# Patient Record
Sex: Female | Born: 1950 | Race: White | Hispanic: No | Smoking: Never smoker
Health system: Southern US, Community
[De-identification: ages and names within clinical notes are randomized; demographics above are authoritative.]

## PROBLEM LIST (undated history)

## (undated) DIAGNOSIS — E785 Hyperlipidemia, unspecified: Secondary | ICD-10-CM

## (undated) DIAGNOSIS — H9190 Unspecified hearing loss, unspecified ear: Secondary | ICD-10-CM

## (undated) DIAGNOSIS — I10 Essential (primary) hypertension: Secondary | ICD-10-CM

## (undated) DIAGNOSIS — K635 Polyp of colon: Secondary | ICD-10-CM

## (undated) HISTORY — DX: Hyperlipidemia, unspecified: E78.5

## (undated) HISTORY — DX: Unspecified hearing loss, unspecified ear: H91.90

## (undated) HISTORY — DX: Essential (primary) hypertension: I10

## (undated) HISTORY — DX: Polyp of colon: K63.5

## (undated) HISTORY — PX: TONSILLECTOMY: SUR1361

## (undated) HISTORY — PX: ABDOMINAL HYSTERECTOMY: SHX81

---

## 1998-12-27 ENCOUNTER — Other Ambulatory Visit: Admission: RE | Admit: 1998-12-27 | Discharge: 1998-12-27 | Payer: Self-pay | Admitting: Family Medicine

## 1999-12-17 ENCOUNTER — Encounter: Admission: RE | Admit: 1999-12-17 | Discharge: 1999-12-17 | Payer: Self-pay | Admitting: Family Medicine

## 1999-12-17 ENCOUNTER — Encounter: Payer: Self-pay | Admitting: Family Medicine

## 2001-01-01 ENCOUNTER — Other Ambulatory Visit: Admission: RE | Admit: 2001-01-01 | Discharge: 2001-01-01 | Payer: Self-pay | Admitting: *Deleted

## 2001-01-01 ENCOUNTER — Other Ambulatory Visit: Admission: RE | Admit: 2001-01-01 | Discharge: 2001-01-01 | Payer: Self-pay | Admitting: Family Medicine

## 2002-02-17 ENCOUNTER — Encounter: Admission: RE | Admit: 2002-02-17 | Discharge: 2002-02-17 | Payer: Self-pay | Admitting: Family Medicine

## 2002-02-17 ENCOUNTER — Encounter: Payer: Self-pay | Admitting: Family Medicine

## 2003-02-21 ENCOUNTER — Encounter: Payer: Self-pay | Admitting: Family Medicine

## 2003-02-21 ENCOUNTER — Encounter: Admission: RE | Admit: 2003-02-21 | Discharge: 2003-02-21 | Payer: Self-pay | Admitting: Family Medicine

## 2004-02-22 ENCOUNTER — Encounter: Admission: RE | Admit: 2004-02-22 | Discharge: 2004-02-22 | Payer: Self-pay | Admitting: Family Medicine

## 2004-03-13 ENCOUNTER — Other Ambulatory Visit: Admission: RE | Admit: 2004-03-13 | Discharge: 2004-03-13 | Payer: Self-pay | Admitting: Family Medicine

## 2004-06-13 ENCOUNTER — Observation Stay (HOSPITAL_COMMUNITY): Admission: RE | Admit: 2004-06-13 | Discharge: 2004-06-14 | Payer: Self-pay | Admitting: Neurosurgery

## 2004-07-05 HISTORY — PX: CERVICAL FUSION: SHX112

## 2005-01-28 ENCOUNTER — Ambulatory Visit: Payer: Self-pay | Admitting: Family Medicine

## 2005-02-22 ENCOUNTER — Encounter: Admission: RE | Admit: 2005-02-22 | Discharge: 2005-02-22 | Payer: Self-pay | Admitting: Family Medicine

## 2005-03-14 ENCOUNTER — Ambulatory Visit: Payer: Self-pay | Admitting: Family Medicine

## 2005-04-03 ENCOUNTER — Encounter: Payer: Self-pay | Admitting: Cardiology

## 2005-04-03 ENCOUNTER — Ambulatory Visit: Payer: Self-pay

## 2005-08-05 ENCOUNTER — Ambulatory Visit: Payer: Self-pay | Admitting: Family Medicine

## 2005-08-05 ENCOUNTER — Ambulatory Visit: Payer: Self-pay | Admitting: Cardiology

## 2005-08-26 ENCOUNTER — Ambulatory Visit: Payer: Self-pay | Admitting: Family Medicine

## 2005-11-21 ENCOUNTER — Ambulatory Visit: Payer: Self-pay | Admitting: Family Medicine

## 2006-02-25 ENCOUNTER — Encounter: Admission: RE | Admit: 2006-02-25 | Discharge: 2006-02-25 | Payer: Self-pay | Admitting: Family Medicine

## 2006-03-06 ENCOUNTER — Encounter: Admission: RE | Admit: 2006-03-06 | Discharge: 2006-03-06 | Payer: Self-pay | Admitting: Family Medicine

## 2006-03-17 ENCOUNTER — Ambulatory Visit: Payer: Self-pay | Admitting: Family Medicine

## 2006-03-26 ENCOUNTER — Encounter: Payer: Self-pay | Admitting: Internal Medicine

## 2006-03-26 ENCOUNTER — Ambulatory Visit: Payer: Self-pay

## 2006-09-22 ENCOUNTER — Ambulatory Visit: Payer: Self-pay | Admitting: Family Medicine

## 2006-09-22 LAB — CONVERTED CEMR LAB
Eosinophil percent: 3.3 % (ref 0.0–5.0)
Hemoglobin: 14.6 g/dL (ref 12.0–15.0)
Monocytes Absolute: 1 10*3/uL — ABNORMAL HIGH (ref 0.2–0.7)
Neutrophils Relative %: 58.6 % (ref 43.0–77.0)
WBC: 12.4 10*3/uL — ABNORMAL HIGH (ref 4.5–10.5)

## 2006-10-02 ENCOUNTER — Ambulatory Visit: Payer: Self-pay | Admitting: Hematology & Oncology

## 2006-10-20 LAB — CBC WITH DIFFERENTIAL/PLATELET
Basophils Absolute: 0 10*3/uL (ref 0.0–0.1)
Eosinophils Absolute: 0.3 10*3/uL (ref 0.0–0.5)
HCT: 40.8 % (ref 34.8–46.6)
HGB: 13.8 g/dL (ref 11.6–15.9)
LYMPH%: 24.5 % (ref 14.0–48.0)
MCV: 92.8 fL (ref 81.0–101.0)
MONO#: 0.8 10*3/uL (ref 0.1–0.9)
NEUT#: 9 10*3/uL — ABNORMAL HIGH (ref 1.5–6.5)
Platelets: 310 10*3/uL (ref 145–400)
RBC: 4.4 10*6/uL (ref 3.70–5.32)
WBC: 13.4 10*3/uL — ABNORMAL HIGH (ref 3.9–10.0)

## 2006-10-20 LAB — CHCC SMEAR

## 2006-12-17 ENCOUNTER — Ambulatory Visit: Payer: Self-pay | Admitting: Hematology & Oncology

## 2007-01-01 LAB — CBC WITH DIFFERENTIAL/PLATELET
Basophils Absolute: 0.1 10*3/uL (ref 0.0–0.1)
EOS%: 2.4 % (ref 0.0–7.0)
HCT: 40.6 % (ref 34.8–46.6)
HGB: 14.1 g/dL (ref 11.6–15.9)
LYMPH%: 26 % (ref 14.0–48.0)
MCH: 31.2 pg (ref 26.0–34.0)
MONO#: 0.8 10*3/uL (ref 0.1–0.9)
NEUT%: 65.1 % (ref 39.6–76.8)
Platelets: 301 10*3/uL (ref 145–400)
lymph#: 3.5 10*3/uL — ABNORMAL HIGH (ref 0.9–3.3)

## 2007-02-27 ENCOUNTER — Encounter: Admission: RE | Admit: 2007-02-27 | Discharge: 2007-02-27 | Payer: Self-pay | Admitting: Family Medicine

## 2007-04-03 DIAGNOSIS — R51 Headache: Secondary | ICD-10-CM

## 2007-04-03 DIAGNOSIS — R519 Headache, unspecified: Secondary | ICD-10-CM | POA: Insufficient documentation

## 2007-04-03 DIAGNOSIS — I10 Essential (primary) hypertension: Secondary | ICD-10-CM | POA: Insufficient documentation

## 2007-04-03 DIAGNOSIS — L259 Unspecified contact dermatitis, unspecified cause: Secondary | ICD-10-CM

## 2007-04-07 ENCOUNTER — Ambulatory Visit: Payer: Self-pay | Admitting: Family Medicine

## 2007-04-07 DIAGNOSIS — D72829 Elevated white blood cell count, unspecified: Secondary | ICD-10-CM | POA: Insufficient documentation

## 2007-04-07 DIAGNOSIS — E7210 Disorders of sulfur-bearing amino-acid metabolism, unspecified: Secondary | ICD-10-CM

## 2007-04-07 DIAGNOSIS — H9190 Unspecified hearing loss, unspecified ear: Secondary | ICD-10-CM | POA: Insufficient documentation

## 2007-04-07 DIAGNOSIS — C44599 Other specified malignant neoplasm of skin of other part of trunk: Secondary | ICD-10-CM | POA: Insufficient documentation

## 2007-04-07 DIAGNOSIS — F329 Major depressive disorder, single episode, unspecified: Secondary | ICD-10-CM

## 2007-04-14 ENCOUNTER — Ambulatory Visit: Payer: Self-pay | Admitting: Family Medicine

## 2007-04-17 LAB — CONVERTED CEMR LAB
BUN: 9 mg/dL (ref 6–23)
Basophils Relative: 1 % (ref 0.0–1.0)
Chloride: 102 meq/L (ref 96–112)
Eosinophils Absolute: 0.3 10*3/uL (ref 0.0–0.6)
Eosinophils Relative: 2.9 % (ref 0.0–5.0)
GFR calc Af Amer: 96 mL/min
HDL: 36.5 mg/dL — ABNORMAL LOW (ref >39.0)
LDL Cholesterol: 97 mg/dL (ref 0–99)
Lymphocytes Relative: 22.5 % (ref 12.0–46.0)
MCV: 92.4 fL (ref 78.0–100.0)
Neutro Abs: 7.7 10*3/uL (ref 1.4–7.7)
Neutrophils Relative %: 67.6 % (ref 43.0–77.0)
Platelets: 226 10*3/uL (ref 150–400)
RBC: 4.54 M/uL (ref 3.87–5.11)
Sodium: 142 meq/L (ref 135–145)
Total Bilirubin: 0.8 mg/dL (ref 0.3–1.2)
Total CHOL/HDL Ratio: 4.6

## 2007-04-30 ENCOUNTER — Telehealth: Payer: Self-pay | Admitting: Family Medicine

## 2007-06-28 ENCOUNTER — Ambulatory Visit: Payer: Self-pay | Admitting: Hematology & Oncology

## 2007-10-08 ENCOUNTER — Ambulatory Visit: Payer: Self-pay | Admitting: Family Medicine

## 2007-10-12 LAB — CONVERTED CEMR LAB
ALT: 20 units/L (ref 0–35)
Albumin: 3.5 g/dL (ref 3.5–5.2)
BUN: 9 mg/dL (ref 6–23)
Calcium: 9.5 mg/dL (ref 8.4–10.5)
Eosinophils Absolute: 0.3 10*3/uL (ref 0.0–0.6)
HCT: 42.1 % (ref 36.0–46.0)
HDL: 40.8 mg/dL (ref >39.0)
LDL Cholesterol: 111 mg/dL — ABNORMAL HIGH (ref 0–99)
Lymphocytes Relative: 26.5 % (ref 12.0–46.0)
Neutro Abs: 7.3 10*3/uL (ref 1.4–7.7)
Neutrophils Relative %: 63.8 % (ref 43.0–77.0)
Platelets: 225 10*3/uL (ref 150–400)
Potassium: 4.8 meq/L (ref 3.5–5.1)
RBC: 4.52 M/uL (ref 3.87–5.11)
Sodium: 144 meq/L (ref 135–145)
Total CHOL/HDL Ratio: 4.4
Triglycerides: 141 mg/dL (ref 0–149)
WBC: 11.3 10*3/uL — ABNORMAL HIGH (ref 4.5–10.5)

## 2007-11-09 ENCOUNTER — Telehealth (INDEPENDENT_AMBULATORY_CARE_PROVIDER_SITE_OTHER): Payer: Self-pay | Admitting: *Deleted

## 2007-11-24 ENCOUNTER — Ambulatory Visit: Payer: Self-pay | Admitting: Family Medicine

## 2007-11-24 DIAGNOSIS — J209 Acute bronchitis, unspecified: Secondary | ICD-10-CM

## 2007-12-11 ENCOUNTER — Ambulatory Visit: Payer: Self-pay | Admitting: Family Medicine

## 2007-12-11 DIAGNOSIS — J0110 Acute frontal sinusitis, unspecified: Secondary | ICD-10-CM

## 2008-02-29 ENCOUNTER — Encounter: Admission: RE | Admit: 2008-02-29 | Discharge: 2008-02-29 | Payer: Self-pay | Admitting: Family Medicine

## 2008-03-01 ENCOUNTER — Encounter (INDEPENDENT_AMBULATORY_CARE_PROVIDER_SITE_OTHER): Payer: Self-pay | Admitting: *Deleted

## 2008-05-04 ENCOUNTER — Ambulatory Visit: Payer: Self-pay | Admitting: Family Medicine

## 2008-05-05 ENCOUNTER — Encounter: Payer: Self-pay | Admitting: Family Medicine

## 2008-05-05 ENCOUNTER — Encounter (INDEPENDENT_AMBULATORY_CARE_PROVIDER_SITE_OTHER): Payer: Self-pay | Admitting: *Deleted

## 2008-05-05 LAB — CONVERTED CEMR LAB
Basophils Relative: 0.5 % (ref 0.0–1.0)
Bilirubin, Direct: 0.1 mg/dL (ref 0.0–0.3)
CO2: 27 meq/L (ref 19–32)
Eosinophils Absolute: 0.3 10*3/uL (ref 0.0–0.7)
GFR calc non Af Amer: 93 mL/min
HCT: 41 % (ref 36.0–46.0)
HDL: 38.7 mg/dL — ABNORMAL LOW (ref >39.0)
Hemoglobin: 14.1 g/dL (ref 12.0–15.0)
Lymphocytes Relative: 28 % (ref 12.0–46.0)
MCHC: 34.3 g/dL (ref 30.0–36.0)
Monocytes Absolute: 0.6 10*3/uL (ref 0.1–1.0)
Monocytes Relative: 6.3 % (ref 3.0–12.0)
Neutrophils Relative %: 62.4 % (ref 43.0–77.0)
Potassium: 4.2 meq/L (ref 3.5–5.1)
RDW: 12.3 % (ref 11.5–14.6)
TSH: 2.11 microintl units/mL (ref 0.35–5.50)
Total Bilirubin: 0.8 mg/dL (ref 0.3–1.2)
Total Protein: 6.6 g/dL (ref 6.0–8.3)
Vit D, 1,25-Dihydroxy: 34 (ref 30–89)

## 2008-08-26 ENCOUNTER — Encounter: Payer: Self-pay | Admitting: Family Medicine

## 2008-08-26 DIAGNOSIS — M79609 Pain in unspecified limb: Secondary | ICD-10-CM | POA: Insufficient documentation

## 2008-09-08 ENCOUNTER — Encounter: Payer: Self-pay | Admitting: Family Medicine

## 2008-09-12 ENCOUNTER — Telehealth (INDEPENDENT_AMBULATORY_CARE_PROVIDER_SITE_OTHER): Payer: Self-pay | Admitting: *Deleted

## 2008-09-14 ENCOUNTER — Ambulatory Visit: Payer: Self-pay | Admitting: Family Medicine

## 2008-10-20 ENCOUNTER — Ambulatory Visit: Payer: Self-pay | Admitting: Family Medicine

## 2008-10-20 DIAGNOSIS — R609 Edema, unspecified: Secondary | ICD-10-CM

## 2008-11-01 ENCOUNTER — Telehealth (INDEPENDENT_AMBULATORY_CARE_PROVIDER_SITE_OTHER): Payer: Self-pay | Admitting: *Deleted

## 2009-01-11 ENCOUNTER — Encounter (INDEPENDENT_AMBULATORY_CARE_PROVIDER_SITE_OTHER): Payer: Self-pay | Admitting: *Deleted

## 2009-01-16 ENCOUNTER — Ambulatory Visit: Payer: Self-pay | Admitting: Family Medicine

## 2009-01-16 DIAGNOSIS — J019 Acute sinusitis, unspecified: Secondary | ICD-10-CM | POA: Insufficient documentation

## 2009-03-01 ENCOUNTER — Encounter: Admission: RE | Admit: 2009-03-01 | Discharge: 2009-03-01 | Payer: Self-pay | Admitting: Family Medicine

## 2009-03-14 ENCOUNTER — Ambulatory Visit: Payer: Self-pay | Admitting: Family Medicine

## 2009-03-14 DIAGNOSIS — F411 Generalized anxiety disorder: Secondary | ICD-10-CM

## 2009-05-16 ENCOUNTER — Ambulatory Visit: Payer: Self-pay | Admitting: Family Medicine

## 2009-05-16 LAB — CONVERTED CEMR LAB
Blood in Urine, dipstick: NEGATIVE
Glucose, Urine, Semiquant: NEGATIVE
Ketones, urine, test strip: NEGATIVE
Nitrite: NEGATIVE
Specific Gravity, Urine: >=1.03
WBC Urine, dipstick: NEGATIVE
pH: 5

## 2009-05-23 ENCOUNTER — Encounter (INDEPENDENT_AMBULATORY_CARE_PROVIDER_SITE_OTHER): Payer: Self-pay | Admitting: *Deleted

## 2009-05-26 ENCOUNTER — Ambulatory Visit: Payer: Self-pay | Admitting: Internal Medicine

## 2009-05-29 ENCOUNTER — Encounter (INDEPENDENT_AMBULATORY_CARE_PROVIDER_SITE_OTHER): Payer: Self-pay | Admitting: *Deleted

## 2009-05-30 ENCOUNTER — Encounter: Payer: Self-pay | Admitting: Family Medicine

## 2009-07-20 ENCOUNTER — Encounter: Payer: Self-pay | Admitting: Family Medicine

## 2010-02-06 ENCOUNTER — Ambulatory Visit: Payer: Self-pay | Admitting: Family Medicine

## 2010-02-06 DIAGNOSIS — E1140 Type 2 diabetes mellitus with diabetic neuropathy, unspecified: Secondary | ICD-10-CM | POA: Insufficient documentation

## 2010-02-06 DIAGNOSIS — E1151 Type 2 diabetes mellitus with diabetic peripheral angiopathy without gangrene: Secondary | ICD-10-CM

## 2010-02-06 DIAGNOSIS — E1165 Type 2 diabetes mellitus with hyperglycemia: Secondary | ICD-10-CM

## 2010-02-13 ENCOUNTER — Telehealth (INDEPENDENT_AMBULATORY_CARE_PROVIDER_SITE_OTHER): Payer: Self-pay | Admitting: *Deleted

## 2010-02-14 ENCOUNTER — Telehealth (INDEPENDENT_AMBULATORY_CARE_PROVIDER_SITE_OTHER): Payer: Self-pay | Admitting: *Deleted

## 2010-03-13 ENCOUNTER — Encounter: Admission: RE | Admit: 2010-03-13 | Discharge: 2010-03-13 | Payer: Self-pay | Admitting: Family Medicine

## 2010-05-10 ENCOUNTER — Other Ambulatory Visit: Admission: RE | Admit: 2010-05-10 | Discharge: 2010-05-10 | Payer: Self-pay | Admitting: Family Medicine

## 2010-05-14 ENCOUNTER — Ambulatory Visit: Payer: Self-pay | Admitting: Family Medicine

## 2010-05-14 DIAGNOSIS — E1169 Type 2 diabetes mellitus with other specified complication: Secondary | ICD-10-CM

## 2010-05-14 DIAGNOSIS — E785 Hyperlipidemia, unspecified: Secondary | ICD-10-CM | POA: Insufficient documentation

## 2010-05-14 DIAGNOSIS — Z780 Asymptomatic menopausal state: Secondary | ICD-10-CM | POA: Insufficient documentation

## 2010-05-14 LAB — CONVERTED CEMR LAB
Glucose, Urine, Semiquant: NEGATIVE
Ketones, urine, test strip: NEGATIVE
Protein, U semiquant: NEGATIVE
Specific Gravity, Urine: 1.02
WBC Urine, dipstick: NEGATIVE

## 2010-05-16 LAB — CONVERTED CEMR LAB: Pap Smear: NEGATIVE

## 2010-05-17 ENCOUNTER — Telehealth (INDEPENDENT_AMBULATORY_CARE_PROVIDER_SITE_OTHER): Payer: Self-pay | Admitting: *Deleted

## 2010-05-24 ENCOUNTER — Encounter (INDEPENDENT_AMBULATORY_CARE_PROVIDER_SITE_OTHER): Payer: Self-pay | Admitting: *Deleted

## 2010-05-28 ENCOUNTER — Encounter (INDEPENDENT_AMBULATORY_CARE_PROVIDER_SITE_OTHER): Payer: Self-pay | Admitting: *Deleted

## 2010-05-28 ENCOUNTER — Ambulatory Visit: Payer: Self-pay | Admitting: Internal Medicine

## 2010-06-04 HISTORY — PX: COLONOSCOPY: SHX174

## 2010-06-12 ENCOUNTER — Ambulatory Visit: Payer: Self-pay | Admitting: Internal Medicine

## 2010-06-14 ENCOUNTER — Encounter: Payer: Self-pay | Admitting: Internal Medicine

## 2010-09-05 ENCOUNTER — Encounter: Admission: RE | Admit: 2010-09-05 | Discharge: 2010-09-05 | Payer: Self-pay | Admitting: Family Medicine

## 2010-09-05 ENCOUNTER — Ambulatory Visit: Payer: Self-pay | Admitting: Family Medicine

## 2010-09-05 DIAGNOSIS — R1011 Right upper quadrant pain: Secondary | ICD-10-CM

## 2010-09-05 LAB — CONVERTED CEMR LAB
Bilirubin Urine: NEGATIVE
Blood in Urine, dipstick: NEGATIVE
Glucose, Urine, Semiquant: NEGATIVE
Nitrite: NEGATIVE

## 2010-09-06 ENCOUNTER — Telehealth: Payer: Self-pay | Admitting: Family Medicine

## 2010-09-07 ENCOUNTER — Telehealth (INDEPENDENT_AMBULATORY_CARE_PROVIDER_SITE_OTHER): Payer: Self-pay | Admitting: *Deleted

## 2010-09-07 LAB — CONVERTED CEMR LAB
AST: 28 units/L (ref 0–37)
Albumin: 3.7 g/dL (ref 3.5–5.2)
BUN: 16 mg/dL (ref 6–23)
Bilirubin, Direct: 0.2 mg/dL (ref 0.0–0.3)
CO2: 31 meq/L (ref 19–32)
Creatinine, Ser: 0.8 mg/dL (ref 0.4–1.2)
Eosinophils Relative: 2.3 % (ref 0.0–5.0)
Glucose, Bld: 88 mg/dL (ref 70–99)
HCT: 41.3 % (ref 36.0–46.0)
Hemoglobin: 13.7 g/dL (ref 12.0–15.0)
Lipase: 45 units/L (ref 11.0–59.0)
Lymphocytes Relative: 27.9 % (ref 12.0–46.0)
Neutro Abs: 7.6 10*3/uL (ref 1.4–7.7)
Neutrophils Relative %: 60.9 % (ref 43.0–77.0)
Platelets: 248 10*3/uL (ref 150.0–400.0)
RBC: 4.51 M/uL (ref 3.87–5.11)
Total Bilirubin: 0.4 mg/dL (ref 0.3–1.2)
Total Protein: 6.9 g/dL (ref 6.0–8.3)
WBC: 12.5 10*3/uL — ABNORMAL HIGH (ref 4.5–10.5)

## 2010-09-11 ENCOUNTER — Ambulatory Visit: Payer: Self-pay | Admitting: Cardiology

## 2010-09-11 ENCOUNTER — Telehealth (INDEPENDENT_AMBULATORY_CARE_PROVIDER_SITE_OTHER): Payer: Self-pay | Admitting: *Deleted

## 2010-09-14 ENCOUNTER — Encounter: Payer: Self-pay | Admitting: Family Medicine

## 2010-09-21 ENCOUNTER — Telehealth: Payer: Self-pay | Admitting: Family Medicine

## 2010-09-21 ENCOUNTER — Ambulatory Visit: Payer: Self-pay | Admitting: Family Medicine

## 2010-09-21 ENCOUNTER — Encounter: Payer: Self-pay | Admitting: Internal Medicine

## 2010-09-21 ENCOUNTER — Telehealth: Payer: Self-pay | Admitting: Internal Medicine

## 2010-09-25 ENCOUNTER — Telehealth: Payer: Self-pay | Admitting: Physician Assistant

## 2010-09-25 ENCOUNTER — Ambulatory Visit: Payer: Self-pay | Admitting: Gastroenterology

## 2010-09-25 DIAGNOSIS — Z860100 Personal history of colon polyps, unspecified: Secondary | ICD-10-CM | POA: Insufficient documentation

## 2010-09-25 DIAGNOSIS — R110 Nausea: Secondary | ICD-10-CM

## 2010-09-25 DIAGNOSIS — E669 Obesity, unspecified: Secondary | ICD-10-CM

## 2010-09-25 DIAGNOSIS — R932 Abnormal findings on diagnostic imaging of liver and biliary tract: Secondary | ICD-10-CM | POA: Insufficient documentation

## 2010-09-26 ENCOUNTER — Telehealth (INDEPENDENT_AMBULATORY_CARE_PROVIDER_SITE_OTHER): Payer: Self-pay | Admitting: *Deleted

## 2010-10-31 ENCOUNTER — Ambulatory Visit (HOSPITAL_COMMUNITY): Admission: RE | Admit: 2010-10-31 | Payer: Self-pay | Source: Home / Self Care | Admitting: Surgery

## 2010-11-06 ENCOUNTER — Telehealth (INDEPENDENT_AMBULATORY_CARE_PROVIDER_SITE_OTHER): Payer: Self-pay | Admitting: *Deleted

## 2010-11-30 ENCOUNTER — Ambulatory Visit: Admit: 2010-11-30 | Payer: Self-pay | Admitting: Internal Medicine

## 2010-12-04 NOTE — Progress Notes (Signed)
Summary: Request Super Bill  Phone Note Call from Patient Call back at Work Phone [phone]   Caller: Patient Reason for Call: Insurance Question Summary of Call: Patient called and request a super bill be faxed to her at work. Faxed on 02/13/10 @ 11:12am to her personal fax.  Initial call taken by: Harold Barban,  February 13, 2010 11:13 AM

## 2010-12-04 NOTE — Letter (Signed)
Summary: Primary Care Appointment Letter  [facility] [place]  [address] [place], [place] [address]   Phone: [phone]  Fax: [phone]    01/11/2009 MRN: [id number]  [facility] [address] [place], [place]  [address]  Dear Ms. Shough,   Your Primary Care Physician Loreen Freud DO has indicated that:    _______it is time to schedule an appointment.    _______you missed your appointment on______ and need to call and          reschedule.    _______you need to have lab work done.    _______you need to schedule an appointment discuss lab or test results.    _______you need to call to reschedule your appointment that is                       scheduled on _________.     Your appointment with Dr. Laury Axon on 05-09-09 has been rescheduled.  Your new appointment date is 05-16-09 @ 8:30 AM.  Our phone number is [phone]          _________. Please press option 1. Our office is open 8a-12noon and 1p-5p, Monday through Friday.     Thank you,    [facility]

## 2010-12-04 NOTE — Progress Notes (Signed)
Summary: Appt w/Dr Samson Frederic  Phone Note From Other Clinic   Caller: Hazel @ [facility]  Call For: Mike Gip, [place] Summary of Call: Returning your call about scheduling pt with Dr Purnell Shoemaker. First available is 10-22-10 at 9:20am pt needs to arrive at [facility]. [id number] she is aware that you wanted patient seen sooner so, she is going to check with MD and call you back.  Initial call taken by: Leanor Kail [facility],  September 25, 2010 4:23 PM  Follow-up for Phone Call        Spoke to  [place] and I asked for something sooner than the 12-19 with Dr. Aleen Campi.  She did have 10-02-10 at the [facility] office with Dr. Luisa Hart. Called the pt with the details and she was satisfied.  Faxed the info to [facility] on [facility] per their request. Follow-up by: Joselyn Glassman,  September 26, 2010 9:13 AM

## 2010-12-04 NOTE — Letter (Signed)
Summary: New Patient letter  [facility]  [address] [place], [place] [address]   Phone: [phone]  Fax: [phone]       09/21/2010 MRN: [id number]  [facility] [address] [place], [place]  [address]  Dear Jacqueline Duran,  Welcome to the Gastroenterology Division at [facility].    You are scheduled to see Dr.  Juanda Chance on 11-28-2010 at 10:30am on the 3rd floor at [facility], [address]  We ask that you try to arrive at ouroffice 15 minutes prior to your appointment time to allow for check-in.  We would like you to complete the enclosed self-administered evaluation form prior to your visit and bring it with you on the day of your appointment.  We will review it with you.  Also, please bring a complete list of all your medications or, if you prefer, bring the medication bottles and we will list them.  Please bring your insurance card so that we may make a copy of it.  If your insurance requires a referral to see a specialist, please bring your referral form from your primary care physician.  Co-payments are due at the time of your visit and may be paid by cash, check or credit card.     Your office visit will consist of a consult with your physician (includes a physical exam), any laboratory testing he/she may order, scheduling of any necessary diagnostic testing (e.g. x-ray, ultrasound, CT-scan), and scheduling of a procedure (e.g. Endoscopy, Colonoscopy) if required.  Please allow enough time on your schedule to allow for any/all of these possibilities.    If you cannot keep your appointment, please call [phone] to cancel or reschedule prior to your appointment date.  This allows [place] the opportunity to schedule an appointment for another patient in need of care.  If you do not cancel or reschedule by 5 p.m. the business day prior to your appointment date, you will be charged a $50.00 late cancellation/no-show fee.    Thank you for choosing [facility] for your medical needs.  We appreciate the opportunity to care for you.  Please visit [place] at our website  to learn more about our practice.                     Sincerely,                                                             The Gastroenterology Division

## 2010-12-04 NOTE — Letter (Signed)
Summary: Miralax Instructions  [facility]  [address]   [place], [place] [address]   Phone: [phone]  Fax: [phone]       KAYDEE MAGEL    October 09, 1951    MRN: [id number]       Procedure Day Dorna Bloom: 06-12-10, Tuesday     Arrival Time: 7:30 a.m.     Procedure Time: 8:30 a.m.     Location of Procedure:                    x   [facility] (4th Floor)    PREPARATION FOR COLONOSCOPY WITH MIRALAX  Starting 5 days prior to your procedure 06-07-10 do not eat nuts, seeds, popcorn, corn, beans, peas,  salads, or any raw vegetables.  Do not take any fiber supplements (e.g. Metamucil, Citrucel, and Benefiber). ____________________________________________________________________________________________________   THE DAY BEFORE YOUR PROCEDURE         DATE:  06-11-10  DAY: Monday  1   Drink clear liquids the entire day-NO SOLID FOOD  2   Do not drink anything colored red or purple.  Avoid juices with pulp.  No orange juice.  3   Drink at least 64 oz. (8 glasses) of fluid/clear liquids during the day to prevent dehydration and help the prep work efficiently.  CLEAR LIQUIDS INCLUDE: Water Jello Ice Popsicles Tea (sugar ok, no milk/cream) Powdered fruit flavored drinks Coffee (sugar ok, no milk/cream) Gatorade Juice: apple, white grape, white cranberry  Lemonade Clear bullion, consomm, broth Carbonated beverages (any kind) Strained chicken noodle soup Hard Candy  4   Mix the entire bottle of Miralax with 64 oz. of Gatorade/Powerade in the morning and put in the refrigerator to chill.  5   At 3:00 pm take 2 Dulcolax/Bisacodyl tablets.  6   At 4:30 pm take one Reglan/Metoclopramide tablet.  7  Starting at 5:00 pm drink one 8 oz glass of the Miralax mixture every 15-20 minutes until you have finished drinking the entire 64 oz.  You should finish drinking prep around 7:30 or 8:00 pm.  8   If you are nauseated, you may take the 2nd Reglan/Metoclopramide  tablet at 6:30 pm.        9    At 8:00 pm take 2 more DULCOLAX/Bisacodyl tablets.     THE DAY OF YOUR PROCEDURE      DATE:   06-12-10  DAY: Tuesday  You may drink clear liquids until 6:30 a.m.  (2 HOURS BEFORE PROCEDURE).   MEDICATION INSTRUCTIONS  Unless otherwise instructed, you should take regular prescription medications with a small sip of water as early as possible the morning of your procedure.  Diabetic patients - see separate instructions.   Additional medication instructions:  Do not take Trim/HCTZ day of procedure.         OTHER INSTRUCTIONS  You will need a responsible adult at least 58 years of age to accompany you and drive you home.   This person must remain in the waiting room during your procedure.  Wear loose fitting clothing that is easily removed.  Leave jewelry and other valuables at home.  However, you may wish to bring a book to read or an iPod/MP3 player to listen to music as you wait for your procedure to start.  Remove all body piercing jewelry and leave at home.  Total time from sign-in until discharge is approximately 2-3 hours.  You should go home directly after your procedure and rest.  You  can resume normal activities the day after your procedure.  The day of your procedure you should not:   Drive   Make legal decisions   Operate machinery   Drink alcohol   Return to work  You will receive specific instructions about eating, activities and medications before you leave.   The above instructions have been reviewed and explained to me by   Ezra Sites RN  May 28, 2010 8:47 AM     I fully understand and can verbalize these instructions _____________________________ Date _______

## 2010-12-04 NOTE — Progress Notes (Signed)
----   Converted from flag ---- ---- 09/05/2010 3:45 PM, Almeta Monas CMA (AAMA) wrote: Call from Radiology in reference to the Abdominal U/S Ecogenic Liver consistent with hepatic steatosis. No gallstones, No focal abnormality, somewhat ecogenic pancreas. ------------------------------  Phone Note From Other Clinic   Summary of Call: see [place] results Initial call taken by: Loreen Freud DO,  September 06, 2010 8:49 AM

## 2010-12-04 NOTE — Progress Notes (Signed)
Summary: Results  Phone Note Outgoing Call   Call placed by: Almeta Monas CMA Duncan Dull),  September 11, 2010 11:47 AM Call placed to: Patient Details for Reason: Results Summary of Call: no explanation for RUQ pain if pain con't refer to GI   Spk with pt and gave her the above results.Marland KitchenMarland KitchenMarland KitchenPer pt still having pain but wants to wait a few days to see if any improvement before she goes to see the GI doctor. Adv pt to call if she changes her mind..... Almeta Monas CMA Duncan Dull)  September 11, 2010 11:47 AM

## 2010-12-04 NOTE — Procedures (Signed)
Summary: Colonoscopy  Patient: Jacqueline Duran Note: All result statuses are Final unless otherwise noted.  Tests: (1) Colonoscopy (COL)   COL Colonoscopy           DONE     [facility]     [address]     [place], [place]  [address]           COLONOSCOPY PROCEDURE REPORT           PATIENT:  [name], [name]  MR#:  [id number]     BIRTHDATE:  1951/07/05, 58 yrs. old  GENDER:  female     ENDOSCOPIST:  Hedwig Morton. Juanda Chance, MD     REF. BY:  Loreen Freud, DO     PROCEDURE DATE:  06/12/2010     PROCEDURE:  Colonoscopy [address]     ASA CLASS:  Class I     INDICATIONS:  Routine Risk Screening     MEDICATIONS:   Versed 8 mg, Fentanyl 75 mcg           DESCRIPTION OF PROCEDURE:   After the risks benefits and     alternatives of the procedure were thoroughly explained, informed     consent was obtained.  Digital rectal exam was performed and     revealed no rectal masses.   The LB CF-[id number] endoscope     was introduced through the anus and advanced to the cecum, which     was identified by both the appendix and ileocecal valve, without     limitations.  The quality of the prep was good, using MiraLax.     The instrument was then slowly withdrawn as the colon was fully     examined.     <<PROCEDUREIMAGES>>           FINDINGS:  Two polyps were found in the rectum and sigmoid colon.     at 5 and 1-0 cm 3-4 mm sessile polyps removed The polyps were     removed using cold biopsy forceps (see image4 and image5).  This     was otherwise a normal examination of the colon (see image6,     image3, image2, and image1).   Retroflexed views in the rectum     revealed no abnormalities.    The scope was then withdrawn from     the patient and the procedure completed.           COMPLICATIONS:  None     ENDOSCOPIC IMPRESSION:     1) Two polyps in the rectum and sigmoid colon     2) Otherwise normal examination     RECOMMENDATIONS:     1) Await pathology results     2) High fiber diet.  REPEAT EXAM:  In 5 - 7 year(s) for.  recall pending results of the     polyps           ______________________________     Hedwig Morton. Juanda Chance, MD           CC:           n.     eSIGNED:   Hedwig Morton. Brodie at 06/12/2010 08:33 AM           Thomes Lolling, [id number]  Note: An exclamation mark (!) indicates a result that was not dispersed into the flowsheet. Document Creation Date: 06/12/2010 8:33 AM _______________________________________________________________________  (1) Order result status: Final Collection or observation date-time: 06/12/2010 08:24 Requested date-time:  Receipt date-time:  Reported date-time:  Referring Physician:   Ordering Physician: Lina Sar [phone]) Specimen Source:  Source: Launa Grill Order Number: [id number] Lab site:   Appended Document: Colonoscopy     Procedures Next Due Date:    Colonoscopy: 06/2020

## 2010-12-04 NOTE — Medication Information (Signed)
Summary: Noncompliant with [employer]  Noncompliant with [employer]   Imported By: Lanelle Bal 09/28/2010 12:44:11  _____________________________________________________________________  External Attachment:    Type:   Image     Comment:   External Document

## 2010-12-04 NOTE — Assessment & Plan Note (Signed)
Summary: cpx/kdc   Vital Signs:  Patient profile:   60 year old female Height:      64.5 inches Weight:      267 pounds Temp:     98.5 degrees F oral Pulse rate:   60 / minute Resp:     18 per minute BP sitting:   130 / 86  (left arm)  Vitals Entered By: Jeremy Johann CMA (May 14, 2010 9:04 AM) CC: cpx,fasting, pap Comments REVIEWED MED LIST, PATIENT AGREED DOSE AND INSTRUCTION CORRECT    History of Present Illness: Pt here for cpe, pap and labs.  Colon scheduled for next month.  Type 1 diabetes mellitus follow-up      This is a 60 year old woman who presents with Type 2 diabetes mellitus follow-up.  The patient denies polyuria, polydipsia, blurred vision, self managed hypoglycemia, hypoglycemia requiring help, weight loss, weight gain, and numbness of extremities.  The patient denies the following symptoms: neuropathic pain, chest pain, vomiting, orthostatic symptoms, poor wound healing, intermittent claudication, vision loss, and foot ulcer.  Since the last visit the patient reports good dietary compliance, compliance with medications, exercising regularly, and not monitoring blood glucose.  Since the last visit, the patient reports having had eye care by an ophthalmologist and foot care by a podiatrist.    Hyperlipidemia follow-up      The patient also presents for Hyperlipidemia follow-up.  The patient denies muscle aches, GI upset, abdominal pain, flushing, itching, constipation, diarrhea, and fatigue.  The patient denies the following symptoms: chest pain/pressure, exercise intolerance, dypsnea, palpitations, syncope, and pedal edema.  Compliance with medications (by patient report) has been near 100%.  Dietary compliance has been good.  The patient reports exercising occasionally.  Adjunctive measures currently used by the patient include fiber, ASA, fish oil supplements, limiting alcohol consumpton, weight reduction, and Co-QA.    Hypertension follow-up      The patient also  presents for Hypertension follow-up.  The patient denies lightheadedness, urinary frequency, headaches, edema, impotence, rash, and fatigue.  The patient denies the following associated symptoms: chest pain, chest pressure, exercise intolerance, dyspnea, palpitations, syncope, leg edema, and pedal edema.  Compliance with medications (by patient report) has been near 100%.  The patient reports that dietary compliance has been good.  The patient reports exercising occasionally.  Adjunctive measures currently used by the patient include salt restriction.    Preventive Screening-Counseling & Management  Alcohol-Tobacco     Alcohol drinks/day: <1     Alcohol type: wine or mixed drink     >5/day in last 3 mos: no     Smoking Status: never  Caffeine-Diet-Exercise     Caffeine use/day: 2     Does Patient Exercise: yes     Type of exercise: walk     Times/week: <3     Exercise Counseling: to improve exercise regimen  Hep-HIV-STD-Contraception     Dental Visit-last 6 months no     Dental Care Counseling: to seek dental care; no dental care within six months     SBE monthly: yes     SBE Education/Counseling: not indicated; SBE done regularly     Sun Exposure-Excessive: rarely  Safety-Violence-Falls     Seat Belt Use: yes     Violence in the Home: no risk noted     Sexual Abuse: no      Sexual History:  currently monogamous.    Current Medications (verified): 1)  Folic Acid 1 Mg Tabs (Folic Acid) .Marland KitchenMarland KitchenMarland Kitchen  1 Once Daily 2)  Triamterene-Hctz 37.5-25 Mg Tabs (Triamterene-Hctz) .Marland Kitchen.. 1 Two Times A Day 3)  Ascorbic Acid 500 Mg Tabs (Ascorbic Acid) .Marland Kitchen.. 1 Once Daily 4)  Potassium 99 Mg Tabs (Potassium) .Marland Kitchen.. 1 Once Daily 5)  Multivitamins  Caps (Multiple Vitamin) .Marland Kitchen.. 1 Once Daily 6)  Coq10 50 Gm Powd (Coenzyme Q10) 7)  Aspirin 81 Mg Tabs (Aspirin) .Marland Kitchen.. 1 Once Daily 8)  Vitamin D 400 Unit Caps (Cholecalciferol) .... 3 Once Daily 9)  Fish Oil Triple Strength 1400 Mg Caps (Omega-3 Fatty Acids) .... Once  Daily 10)  Lexapro 20 Mg Tabs (Escitalopram Oxalate) .Marland Kitchen.. 1 By Mouth Once Daily 11)  Fosinopril Sodium 20 Mg Tabs (Fosinopril Sodium) .... Take Two Times A Day 12)  Nasonex 50 Mcg/act Susp (Mometasone Furoate) .... 2 Sprays Each Nostril Once Daily 13)  Kombiglyze Xr 03-999 Mg Xr24h-Tab (Saxagliptin-Metformin) .Marland Kitchen.. 1 By Mouth Once Daily. 14)  Onetouch Delica Lancets  Misc (Lancets) .... Acc U Check Two Times A Day 15)  One Touch Ultra Mini Strips .... Accu Check Bid 16)  Antara 130 Mg Caps (Fenofibrate Micronized) .Marland Kitchen.. 1 By Mouth Daily.  Allergies: 1)  ! Norvasc  Past History:  Past Surgical History: Last updated: 04/07/2007 Hysterectomy (1985) Tonsillectomy Cervical fusion (07/2004)- cabbell  Family History: Last updated: 05/16/2009 Family History Lung cancer--  F Family History MI Family History of Thromboembolism clotting disorder--- factor V ---sister and niece Family History of Alcoholism/Addiction Family History of CAD Female 1st degree relative--Mother Family History of Stroke F relative <60 Family History of Stroke M relative <50 Muncle--leukemia  Social History: Last updated: 05/04/2008 Occupation: Animal nutritionist- IT dept Married Never Smoked Alcohol use-no Drug use-no Regular exercise-no  Risk Factors: Alcohol Use: <1 (05/14/2010) >5 drinks/d w/in last 3 months: no (05/14/2010) Caffeine Use: 2 (05/14/2010) Exercise: yes (05/14/2010)  Risk Factors: Smoking Status: never (05/14/2010)  Past Medical History: Headache Hypertension Deaf Right Ear Hyperlipidemia  Family History: Reviewed history from 05/16/2009 and no changes required. Family History Lung cancer--  F Family History MI Family History of Thromboembolism clotting disorder--- factor V ---sister and niece Family History of Alcoholism/Addiction Family History of CAD Female 1st degree relative--Mother Family History of Stroke F relative <60 Family History of Stroke M relative  <50 Muncle--leukemia  Social History: Reviewed history from 05/04/2008 and no changes required. Occupation: Animal nutritionist- IT dept Married Never Smoked Alcohol use-no Drug use-no Regular exercise-no  Review of Systems      See HPI General:  Denies chills, fatigue, fever, loss of appetite, malaise, sleep disorder, sweats, weakness, and weight loss. Eyes:  Denies blurring, discharge, double vision, eye irritation, eye pain, halos, itching, light sensitivity, red eye, vision loss-1 eye, and vision loss-both eyes; optho q1y. ENT:  Denies decreased hearing, difficulty swallowing, ear discharge, earache, hoarseness, nasal congestion, nosebleeds, postnasal drainage, ringing in ears, sinus pressure, and sore throat. CV:  Denies bluish discoloration of lips or nails, chest pain or discomfort, difficulty breathing at night, difficulty breathing while lying down, fainting, fatigue, leg cramps with exertion, lightheadness, near fainting, palpitations, shortness of breath with exertion, swelling of feet, swelling of hands, and weight gain. Resp:  Denies chest discomfort, chest pain with inspiration, cough, coughing up blood, excessive snoring, hypersomnolence, morning headaches, pleuritic, shortness of breath, sputum productive, and wheezing. GI:  Denies abdominal pain, bloody stools, change in bowel habits, constipation, dark tarry stools, diarrhea, excessive appetite, gas, hemorrhoids, indigestion, loss of appetite, and nausea. GU:  Denies abnormal vaginal bleeding, decreased libido, discharge,  dysuria, genital sores, hematuria, incontinence, nocturia, urinary frequency, and urinary hesitancy. MS:  Denies joint pain, joint redness, joint swelling, loss of strength, low back pain, mid back pain, muscle aches, muscle , cramps, muscle weakness, stiffness, and thoracic pain. Derm:  Denies changes in color of skin, changes in nail beds, dryness, excessive perspiration, flushing, hair loss, insect bite(s),  itching, lesion(s), poor wound healing, and rash. Neuro:  Denies brief paralysis, difficulty with concentration, disturbances in coordination, falling down, headaches, inability to speak, memory loss, numbness, poor balance, seizures, sensation of room spinning, tingling, tremors, visual disturbances, and weakness. Psych:  Denies alternate hallucination ( auditory/visual), anxiety, depression, easily angered, easily tearful, irritability, mental problems, panic attacks, sense of great danger, suicidal thoughts/plans, thoughts of violence, unusual visions or sounds, and thoughts /plans of harming others. Endo:  Denies cold intolerance, excessive hunger, excessive thirst, excessive urination, heat intolerance, polyuria, and weight change. Heme:  Denies abnormal bruising, bleeding, enlarge lymph nodes, fevers, pallor, and skin discoloration. Allergy:  Denies hives or rash, itching eyes, persistent infections, seasonal allergies, and sneezing.  Physical Exam  General:  Well-developed,well-nourished,in no acute distress; alert,appropriate and cooperative throughout examinationoverweight-appearing.   Head:  Normocephalic and atraumatic without obvious abnormalities. No apparent alopecia or balding. Eyes:  vision grossly intact, pupils equal, pupils round, pupils reactive to light, and no injection.   Ears:  External ear exam shows no significant lesions or deformities.  Otoscopic examination reveals clear canals, tympanic membranes are intact bilaterally without bulging, retraction, inflammation or discharge. Hearing is grossly normal bilaterally. Nose:  External nasal examination shows no deformity or inflammation. Nasal mucosa are pink and moist without lesions or exudates. Mouth:  Oral mucosa and oropharynx without lesions or exudates.  Teeth in good repair. Neck:  No deformities, masses, or tenderness noted.no carotid bruits.   Chest Wall:  No deformities, masses, or tenderness noted. Breasts:  No  mass, nodules, thickening, tenderness, bulging, retraction, inflamation, nipple discharge or skin changes noted.   Lungs:  Normal respiratory effort, chest expands symmetrically. Lungs are clear to auscultation, no crackles or wheezes. Heart:  normal rate and no murmur.   Abdomen:  Bowel sounds positive,abdomen soft and non-tender without masses, organomegaly or hernias noted. Rectal:  No external abnormalities noted. Normal sphincter tone. No rectal masses or tenderness. Genitalia:  normal introitus, no external lesions, no vaginal discharge, mucosa pink and moist, no vaginal atrophy, no friaility or hemorrhage, and no adnexal masses or tenderness.   Msk:  No deformity or scoliosis noted of thoracic or lumbar spine.   Pulses:  R and L carotid,radial,femoral,dorsalis pedis and posterior tibial pulses are full and equal bilaterally Extremities:  No clubbing, cyanosis, edema, or deformity noted with normal full range of motion of all joints.   Neurologic:  No cranial nerve deficits noted. Station and gait are normal. Plantar reflexes are down-going bilaterally. DTRs are symmetrical throughout. Sensory, motor and coordinative functions appear intact. Skin:  Intact without suspicious lesions or rashes Cervical Nodes:  No lymphadenopathy noted Axillary Nodes:  No palpable lymphadenopathy Psych:  Cognition and judgment appear intact. Alert and cooperative with normal attention span and concentration. No apparent delusions, illusions, hallucinations  Diabetes Management Exam:    Foot Exam (with socks and/or shoes not present):       Sensory-Pinprick/Light touch:          Left medial foot (L-4): normal          Left dorsal foot (L-5): normal  Left lateral foot (S-1): normal          Right medial foot (L-4): normal          Right dorsal foot (L-5): normal          Right lateral foot (S-1): normal       Sensory-Monofilament:          Left foot: absent          Right foot: absent        Inspection:          Left foot: normal          Right foot: normal       Nails:          Left foot: normal          Right foot: normal    Eye Exam:       Eye Exam done elsewhere          Date: 11/15/2009          Results: normal          Done by: Dr Alfredo Bach   Impression & Recommendations:  Problem # 1:  PREVENTIVE HEALTH CARE (ICD-V70.0)  Orders: Venipuncture ([address]) TLB-Lipid Panel (80061-LIPID) TLB-BMP (Basic Metabolic Panel-BMET) (80048-METABOL) TLB-CBC Platelet - w/Differential (85025-CBCD) TLB-Hepatic/Liver Function Pnl (80076-HEPATIC) TLB-TSH (Thyroid Stimulating Hormone) (84443-TSH) TLB-A1C / Hgb A1C (Glycohemoglobin) (83036-A1C) TLB-Microalbumin/Creat Ratio, Urine (82043-MALB) EKG w/ Interpretation (93000) UA Dipstick w/o Micro (manual) (60[address])  Problem # 2:  DIABETES MELLITUS, TYPE II (ICD-250.00)  Her updated medication list for this problem includes:    Aspirin 81 Mg Tabs (Aspirin) .Marland Kitchen... 1 once daily    Fosinopril Sodium 20 Mg Tabs (Fosinopril sodium) .Marland Kitchen... Take two times a day    Kombiglyze Xr 03-999 Mg Xr24h-tab (Saxagliptin-metformin) .Marland Kitchen... 1 by mouth once daily.  Orders: Venipuncture ([address]) TLB-Lipid Panel (80061-LIPID) TLB-BMP (Basic Metabolic Panel-BMET) (80048-METABOL) TLB-CBC Platelet - w/Differential (85025-CBCD) TLB-Hepatic/Liver Function Pnl (80076-HEPATIC) TLB-TSH (Thyroid Stimulating Hormone) (84443-TSH) TLB-A1C / Hgb A1C (Glycohemoglobin) (83036-A1C) TLB-Microalbumin/Creat Ratio, Urine (82043-MALB) EKG w/ Interpretation (93000)  Labs Reviewed: Creat: 0.7 (05/04/2008)     Last Eye Exam: normal (11/15/2009) Reviewed HgBA1c results: 6.5 (10/08/2007)  Problem # 3:  HYPERTENSION (ICD-401.9)  Her updated medication list for this problem includes:    Triamterene-hctz 37.5-25 Mg Tabs (Triamterene-hctz) .Marland Kitchen... 1 two times a day    Fosinopril Sodium 20 Mg Tabs (Fosinopril sodium) .Marland Kitchen... Take two times a day  Orders: Venipuncture  (91478) TLB-Lipid Panel (80061-LIPID) TLB-BMP (Basic Metabolic Panel-BMET) (80048-METABOL) TLB-CBC Platelet - w/Differential (85025-CBCD) TLB-Hepatic/Liver Function Pnl (80076-HEPATIC) TLB-TSH (Thyroid Stimulating Hormone) (84443-TSH) TLB-A1C / Hgb A1C (Glycohemoglobin) (83036-A1C) TLB-Microalbumin/Creat Ratio, Urine (82043-MALB) EKG w/ Interpretation (93000)  BP today: 130/86 Prior BP: 138/80 (02/06/2010)  Labs Reviewed: K+: 4.2 (05/04/2008) Creat: : 0.7 (05/04/2008)   Chol: 154 (05/04/2008)   HDL: 38.7 (05/04/2008)   LDL: 85 (05/04/2008)   TG: 151 (05/04/2008)  Problem # 4:  HYPERLIPIDEMIA (ICD-272.4)  Her updated medication list for this problem includes:    Antara 130 Mg Caps (Fenofibrate micronized) .Marland Kitchen... 1 by mouth daily.  Labs Reviewed: SGOT: 26 (05/04/2008)   SGPT: 22 (05/04/2008)   HDL:38.7 (05/04/2008), 40.8 (10/08/2007)  LDL:85 (05/04/2008), 111 (29/56/2130)  Chol:154 (05/04/2008), 180 (10/08/2007)  Trig:151 (05/04/2008), 141 (10/08/2007)  Orders: EKG w/ Interpretation (93000)  Complete Medication List: 1)  Folic Acid 1 Mg Tabs (Folic acid) .Marland Kitchen.. 1 once daily 2)  Triamterene-hctz 37.5-25 Mg Tabs (Triamterene-hctz) .Marland Kitchen.. 1 two times a day 3)  Ascorbic Acid  500 Mg Tabs (Ascorbic acid) .Marland Kitchen.. 1 once daily 4)  Potassium 99 Mg Tabs (Potassium) .Marland Kitchen.. 1 once daily 5)  Multivitamins Caps (Multiple vitamin) .Marland Kitchen.. 1 once daily 6)  Coq10 50 Gm Powd (Coenzyme q10) 7)  Aspirin 81 Mg Tabs (Aspirin) .Marland Kitchen.. 1 once daily 8)  Vitamin D 400 Unit Caps (Cholecalciferol) .... 3 once daily 9)  Fish Oil Triple Strength 1400 Mg Caps (Omega-3 fatty acids) .... Once daily 10)  Lexapro 20 Mg Tabs (Escitalopram oxalate) .Marland Kitchen.. 1 by mouth once daily 11)  Fosinopril Sodium 20 Mg Tabs (Fosinopril sodium) .... Take two times a day 12)  Nasonex 50 Mcg/act Susp (Mometasone furoate) .... 2 sprays each nostril once daily 13)  Kombiglyze Xr 03-999 Mg Xr24h-tab (Saxagliptin-metformin) .Marland Kitchen.. 1 by mouth once  daily. 14)  Onetouch Delica Lancets Misc (Lancets) .... Acc u check two times a day 15)  One Touch Ultra Mini Strips  .... Accu check bid 16)  Antara 130 Mg Caps (Fenofibrate micronized) .Marland Kitchen.. 1 by mouth daily.  Other Orders: Radiology Referral (Radiology) Prescriptions: ANTARA 130 MG CAPS (FENOFIBRATE MICRONIZED) 1 by mouth daily.  #30 x 5   Entered and Authorized by:   Loreen Freud DO   Signed by:   Loreen Freud DO on 05/14/2010   Method used:   Electronically to        [facility] (retail)       [address]       [place], [place]  [address]       Ph: [phone]       Fax: [phone]   RxID:   [id number] KOMBIGLYZE XR 03-999 MG XR24H-TAB (SAXAGLIPTIN-METFORMIN) 1 by mouth once daily.  #30 x 5   Entered and Authorized by:   Loreen Freud DO   Signed by:   Loreen Freud DO on 05/14/2010   Method used:   Electronically to        [facility] (retail)       [address]       [place], [place]  [address]       Ph: [phone]       Fax: [phone]   RxID:   [id number] NASONEX 50 MCG/ACT SUSP (MOMETASONE FUROATE) 2 sprays each nostril once daily  #1 x 5   Entered and Authorized by:   Loreen Freud DO   Signed by:   Loreen Freud DO on 05/14/2010   Method used:   Electronically to        [facility] (retail)       [address]       [place], [place]  [address]       Ph: [phone]       Fax: [phone]   RxID:   [id number] FOSINOPRIL SODIUM 20 MG TABS (FOSINOPRIL SODIUM) TAKE two times a day  #180 x 3   Entered and Authorized by:   Loreen Freud DO   Signed by:   Loreen Freud DO on 05/14/2010   Method used:   Electronically to        [facility] (retail)       [address]       [place], [place]  [address]       Ph: [phone]       Fax:  [phone]   RxID:   [id number] LEXAPRO 20 MG TABS  (ESCITALOPRAM OXALATE) 1 by mouth once daily  #30 x 11   Entered and Authorized by:   Loreen Freud DO   Signed by:   Loreen Freud DO on 05/14/2010   Method used:   Electronically to        [facility] (retail)       [address]       [place], [place]  [address]       Ph: [phone]       Fax: [phone]   RxID:   [id number] TRIAMTERENE-HCTZ 37.5-25 MG TABS (TRIAMTERENE-HCTZ) 1 two times a day  #180 x 3   Entered and Authorized by:   Loreen Freud DO   Signed by:   Loreen Freud DO on 05/14/2010   Method used:   Electronically to        [facility] (retail)       [address]       [place], [place]  [address]       Ph: [phone]       Fax: [phone]   RxID:   [id number] FOLIC ACID 1 MG TABS (FOLIC ACID) 1 once daily  #90 x 3   Entered and Authorized by:   Loreen Freud DO   Signed by:   Loreen Freud DO on 05/14/2010   Method used:   Electronically to        [facility] (retail)       [address]       [place], [place]  [address]       Ph: [phone]       Fax: [phone]   RxID:   [id number]    EKG  Procedure date:  05/14/2010  Findings:      Sinus bradycardia with rate of:  57       Prevention & Chronic Care Immunizations   Influenza vaccine: Historical  (07/19/2009)   Influenza vaccine due: 07/19/2010    Tetanus booster: 05/16/2009: Tdap   Tetanus booster due: 05/17/2019    Pneumococcal vaccine: Not documented  Colorectal Screening   Hemoccult: Not documented    Colonoscopy: Not documented  Other Screening   Pap smear: Not documented    Mammogram: ASSESSMENT: Negative - BI-RADS 1^MM DIGITAL SCREENING  (03/13/2010)   Mammogram due: 03/14/2011   Smoking status: never  (05/14/2010)  Diabetes Mellitus   HgbA1C: 6.5  (10/08/2007)   Hemoglobin A1C due: 01/06/2008    Eye exam: normal  (11/15/2009)   Eye  exam due: 11/2010    Foot exam: yes  (05/14/2010)   High risk foot: Not documented   Foot care education: Not documented   Foot exam due: 02/07/2011    Urine microalbumin/creatinine ratio: Not documented  Lipids   Total Cholesterol: 154  (05/04/2008)   LDL: 85  (05/04/2008)   LDL Direct: Not documented   HDL: 38.7  (05/04/2008)   Triglycerides: 151  (05/04/2008)    SGOT (AST): 26  (05/04/2008)   SGPT (ALT): 22  (05/04/2008)   Alkaline phosphatase: 85  (05/04/2008)   Total bilirubin: 0.8  (05/04/2008)  Hypertension   Last Blood Pressure: 130 / 86  (05/14/2010)   Serum creatinine: 0.7  (05/04/2008)   Serum potassium 4.2  (05/04/2008)  Self-Management Support :  Diabetes self-management support: Not documented    Hypertension self-management support: Not documented    Lipid self-management support: Not documented       Laboratory Results   Urine Tests   Date/Time Reported: May 14, 2010 11:14 AM   Routine Urinalysis   Color: yellow Appearance: Clear Glucose: negative   (Normal Range: Negative) Bilirubin: negative   (Normal Range: Negative) Ketone: negative   (Normal Range: Negative) Spec. Gravity: 1.020   (Normal Range: 1.003-1.035) Blood: negative   (Normal Range: Negative) pH: 5.0   (Normal Range: 5.0-8.0) Protein: negative   (Normal Range: Negative) Urobilinogen: negative   (Normal Range: 0-1) Nitrite: negative   (Normal Range: Negative) Leukocyte Esterace: negative   (Normal Range: Negative)    Comments: Floydene Flock  May 14, 2010 11:15 AM

## 2010-12-04 NOTE — Assessment & Plan Note (Signed)
Summary: POSSIBLE GALLBLADDER ATTACK/STONES/ABD PAIN/KB   Vital Signs:  Patient profile:   60 year old female Weight:      258.2 pounds Temp:     99.0 degrees F oral Pulse rate:   60 / minute Pulse rhythm:   regular BP sitting:   114 / 72  (right arm) Cuff size:   large  Vitals Entered By: Almeta Monas CMA Duncan Dull) (September 05, 2010 11:20 AM) CC: x2days c/o right sided abdominal pain 4/10 today---worst yesterday, Abdominal Pain Pain Assessment Patient in pain? yes     Location: abdomen Intensity: 4 Type: aching Onset of pain  Sudden   History of Present Illness:       This is a 60 year old woman who presents with Abdominal Pain.  The symptoms began 3 days ago.  Pt here c/o RUQ pain since eating fried chicken Monday night.  The patient reports nausea, but denies vomiting, diarrhea, constipation, melena, hematochezia, anorexia, and hematemesis.  The location of the pain is right upper quadrant.  The pain is described as constant and sharp.  The patient denies the following symptoms: fever, weight loss, dysuria, chest pain, jaundice, dark urine, missed menstrual period, and vaginal bleeding.  The pain is worse with food.    Current Medications (verified): 1)  Folic Acid 1 Mg Tabs (Folic Acid) .Marland Kitchen.. 1 Once Daily 2)  Triamterene-Hctz 37.5-25 Mg Tabs (Triamterene-Hctz) .Marland Kitchen.. 1 Two Times A Day 3)  Ascorbic Acid 500 Mg Tabs (Ascorbic Acid) .Marland Kitchen.. 1 Once Daily 4)  Potassium 99 Mg Tabs (Potassium) .Marland Kitchen.. 1 Once Daily 5)  Multivitamins  Caps (Multiple Vitamin) .Marland Kitchen.. 1 Once Daily 6)  Coq10 50 Gm Powd (Coenzyme Q10) 7)  Aspirin 81 Mg Tabs (Aspirin) .Marland Kitchen.. 1 Once Daily 8)  Vitamin D 400 Unit Caps (Cholecalciferol) .... 3 Once Daily 9)  Fish Oil Triple Strength 1400 Mg Caps (Omega-3 Fatty Acids) .... Once Daily 10)  Lexapro 20 Mg Tabs (Escitalopram Oxalate) .Marland Kitchen.. 1 By Mouth Once Daily 11)  Fosinopril Sodium 20 Mg Tabs (Fosinopril Sodium) .... Take Two Times A Day 12)  Nasonex 50 Mcg/act Susp  (Mometasone Furoate) .... 2 Sprays Each Nostril Once Daily 13)  Kombiglyze Xr 03-999 Mg Xr24h-Tab (Saxagliptin-Metformin) .Marland Kitchen.. 1 By Mouth Once Daily. 14)  Onetouch Delica Lancets  Misc (Lancets) .... Acc U Check Two Times A Day 15)  One Touch Ultra Mini Strips .... Accu Check Bid 16)  Antara 130 Mg Caps (Fenofibrate Micronized) .Marland Kitchen.. 1 By Mouth Daily. 17)  Lipitor 10 Mg Tabs (Atorvastatin Calcium) .... Take 1 By Mouth Once Daily  Allergies (verified): 1)  ! Norvasc  Physical Exam  General:  Well-developed,well-nourished,in no acute distress; alert,appropriate and cooperative throughout examination Lungs:  Normal respiratory effort, chest expands symmetrically. Lungs are clear to auscultation, no crackles or wheezes. Abdomen:  RUQ tenderness soft, no distention, no masses, no guarding, and no rebound tenderness.     Impression & Recommendations:  Problem # 1:  RUQ PAIN (ICD-789.01) check labs check [place] abd if symptoms worsen --go to ER Discussed symptom control with the patient.   Complete Medication List: 1)  Folic Acid 1 Mg Tabs (Folic acid) .Marland Kitchen.. 1 once daily 2)  Triamterene-hctz 37.5-25 Mg Tabs (Triamterene-hctz) .Marland Kitchen.. 1 two times a day 3)  Ascorbic Acid 500 Mg Tabs (Ascorbic acid) .Marland Kitchen.. 1 once daily 4)  Potassium 99 Mg Tabs (Potassium) .Marland Kitchen.. 1 once daily 5)  Multivitamins Caps (Multiple vitamin) .Marland Kitchen.. 1 once daily 6)  Coq10 50 Gm Powd (Coenzyme  q10) 7)  Aspirin 81 Mg Tabs (Aspirin) .Marland Kitchen.. 1 once daily 8)  Vitamin D 400 Unit Caps (Cholecalciferol) .... 3 once daily 9)  Fish Oil Triple Strength 1400 Mg Caps (Omega-3 fatty acids) .... Once daily 10)  Lexapro 20 Mg Tabs (Escitalopram oxalate) .Marland Kitchen.. 1 by mouth once daily 11)  Fosinopril Sodium 20 Mg Tabs (Fosinopril sodium) .... Take two times a day 12)  Nasonex 50 Mcg/act Susp (Mometasone furoate) .... 2 sprays each nostril once daily 13)  Kombiglyze Xr 03-999 Mg Xr24h-tab (Saxagliptin-metformin) .Marland Kitchen.. 1 by mouth once daily. 14)   Onetouch Delica Lancets Misc (Lancets) .... Acc u check two times a day 15)  One Touch Ultra Mini Strips  .... Accu check bid 16)  Antara 130 Mg Caps (Fenofibrate micronized) .Marland Kitchen.. 1 by mouth daily. 17)  Lipitor 10 Mg Tabs (Atorvastatin calcium) .... Take 1 by mouth once daily 18)  Prilosec Otc 20 Mg Tbec (Omeprazole magnesium) .Marland Kitchen.. 1 by mouth once daily  Other Orders: Venipuncture (69629) TLB-BMP (Basic Metabolic Panel-BMET) (80048-METABOL) TLB-CBC Platelet - w/Differential (85025-CBCD) TLB-Hepatic/Liver Function Pnl (80076-HEPATIC) TLB-Amylase (82150-AMYL) TLB-Lipase (83690-LIPASE) Radiology Referral (Radiology) Specimen Handling (52841)   Orders Added: 1)  Venipuncture [36415] 2)  TLB-BMP (Basic Metabolic Panel-BMET) [80048-METABOL] 3)  TLB-CBC Platelet - w/Differential [85025-CBCD] 4)  TLB-Hepatic/Liver Function Pnl [80076-HEPATIC] 5)  TLB-Amylase [82150-AMYL] 6)  TLB-Lipase [83690-LIPASE] 7)  Radiology Referral [Radiology] 8)  Specimen Handling [99000] 9)  Est. Patient Level III [32440]    Laboratory Results   Urine Tests    Routine Urinalysis   Color: yellow Appearance: Clear Glucose: negative   (Normal Range: Negative) Bilirubin: negative   (Normal Range: Negative) Ketone: negative   (Normal Range: Negative) Spec. Gravity: 1.015   (Normal Range: 1.003-1.035) Blood: negative   (Normal Range: Negative) pH: 8.0   (Normal Range: 5.0-8.0) Protein: negative   (Normal Range: Negative) Urobilinogen: 0.2   (Normal Range: 0-1) Nitrite: negative   (Normal Range: Negative) Leukocyte Esterace: negative   (Normal Range: Negative)

## 2010-12-04 NOTE — Progress Notes (Signed)
Summary: refill  Phone Note Refill Request Message from:  Fax from Pharmacy on September 07, 2010 9:45 AM  Refills Requested: Medication #1:  LIPITOR 10 MG TABS Take 1 by mouth once daily [facility] - [address] - fax [phone]  Initial call taken by: Okey Regal [place],  September 07, 2010 9:46 AM  Follow-up for Phone Call        done already this am...............Marland KitchenFelecia Deloach CMA  September 07, 2010 11:13 AM

## 2010-12-04 NOTE — Assessment & Plan Note (Signed)
Summary: 3 MONTH FOLLOWUP, MED REFILL///[facility]   Vital Signs:  Patient profile:   60 year old female Weight:      259.2 pounds Temp:     97.9 degrees F oral Pulse rate:   60 / minute Pulse rhythm:   regular BP sitting:   116 / 70  (right arm) Cuff size:   large  Vitals Entered By: Almeta Monas CMA Duncan Dull) (September 21, 2010 9:39 AM) CC: 3 mo f/u on meds---still having right sided abdominal pain   History of Present Illness: Pt here for f/u but still c/o RUQ pain---CT and [place] were negative   Hyperlipidemia follow-up      This is a 60 year old woman who presents for Hyperlipidemia follow-up.  The patient complains of abdominal pain, but denies muscle aches, GI upset, flushing, itching, constipation, diarrhea, and fatigue.  The patient denies the following symptoms: chest pain/pressure, exercise intolerance, dypsnea, palpitations, syncope, and pedal edema.  Compliance with medications (by patient report) has been near 100%.  Dietary compliance has been fair.  The patient reports exercising occasionally.  Adjunctive measures currently used by the patient include ASA.    Hypertension follow-up      The patient also presents for Hypertension follow-up.  The patient denies lightheadedness, urinary frequency, headaches, edema, impotence, rash, and fatigue.  The patient denies the following associated symptoms: chest pain, chest pressure, exercise intolerance, dyspnea, palpitations, syncope, leg edema, and pedal edema.  Compliance with medications (by patient report) has been near 100%.  The patient reports that dietary compliance has been fair.  The patient reports exercising occasionally.  Adjunctive measures currently used by the patient include salt restriction.    Type 1 diabetes mellitus follow-up      The patient is also here for Type 2 diabetes mellitus follow-up.  The patient denies polyuria, polydipsia, blurred vision, self managed hypoglycemia, hypoglycemia requiring help, weight loss,  weight gain, and numbness of extremities.  The patient denies the following symptoms: neuropathic pain, chest pain, vomiting, orthostatic symptoms, poor wound healing, intermittent claudication, vision loss, and foot ulcer.  Since the last visit the patient reports good dietary compliance, compliance with medications, not exercising regularly, and not monitoring blood glucose.  Since the last visit, the patient reports having had eye care by an ophthalmologist and foot care by a podiatrist.    Preventive Screening-Counseling & Management  Alcohol-Tobacco     Alcohol drinks/day: <1     Alcohol type: wine or mixed drink     >5/day in last 3 mos: no     Smoking Status: never  Caffeine-Diet-Exercise     Caffeine use/day: 2     Does Patient Exercise: yes     Type of exercise: walk     Times/week: <3     Exercise Counseling: to improve exercise regimen  Current Medications (verified): 1)  Folic Acid 1 Mg Tabs (Folic Acid) .Marland Kitchen.. 1 Once Daily 2)  Triamterene-Hctz 37.5-25 Mg Tabs (Triamterene-Hctz) .Marland Kitchen.. 1 Two Times A Day 3)  Ascorbic Acid 500 Mg Tabs (Ascorbic Acid) .Marland Kitchen.. 1 Once Daily 4)  Potassium 99 Mg Tabs (Potassium) .Marland Kitchen.. 1 Once Daily 5)  Multivitamins  Caps (Multiple Vitamin) .Marland Kitchen.. 1 Once Daily 6)  Coq10 50 Gm Powd (Coenzyme Q10) 7)  Aspirin 81 Mg Tabs (Aspirin) .Marland Kitchen.. 1 Once Daily 8)  Vitamin D 400 Unit Caps (Cholecalciferol) .... 3 Once Daily 9)  Fish Oil Triple Strength 1400 Mg Caps (Omega-3 Fatty Acids) .... Once Daily 10)  Lexapro 20 Mg  Tabs (Escitalopram Oxalate) .Marland Kitchen.. 1 By Mouth Once Daily 11)  Fosinopril Sodium 20 Mg Tabs (Fosinopril Sodium) .... Take Two Times A Day 12)  Nasonex 50 Mcg/act Susp (Mometasone Furoate) .... 2 Sprays Each Nostril Once Daily 13)  Kombiglyze Xr 03-999 Mg Xr24h-Tab (Saxagliptin-Metformin) .Marland Kitchen.. 1 By Mouth Once Daily. 14)  Onetouch Delica Lancets  Misc (Lancets) .... Acc U Check Two Times A Day 15)  One Touch Ultra Mini Strips .... Accu Check Bid 16)  Antara  130 Mg Caps (Fenofibrate Micronized) .Marland Kitchen.. 1 By Mouth Daily. 17)  Lipitor 10 Mg Tabs (Atorvastatin Calcium) .... Take 1 By Mouth Once Daily 18)  Prilosec Otc 20 Mg Tbec (Omeprazole Magnesium) .Marland Kitchen.. 1 By Mouth Once Daily  Allergies (verified): 1)  ! Norvasc  Past History:  Past Medical History: Last updated: 05/14/2010 Headache Hypertension Deaf Right Ear Hyperlipidemia  Past Surgical History: Last updated: 04/07/2007 Hysterectomy (1985) Tonsillectomy Cervical fusion (07/2004)- [place]  Family History: Last updated: 05/16/2009 Family History Lung cancer--  F Family History MI Family History of Thromboembolism clotting disorder--- factor V ---sister and niece Family History of Alcoholism/Addiction Family History of CAD Female 1st degree relative--Mother Family History of Stroke F relative <60 Family History of Stroke M relative <50 Muncle--leukemia  Social History: Last updated: 05/04/2008 Occupation: Animal nutritionist- IT dept Married Never Smoked Alcohol use-no Drug use-no Regular exercise-no  Risk Factors: Alcohol Use: <1 (09/21/2010) >5 drinks/d w/in last 3 months: no (09/21/2010) Caffeine Use: 2 (09/21/2010) Exercise: yes (09/21/2010)  Risk Factors: Smoking Status: never (09/21/2010)  Family History: Reviewed history from 05/16/2009 and no changes required. Family History Lung cancer--  F Family History MI Family History of Thromboembolism clotting disorder--- factor V ---sister and niece Family History of Alcoholism/Addiction Family History of CAD Female 1st degree relative--Mother Family History of Stroke F relative <60 Family History of Stroke M relative <50 Muncle--leukemia  Social History: Reviewed history from 05/04/2008 and no changes required. Occupation: Animal nutritionist- IT dept Married Never Smoked Alcohol use-no Drug use-no Regular exercise-no  Review of Systems      See HPI  Physical Exam  General:  Well-developed,well-nourished,in no  acute distress; alert,appropriate and cooperative throughout examinationoverweight-appearing.   Lungs:  Normal respiratory effort, chest expands symmetrically. Lungs are clear to auscultation, no crackles or wheezes. Heart:  normal rate and no murmur.   Abdomen:  mild RUQ tenderness no rebound/ guarding Extremities:  No clubbing, cyanosis, edema, or deformity noted with normal full range of motion of all joints.   Skin:  Intact without suspicious lesions or rashes Psych:  Oriented X3 and normally interactive.    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   Problem # 2:  DIABETES MELLITUS, TYPE II (ICD-250.00)  Her updated medication list for this problem includes:    Aspirin 81 Mg Tabs (Aspirin) .Marland Kitchen... 1 once daily    Fosinopril Sodium 20 Mg Tabs (Fosinopril sodium) .Marland Kitchen... Take two  times a day    Kombiglyze Xr 03-999 Mg Xr24h-tab (Saxagliptin-metformin) .Marland Kitchen... 1 by mouth once daily.  Orders: Venipuncture (09811) TLB-Lipid Panel (80061-LIPID) TLB-BMP (Basic Metabolic Panel-BMET) (80048-METABOL) TLB-CBC Platelet - w/Differential (85025-CBCD) TLB-Hepatic/Liver Function Pnl (80076-HEPATIC) TLB-TSH (Thyroid Stimulating Hormone) (84443-TSH) TLB-Amylase (82150-AMYL) TLB-Lipase (83690-LIPASE) TLB-Microalbumin/Creat Ratio, Urine (82043-MALB) TLB-A1C / Hgb A1C (Glycohemoglobin) (83036-A1C) Specimen Handling (91478) Specimen Handling (29562)  Labs Reviewed: Creat: 0.8 (09/05/2010)     Last Eye Exam: normal (11/15/2009) Reviewed HgBA1c results: 6.5 (10/08/2007)  Problem # 3:  HYPERLIPIDEMIA (ICD-272.4)  Her updated medication list for this problem includes:    Antara 130 Mg Caps (Fenofibrate micronized) .Marland Kitchen... 1 by mouth daily.    Lipitor 10 Mg Tabs (Atorvastatin calcium) .Marland Kitchen... Take 1 by mouth once daily  Orders: Venipuncture (13086) TLB-Lipid Panel (80061-LIPID) TLB-BMP (Basic Metabolic Panel-BMET) (80048-METABOL) TLB-CBC Platelet - w/Differential (85025-CBCD) TLB-Hepatic/Liver Function Pnl (80076-HEPATIC) TLB-TSH (Thyroid Stimulating Hormone) (84443-TSH) TLB-Amylase (82150-AMYL) TLB-Lipase (83690-LIPASE) Specimen Handling (57846) Specimen Handling (96295)  Labs Reviewed: SGOT: 28 (09/05/2010)   SGPT: 25 (09/05/2010)   HDL:38.7 (05/04/2008), 40.8 (10/08/2007)  LDL:85 (05/04/2008), 111 (28/41/3244)  Chol:154 (05/04/2008), 180 (10/08/2007)  Trig:151 (05/04/2008), 141 (10/08/2007)  Problem # 4:  HYPERTENSION (ICD-401.9)  Her updated medication list for this problem includes:    Triamterene-hctz 37.5-25 Mg Tabs (Triamterene-hctz) .Marland Kitchen... 1 two times a day    Fosinopril Sodium 20 Mg Tabs (Fosinopril sodium) .Marland Kitchen... Take two times a day  Orders: Venipuncture (01027) TLB-Lipid Panel (80061-LIPID) TLB-BMP (Basic Metabolic Panel-BMET) (80048-METABOL) TLB-CBC  Platelet - w/Differential (85025-CBCD) TLB-Hepatic/Liver Function Pnl (80076-HEPATIC) TLB-TSH (Thyroid Stimulating Hormone) (84443-TSH) TLB-Amylase (82150-AMYL) TLB-Lipase (83690-LIPASE) Specimen Handling (25366)  BP today: 116/70 Prior BP: 114/72 (09/05/2010)  Labs Reviewed: K+: 4.6 (09/05/2010) Creat: : 0.8 (09/05/2010)   Chol: 154 (05/04/2008)   HDL: 38.7 (05/04/2008)   LDL: 85 (05/04/2008)   TG: 151 (05/04/2008)  Complete Medication List: 1)  Folic Acid 1 Mg Tabs (Folic acid) .Marland Kitchen.. 1 once daily 2)  Triamterene-hctz 37.5-25 Mg Tabs (Triamterene-hctz) .Marland Kitchen.. 1 two times a day 3)  Ascorbic Acid 500 Mg Tabs (Ascorbic acid) .Marland Kitchen.. 1 once daily 4)  Potassium 99 Mg  Tabs (Potassium) .Marland Kitchen.. 1 once daily 5)  Multivitamins Caps (Multiple vitamin) .Marland Kitchen.. 1 once daily 6)  Coq10 50 Gm Powd (Coenzyme q10) 7)  Aspirin 81 Mg Tabs (Aspirin) .Marland Kitchen.. 1 once daily 8)  Vitamin D 400 Unit Caps (Cholecalciferol) .... 3 once daily 9)  Fish Oil Triple Strength 1400 Mg Caps (Omega-3 fatty acids) .... Once daily 10)  Lexapro 20 Mg Tabs (Escitalopram oxalate) .Marland Kitchen.. 1 by mouth once daily 11)  Fosinopril Sodium 20 Mg Tabs (Fosinopril sodium) .... Take two times a day 12)  Nasonex 50 Mcg/act Susp (Mometasone furoate) .... 2 sprays each nostril once daily 13)  Kombiglyze Xr 03-999 Mg Xr24h-tab (Saxagliptin-metformin) .Marland Kitchen.. 1 by mouth once daily. 14)  Onetouch Delica Lancets Misc (Lancets) .... Acc u check two times a day 15)  One Touch Ultra Mini Strips  .... Accu check bid 16)  Antara 130 Mg Caps (Fenofibrate micronized) .Marland Kitchen.. 1 by mouth daily. 17)  Lipitor 10 Mg Tabs (Atorvastatin calcium) .... Take 1 by mouth once daily 18)  Prilosec Otc 20 Mg Tbec (Omeprazole magnesium) .Marland Kitchen.. 1 by mouth once daily   Orders Added: 1)  Venipuncture [36415] 2)  TLB-Lipid Panel [80061-LIPID] 3)  TLB-BMP (Basic Metabolic Panel-BMET) [80048-METABOL] 4)  TLB-CBC Platelet - w/Differential [85025-CBCD] 5)  TLB-Hepatic/Liver Function Pnl  [80076-HEPATIC] 6)  TLB-TSH (Thyroid Stimulating Hormone) [84443-TSH] 7)  TLB-Amylase [82150-AMYL] 8)  TLB-Lipase [83690-LIPASE] 9)  TLB-Microalbumin/Creat Ratio, Urine [82043-MALB] 10)  TLB-A1C / Hgb A1C (Glycohemoglobin) [83036-A1C] 11)  Specimen Handling [99000] 12)  Radiology Referral [Radiology] 13)  Specimen Handling [99000] 14)  Est. Patient Level IV [81191]

## 2010-12-04 NOTE — Progress Notes (Signed)
Summary: Work in sooner than 11-28-10  Phone Note From [employer] back at [facility] Phone [phone]   Caller: Luster Landsberg @ Dr Laury Axon [phone] Call For: Dr Juanda Chance Summary of Call: RUQ Abd Pain x2wks. Would like pt seen sooner than first available on 11-28-10. Ok to call pt back if we can work in sooner with Dr Juanda Chance or PA. Initial call taken by: Leanor Kail [facility],  September 21, 2010 3:39 PM  Follow-up for Phone Call        Patient scheduled with Mike Gip, PA on 09/25/10 at 2:30 PM. Renee with Dr. Laury Axon given appointment time. Follow-up by: Jesse Fall RN,  September 21, 2010 3:51 PM

## 2010-12-04 NOTE — Assessment & Plan Note (Signed)
Summary: RUQ pain/Jacqueline Duran   History of Present Illness Visit Type: Initial Consult Primary GI MD: Lina Sar MD Primary Provider: Loreen Freud DO Requesting Provider: Loreen Freud DO Chief Complaint: RUQ pain History of Present Illness:   PLEASANT 59 YO FEMALE   KNOWN TO DR. Juanda Chance WITH HX OF COLON POLYPS. SHE UNDERWENT COLONOSCOPY  IN 8/11/HYPERPLASTIC POLYPS. SHE IS REFERRED BACK IN TODAY FRO RUQ PAIN. SHE HAS NOT HAD ANY OTHER GI PROBLEMS IN THE PAST. SHE HAD ACUTE ONSET OF RUQ PAIN AFTER EATING FRIED CHICKEN  4 WEEKS AGO. SHE DEVELOPED PAIN AFTERWARD WHICH HAS BEEN  PERSISTENT SINCE. SHE HAS SOME DEGREE OF PAIN ALL DAY LONG, VARYING  IN INTENSITY, NEVER COMPLETELY GOES AWAY. SHE HAS HAD SOME NAUSEA, NO VOMITING. NO FEVER, CHILLS, NO CHANGE IN HER BOWEL HABITS.  SHE HAS BEEN EATING BLAND-DOES NOT FEEL THAT HER SXS ARE   EXACERBATED BY EATING OR AN EMPTY STOMACH. NO HEARBURN, INDIGESTION ETC. SHE FEELS THE PAIN IN HER RUQ/EPIGASTRIUM AND IN TO HER BACK. DESCRIBES AS ACHING,PRESSURE, SOMETIMES SHARP. NO REGUALR NSAID USE, TAKES A BABY ASA DAILY.  RECENT LABS HAVE BEEN UNREMARKABLE  EXCEPT WBC 12.5(09/05/10).  CT ABDOMEN /PELVIS  PERTINENT FOR FATTY LIVER, NO DUCTAL DILATION. SHE HAS A CONGENITAL MALROTATION OF THE RIGHT KIDNEY. ABDOMINAL US  WAS NEGATIVE FOR GALLSTONES. CCK HIDA SCAN ON 11/18   WITH LOW EF OF 23%. SHE SAYS SHE IS TIRED OF HURTING AND WANTS THE PAIN TO GO AWAY.   GI Review of Systems    Reports abdominal pain, bloating, and  nausea.     Location of  Abdominal pain: RUQ.    Denies acid reflux, belching, chest pain, dysphagia with liquids, dysphagia with solids, heartburn, loss of appetite, vomiting, vomiting blood, weight loss, and  weight gain.      Reports hemorrhoids.     Denies anal fissure, black tarry stools, change in bowel habit, constipation, diarrhea, diverticulosis, fecal incontinence, heme positive stool, irritable bowel syndrome, jaundice, light color stool,  liver problems, rectal bleeding, and  rectal pain.   Current Medications (verified): 1)  Folic Acid 1 Mg Tabs (Folic Acid) .Marland Kitchen.. 1 Once Daily 2)  Triamterene-Hctz 37.5-25 Mg Tabs (Triamterene-Hctz) .Marland Kitchen.. 1 Two Times A Day 3)  Ascorbic Acid 500 Mg Tabs (Ascorbic Acid) .Marland Kitchen.. 1 Once Daily 4)  Potassium 99 Mg Tabs (Potassium) .Marland Kitchen.. 1 Once Daily 5)  Multivitamins  Caps (Multiple Vitamin) .Marland Kitchen.. 1 Once Daily 6)  Coq10 50 Gm Powd (Coenzyme Q10) 7)  Aspirin 81 Mg Tabs (Aspirin) .Marland Kitchen.. 1 Once Daily 8)  Vitamin D 400 Unit Caps (Cholecalciferol) .... 3 Once Daily 9)  Fish Oil Triple Strength 1400 Mg Caps (Omega-3 Fatty Acids) .... Once Daily 10)  Lexapro 20 Mg Tabs (Escitalopram Oxalate) .Marland Kitchen.. 1 By Mouth Once Daily 11)  Fosinopril Sodium 20 Mg Tabs (Fosinopril Sodium) .... Take Two Times A Day 12)  Nasonex 50 Mcg/act Susp (Mometasone Furoate) .... 2 Sprays Each Nostril Once Daily 13)  Kombiglyze Xr 03-999 Mg Xr24h-Tab (Saxagliptin-Metformin) .Marland Kitchen.. 1 By Mouth Once Daily. 14)  Onetouch Delica Lancets  Misc (Lancets) .... Acc U Check Two Times A Day 15)  One Touch Ultra Mini Strips .... Accu Check Bid 16)  Antara 130 Mg Caps (Fenofibrate Micronized) .Marland Kitchen.. 1 By Mouth Daily. 17)  Lipitor 10 Mg Tabs (Atorvastatin Calcium) .... Take 1 By Mouth Once Daily 18)  Prilosec Otc 20 Mg Tbec (Omeprazole Magnesium) .Marland Kitchen.. 1 By Mouth Once Daily  Allergies (verified): 1)  ! Norvasc  Past History:  Past Medical History: Headache Hypertension Deaf Right Ear Hyperlipidemia AODM OBESITY COLON POLYPS  Past Surgical History: Hysterectomy (1985) Tonsillectomy Cervical fusion (07/2004)- cabbell COLONOSCOPY 8/11 BRODIE-HYPERPLASTIC POLYPS  Family History: Reviewed history from 05/16/2009 and no changes required. Family History Lung cancer--  F Family History MI Family History of Thromboembolism clotting disorder--- factor V ---sister and niece Family History of Alcoholism/Addiction Family History of CAD Female 1st degree  relative--Mother Family History of Stroke F relative <60 Family History of Stroke M relative <50 Muncle--leukemia  Social History: Reviewed history from 05/04/2008 and no changes required. Occupation: Animal nutritionist- IT dept Married Never Smoked Alcohol use-no Drug use-no Regular exercise-no  Review of Systems  The patient denies allergy/sinus, anemia, anxiety-new, arthritis/joint pain, back pain, blood in urine, breast changes/lumps, change in vision, confusion, cough, coughing up blood, depression-new, fainting, fatigue, fever, headaches-new, hearing problems, heart murmur, heart rhythm changes, itching, menstrual pain, muscle pains/cramps, night sweats, nosebleeds, pregnancy symptoms, shortness of breath, skin rash, sleeping problems, sore throat, swelling of feet/legs, swollen lymph glands, thirst - excessive , urination - excessive , urination changes/pain, urine leakage, vision changes, and voice change.         SEE HPI  Vital Signs:  Patient profile:   60 year old female Height:      64.5 inches Weight:      257 pounds BMI:     43.59 Pulse rate:   72 / minute Pulse rhythm:   regular BP sitting:   118 / 56  (left arm)  Vitals Entered By: Chales Abrahams CMA Duncan Dull) (September 25, 2010 2:21 PM)  Physical Exam  General:  Well developed, well nourished, no acute distress. Head:  Normocephalic and atraumatic. Eyes:  PERRLA, no icterus. Lungs:  Clear throughout to auscultation. Heart:  Regular rate and rhythm; no murmurs, rubs,  or bruits. Abdomen:  LARGE,SOFT, TENDER RUQ,EPIGASTRIUM, NO GUARDING, NO REBOUND, NO MASS OR HSM,BS+ Rectal:  NOT DONE Extremities:  No clubbing, cyanosis, edema or deformities noted. Neurologic:  Alert and  oriented x4;  grossly normal neurologically. Psych:  Alert and cooperative. Normal mood and affect.  Impression & Recommendations:  Problem # 1:  RUQ PAIN (ICD-789.01) Assessment Deteriorated 59 YO FEMALE, DIABETIC WITH ONE MONTH HX OF FAIRLY  CONSTANT RUQ PAIN, ABNORMAL CCK HIDA SCAN . 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 LOW FAT, BLAND DIET TRIAL OF PRILOSEC 20 MG DAILY SURGICAL REFERRAL FOR CHOLECYSTECTOMY OFFERED AN ANALGESIC FOR as needed USE-DECLINED.  Problem # 2:  PERSONAL HX COLONIC POLYPS (ICD-V12.72) Assessment: Comment Only DUE FOR FOLLOW UP  10 YEARS  Problem # 3:  OBESITY (ICD-278.00) Assessment: Comment Only  Problem # 4:  HYPERLIPIDEMIA (ICD-272.4) Assessment: Comment Only  Problem # 5:  DIABETES MELLITUS, TYPE II (ICD-250.00) Assessment: Comment Only  Problem # 6:  HYPERTENSION (ICD-401.9) Assessment: Comment Only  Patient Instructions: 1)  Take Prilosec 20 mg 30 min prior to breakfast. 2)  Stay on a Low Fat Diet. 3)  We have called [facility] and left a message for the new patient coordinator to call me back regarding an appointment for a surgical consult.   4)  I will call you with the appointment date. 5)  Copy sent to : Loreen Freud, DO 6)  The medication list was reviewed and reconciled.  All changed / newly prescribed medications were explained.  A complete medication list was provided to the patient / caregiver.

## 2010-12-04 NOTE — Progress Notes (Signed)
Summary: Results  Phone Note Outgoing Call Call back at [phone]   Call placed by: Almeta Monas CMA Duncan Dull),  September 06, 2010 12:02 PM Call placed to: Patient Details for Reason: Results Summary of Call: Spk with pt and advised there were no Gallstones seen on her U/S adv if pain is no better we will have a CT done per Dr.Lowne, Pt voiced understanding. Stated she was still in alot of pain and would like to have it done if necessary. Advise pt I will let Dr.Lowne know. Pt voiced understanding. Best c/b # is cell [phone].Marland KitchenMarland KitchenMarland KitchenPlease advise..... Almeta Monas CMA Duncan Dull)  September 06, 2010 12:04 PM   Follow-up for Phone Call        ordered Follow-up by: Loreen Freud DO,  September 06, 2010 12:10 PM  Additional Follow-up for Phone Call Additional follow up Details #1::        Pt aware..... Additional Follow-up by: Almeta Monas CMA Duncan Dull),  September 06, 2010 2:13 PM

## 2010-12-04 NOTE — Letter (Signed)
Summary: Diabetic Instructions  [facility]  [address] [place], [place] [address]   Phone: [phone]  Fax: [phone]    Jacqueline Duran [birth year] MRN: [id number]   _  _   ORAL DIABETIC MEDICATION INSTRUCTIONS  The day before your procedure:   Take your diabetic pill as you do normally  The day of your procedure:   Do not take your diabetic pill    We will check your blood sugar levels during the admission process and again in Recovery before discharging you home  ________________________________________________________________________

## 2010-12-04 NOTE — Progress Notes (Signed)
Summary: HIDA scan results 11/18  Phone Note Call from Patient   Summary of Call: normal filling of gallbladder w/ ejaction fraction was below normal at 23% they will fax over report  Initial call taken by: Doristine Devoid CMA,  September 21, 2010 1:32 PM  Follow-up for Phone Call        normal filling---low EF refer to GI--- let them know pt is in pain--not acute Follow-up by: Loreen Freud DO,  September 21, 2010 2:14 PM  Additional Follow-up for Phone Call Additional follow up Details #1::        Left message to call back... Almeta Monas CMA Duncan Dull)  September 21, 2010 3:57 PM     Additional Follow-up for Phone Call Additional follow up Details #2::    PATIENT HAS APPT W/[facility] ON 09-25-2009 & PATIENT IS AWARE. Magdalen Spatz [facility]  September 21, 2010 4:02 PM

## 2010-12-04 NOTE — Assessment & Plan Note (Signed)
Summary: fu on meds/kdc   Vital Signs:  Patient profile:   60 year old female Height:      64.5 inches Weight:      274 pounds BMI:     46.47 Pulse rate:   72 / minute Pulse rhythm:   regular BP sitting:   138 / 80  (left arm) Cuff size:   large  Vitals Entered By: Army Fossa CMA (February 06, 2010 10:08 AM) CC: Pt here to follow up on Meds. CBG Result 219   History of Present Illness: Pt is here for bp f/u. She never came in for repeat blood sugar------ pt is diabetic.    Current Medications (verified): 1)  Folic Acid 1 Mg Tabs (Folic Acid) .Marland Kitchen.. 1 Once Daily 2)  Triamterene-Hctz 37.5-25 Mg Tabs (Triamterene-Hctz) .Marland Kitchen.. 1 Two Times A Day 3)  Ascorbic Acid 500 Mg Tabs (Ascorbic Acid) .Marland Kitchen.. 1 Once Daily 4)  Potassium 99 Mg Tabs (Potassium) .Marland Kitchen.. 1 Once Daily 5)  Vitamin E 400 Unit Caps (Vitamin E) .Marland Kitchen.. 1 Once Daily 6)  Multivitamins  Caps (Multiple Vitamin) .Marland Kitchen.. 1 Once Daily 7)  Coq10 50 Gm Powd (Coenzyme Q10) 8)  Aspirin 81 Mg Tabs (Aspirin) .Marland Kitchen.. 1 Once Daily 9)  Vitamin D 400 Unit Caps (Cholecalciferol) .... 3 Once Daily 10)  Fish Oil 1000 Mg Caps (Omega-3 Fatty Acids) .Marland Kitchen.. 1 Once Daily 11)  Lexapro 20 Mg Tabs (Escitalopram Oxalate) .Marland Kitchen.. 1 By Mouth Once Daily 12)  Fosinopril Sodium 20 Mg Tabs (Fosinopril Sodium) .... Take Two Times A Day 13)  Nasonex 50 Mcg/act Susp (Mometasone Furoate) .... 2 Sprays Each Nostril Once Daily 14)  Neurontin 300 Mg Caps (Gabapentin) .Marland Kitchen.. 1 By Mouth At Bedtime 15)  Glucophage Xr 500 Mg Xr24h-Tab (Metformin Hcl) .Marland Kitchen.. 1po Qpm 16)  Onetouch Delica Lancets  Misc (Lancets) .... Acc U Check Two Times A Day 17)  One Touch Ultra Mini Strips .... Accu Check Bid  Allergies: 1)  ! Norvasc  Past History:  Past medical, surgical, family and social histories (including risk factors) reviewed for relevance to current acute and chronic problems.  Past Medical History: Reviewed history from 04/03/2007 and no changes required. Headache Hypertension Deaf  Right Ear  Past Surgical History: Reviewed history from 04/07/2007 and no changes required. Hysterectomy (1985) Tonsillectomy Cervical fusion (07/2004)- cabbell  Family History: Reviewed history from 05/16/2009 and no changes required. Family History Lung cancer--  F Family History MI Family History of Thromboembolism clotting disorder--- factor V ---sister and niece Family History of Alcoholism/Addiction Family History of CAD Female 1st degree relative--Mother Family History of Stroke F relative <60 Family History of Stroke M relative <50 Muncle--leukemia  Social History: Reviewed history from 05/04/2008 and no changes required. Occupation: Animal nutritionist- IT dept Married Never Smoked Alcohol use-no Drug use-no Regular exercise-no  Review of Systems      See HPI  Physical Exam  General:  Well-developed,well-nourished,in no acute distress; alert,appropriate and cooperative throughout examination Neck:  No deformities, masses, or tenderness noted. Lungs:  Normal respiratory effort, chest expands symmetrically. Lungs are clear to auscultation, no crackles or wheezes. Heart:  Normal rate and regular rhythm. S1 and S2 normal without gallop, murmur, click, rub or other extra sounds.  Diabetes Management Exam:    Foot Exam (with socks and/or shoes not present):       Sensory-Pinprick/Light touch:          Left medial foot (L-4): diminished  Left dorsal foot (L-5): diminished          Left lateral foot (S-1): diminished          Right medial foot (L-4): diminished          Right dorsal foot (L-5): diminished          Right lateral foot (S-1): diminished       Sensory-Monofilament:          Left foot: absent          Right foot: absent       Inspection:          Left foot: normal          Right foot: normal       Nails:          Left foot: normal          Right foot: normal    Eye Exam:       Eye Exam done elsewhere   Impression & Recommendations:  Problem #  1:  DIABETES MELLITUS, TYPE II (ICD-250.00) check BS two times a day--drop off 2 weeks Her updated medication list for this problem includes:    Aspirin 81 Mg Tabs (Aspirin) .Marland Kitchen... 1 once daily    Fosinopril Sodium 20 Mg Tabs (Fosinopril sodium) .Marland Kitchen... Take two times a day    Glucophage Xr 500 Mg Xr24h-tab (Metformin hcl) .Marland Kitchen... 1po qpm  Orders: Venipuncture (84696) TLB-Lipid Panel (80061-LIPID) TLB-BMP (Basic Metabolic Panel-BMET) (80048-METABOL) TLB-Hepatic/Liver Function Pnl (80076-HEPATIC) TLB-A1C / Hgb A1C (Glycohemoglobin) (83036-A1C) TLB-Microalbumin/Creat Ratio, Urine (82043-MALB) Nutrition Referral (Nutrition)  Labs Reviewed: Creat: 0.7 (05/04/2008)    Reviewed HgBA1c results: 6.5 (10/08/2007)  Problem # 2:  HYPERTENSION (ICD-401.9)  Her updated medication list for this problem includes:    Triamterene-hctz 37.5-25 Mg Tabs (Triamterene-hctz) .Marland Kitchen... 1 two times a day    Fosinopril Sodium 20 Mg Tabs (Fosinopril sodium) .Marland Kitchen... Take two times a day  Orders: Venipuncture (29528) TLB-Lipid Panel (80061-LIPID) TLB-BMP (Basic Metabolic Panel-BMET) (80048-METABOL) TLB-Hepatic/Liver Function Pnl (80076-HEPATIC) TLB-A1C / Hgb A1C (Glycohemoglobin) (83036-A1C) TLB-Microalbumin/Creat Ratio, Urine (82043-MALB)  BP today: 138/80 Prior BP: 150/79 (05/16/2009)  Labs Reviewed: K+: 4.2 (05/04/2008) Creat: : 0.7 (05/04/2008)   Chol: 154 (05/04/2008)   HDL: 38.7 (05/04/2008)   LDL: 85 (05/04/2008)   TG: 151 (05/04/2008)  Complete Medication List: 1)  Folic Acid 1 Mg Tabs (Folic acid) .Marland Kitchen.. 1 once daily 2)  Triamterene-hctz 37.5-25 Mg Tabs (Triamterene-hctz) .Marland Kitchen.. 1 two times a day 3)  Ascorbic Acid 500 Mg Tabs (Ascorbic acid) .Marland Kitchen.. 1 once daily 4)  Potassium 99 Mg Tabs (Potassium) .Marland Kitchen.. 1 once daily 5)  Vitamin E 400 Unit Caps (Vitamin e) .Marland Kitchen.. 1 once daily 6)  Multivitamins Caps (Multiple vitamin) .Marland Kitchen.. 1 once daily 7)  Coq10 50 Gm Powd (Coenzyme q10) 8)  Aspirin 81 Mg Tabs (Aspirin) .Marland Kitchen..  1 once daily 9)  Vitamin D 400 Unit Caps (Cholecalciferol) .... 3 once daily 10)  Fish Oil 1000 Mg Caps (Omega-3 fatty acids) .Marland Kitchen.. 1 once daily 11)  Lexapro 20 Mg Tabs (Escitalopram oxalate) .Marland Kitchen.. 1 by mouth once daily 12)  Fosinopril Sodium 20 Mg Tabs (Fosinopril sodium) .... Take two times a day 13)  Nasonex 50 Mcg/act Susp (Mometasone furoate) .... 2 sprays each nostril once daily 14)  Neurontin 300 Mg Caps (Gabapentin) .Marland Kitchen.. 1 by mouth at bedtime 15)  Glucophage Xr 500 Mg Xr24h-tab (Metformin hcl) .Marland Kitchen.. 1po qpm 16)  Onetouch Delica Lancets Misc (Lancets) .... Acc u check two  times a day 17)  One Touch Ultra Mini Strips  .... Accu check bid  Patient Instructions: 1)  check blood sugars two times a day for 2 weeks and call them in or drop them off  Prescriptions: ONE TOUCH ULTRA MINI STRIPS accu check bid  #60 x 11   Entered and Authorized by:   Loreen Freud DO   Signed by:   Loreen Freud DO on 02/06/2010   Method used:   Faxed to ...       [facility] (retail)       [address]       [place], [place]  [address]       Ph: [phone]       Fax: [phone]   RxID:   [id number] Dola Argyle LANCETS  MISC (LANCETS) acc u check two times a day  #60 x 11   Entered and Authorized by:   Loreen Freud DO   Signed by:   Loreen Freud DO on 02/06/2010   Method used:   Electronically to        [facility] (retail)       [address]       [place], [place]  [address]       Ph: [phone]       Fax: [phone]   RxID:   [id number] GLUCOPHAGE XR 500 MG XR24H-TAB (METFORMIN HCL) 1po qpm  #30 x 2   Entered and Authorized by:   Loreen Freud DO   Signed by:   Loreen Freud DO on 02/06/2010   Method used:   Electronically to        [facility] (retail)       [address]       [place], [place]  [address]       Ph: [phone]       Fax: [phone]   RxID:    [id number] NEURONTIN 300 MG CAPS (GABAPENTIN) 1 by mouth at bedtime  #30 x 2   Entered and Authorized by:   Loreen Freud DO   Signed by:   Loreen Freud DO on 02/06/2010   Method used:   Electronically to        [facility] (retail)       [address]       [place], [place]  [address]       Ph: [phone]       Fax: [phone]   RxID:   [id number]   Laboratory Results   Blood Tests     CBG Random:: 219mg /dL

## 2010-12-04 NOTE — Progress Notes (Signed)
----   Converted from flag ---- ---- 09/05/2010 3:45 PM, Almeta Monas CMA (AAMA) wrote: Call from Radiology in reference to the Abdominal U/S Ecogenic Liver consistent with hepatic steatosis. No gallstones, No focal abnormality, somewhat ecogenic pancreas. ------------------------------

## 2010-12-04 NOTE — Progress Notes (Signed)
Summary: Lab Results   Phone Note Outgoing Call   Call placed by: Army Fossa CMA,  February 14, 2010 1:31 PM Reason for Call: Discuss lab or test results Summary of Call: Regarding lab results, LMTCB:  DM not controlled.  Change glucophage to kombiglyza 03/999 1 by mouth once daily #30  2 refills  Ideally your LDL (bad cholesterol) should be <__70__, your HDL (good cholesterol) should be >_40__ and your triglycerides should be< 150.  Diet and exercise will increase HDL and decrease the LDL and triglycerides. Read Dr. Danice Goltz book--Eat Drink and Be Healthy.  We will recheck labs in _3__ months.    start antara 130mg   #30  1 by mouth once daily, 2 refils---give coupon 272.4  250.00  hep, lipid, hgba1c, bmp, microalbumin Signed by Loreen Freud DO on 02/13/2010 at 9:43 PM   Follow-up for Phone Call        LMTCB. Army Fossa CMA  February 15, 2010 10:30 AM   Additional Follow-up for Phone Call Additional follow up Details #1::        LMTCB. Army Fossa CMA  February 16, 2010 10:20 AM     Additional Follow-up for Phone Call Additional follow up Details #2::    Pt is aware, meds sent in. Coupons up front. Army Fossa CMA  February 16, 2010 3:42 PM   New/Updated Medications: KOMBIGLYZE XR 03-999 MG XR24H-TAB (SAXAGLIPTIN-METFORMIN) 1 by mouth once daily. ANTARA 130 MG CAPS (FENOFIBRATE MICRONIZED) 1 by mouth daily. Prescriptions: ANTARA 130 MG CAPS (FENOFIBRATE MICRONIZED) 1 by mouth daily.  #30 x 2   Entered by:   Army Fossa CMA   Authorized by:   Loreen Freud DO   Signed by:   Army Fossa CMA on 02/16/2010   Method used:   Electronically to        [facility] (retail)       [address]       [place], [place]  [address]       Ph: [phone]       Fax: [phone]   RxID:   [id number] KOMBIGLYZE XR 03-999 MG XR24H-TAB (SAXAGLIPTIN-METFORMIN) 1 by mouth once daily.  #30 x 2   Entered by:   Army Fossa CMA  Authorized by:   Loreen Freud DO   Signed by:   Army Fossa CMA on 02/16/2010   Method used:   Electronically to        [facility] (retail)       [address]       [place], [place]  [address]       Ph: [phone]       Fax: [phone]   RxID:   [id number]

## 2010-12-04 NOTE — Miscellaneous (Signed)
Summary: [facility]  Clinical Lists Changes  Medications: Added new medication of MIRALAX   POWD (POLYETHYLENE GLYCOL 3350) As per prep  instructions. - Signed Added new medication of DULCOLAX 5 MG  TBEC (BISACODYL) Day before procedure take 2 at 3pm and 2 at 8pm. - Signed Added new medication of REGLAN 10 MG  TABS (METOCLOPRAMIDE HCL) As per prep instructions. - Signed Rx of MIRALAX   POWD (POLYETHYLENE GLYCOL 3350) As per prep  instructions.;  #255gm x 0;  Signed;  Entered by: Ezra Sites RN;  Authorized by: Hart Carwin MD;  Method used: Electronically to [facility] [address], [place], [place], [place]  [address], Ph: [phone], Fax: [phone] Rx of DULCOLAX 5 MG  TBEC (BISACODYL) Day before procedure take 2 at 3pm and 2 at 8pm.;  #4 x 0;  Signed;  Entered by: Ezra Sites RN;  Authorized by: Hart Carwin MD;  Method used: Electronically to [facility] [address], [place], [place], [place]  [address], Ph: [phone], Fax: [phone] Rx of REGLAN 10 MG  TABS (METOCLOPRAMIDE HCL) As per prep instructions.;  #2 x 0;  Signed;  Entered by: Ezra Sites RN;  Authorized by: Hart Carwin MD;  Method used: Electronically to [facility] [address] [place], [place], [place]  [address], Ph: [phone], Fax: [phone] Observations: Added new observation of ALLERGY REV: Done (05/28/2010 8:20)    Prescriptions: REGLAN 10 MG  TABS (METOCLOPRAMIDE HCL) As per prep instructions.  #2 x 0   Entered by:   Ezra Sites RN   Authorized by:   Hart Carwin MD   Signed by:   Ezra Sites RN on 05/28/2010   Method used:   Electronically to        [facility] (retail)       [address]       [place], [place]  [address]       Ph: [phone]       Fax: [phone]   RxID:   [id number] DULCOLAX 5 MG  TBEC (BISACODYL) Day before procedure take 2 at 3pm and 2 at 8pm.  #4 x 0  Entered by:   Ezra Sites RN   Authorized by:   Hart Carwin MD   Signed by:   Ezra Sites RN on 05/28/2010   Method used:   Electronically to        [facility] (retail)       [address]       [place], [place]  [address]       Ph: [phone]       Fax: [phone]   RxID:   [id number] MIRALAX   POWD (POLYETHYLENE GLYCOL 3350) As per prep  instructions.  #255gm x 0   Entered by:   Ezra Sites RN   Authorized by:   Hart Carwin MD   Signed by:   Ezra Sites RN on 05/28/2010   Method used:   Electronically to        [facility] (retail)       [address]       [place], [place]  [address]       Ph: [phone]       Fax: [phone]   RxID:   [id number]

## 2010-12-04 NOTE — Progress Notes (Signed)
Summary: -lab results  Phone Note Outgoing Call   Call placed by: [facility] CMA,  May 17, 2010 10:58 AM Summary of Call: per dr Laury Axon  -cont antara take lipitor 10mg  1 by mouth once daily #30 2 refills-give $4copay card. recheck all labs in 3 months.  -ideally <6.5 cont meds and diet and exercise recheck 3 month................Marland KitchenFelecia Deloach CMA  May 17, 2010 11:01 AM   left message to call  office Initial call taken by: [facility] CMA,  May 17, 2010 11:01 AM  Follow-up for Phone Call        DISCUSS WITH PATIENT, rx sent to pharmacy, copy of labs placed up front.Felecia Deloach CMA  May 17, 2010 11:08 AM     New/Updated Medications: LIPITOR 10 MG TABS (ATORVASTATIN CALCIUM) Take 1 by mouth once daily Prescriptions: LIPITOR 10 MG TABS (ATORVASTATIN CALCIUM) Take 1 by mouth once daily  #30 x 2   Entered by:   Jeremy Johann CMA   Authorized by:   Loreen Freud DO   Signed by:   Jeremy Johann CMA on 05/17/2010   Method used:   Faxed to ...       [facility] (retail)       [address]       [place], [place]  [address]       Ph: [phone]       Fax: [phone]   RxID:   [id number]

## 2010-12-04 NOTE — Progress Notes (Signed)
Summary: Results  Phone Note Outgoing Call   Call placed by: Almeta Monas CMA Duncan Dull),  September 26, 2010 2:29 PM Call placed to: Patient Details for Reason: Results Summary of Call: spoke with patient and I gave her the results of her Hgb A1C. Per Dr.Lowne DM  controlled continue meds and recheck in 3 mo... Pt agreed. sent copy of labs to be scanned... Almeta Monas CMA Duncan Dull)  September 26, 2010 2:37 PM

## 2010-12-04 NOTE — Letter (Signed)
Summary: Patient Notice- Polyp Results  [facility]  [address] [place], [place] [address]   Phone: [phone]  Fax: [phone]        June 14, 2010 MRN: [id number]    Jacqueline Duran [address] [place], [place]  [address]    Dear Ms. Whetstone,  I am pleased to inform you that the colon polyp(s) removed during your recent colonoscopy was (were) found to be benign (no cancer detected) upon pathologic examination.The polyps consisted of lymphatic tissue ( not premalignant)  I recommend you have a repeat colonoscopy examination in 10_ years to look for recurrent polyps, as having colon polyps increases your risk for having recurrent polyps or even colon cancer in the future.  Should you develop new or worsening symptoms of abdominal pain, bowel habit changes or bleeding from the rectum or bowels, please schedule an evaluation with either your primary care physician or with me.  Additional information/recommendations:  _x_ No further action with gastroenterology is needed at this time. Please      follow-up with your primary care physician for your other healthcare      needs.  __ Please call [phone] to schedule a return visit to review your      situation.  __ Please keep your follow-up visit as already scheduled.  __ Continue treatment plan as outlined the day of your exam.  Please call us if you are having persistent problems or have questions about your condition that have not been fully answered at this time.  Sincerely,  Hart Carwin MD  This letter has been electronically signed by your physician.  Appended Document: Patient Notice- Polyp Results letter mailed

## 2010-12-06 NOTE — Progress Notes (Signed)
Summary: exposed to strep throat  Phone Note Call from Patient Call back at [phone]   Caller: Patient Summary of Call: 3 year granddaughter--visiting since Sunday--left house today with fever of 104 --went to her doctor--she has strep throat--  Patient wants to know --how long is incubation period?   What symptoms does she look for??  please call her at [phone] Initial call taken by: Jerolyn Shin,  November 06, 2010 4:39 PM  Follow-up for Phone Call        Discuss with patient symptoms to watch for......Marland KitchenFelecia Deloach CMA  November 06, 2010 4:54 PM

## 2010-12-19 ENCOUNTER — Ambulatory Visit (INDEPENDENT_AMBULATORY_CARE_PROVIDER_SITE_OTHER): Payer: Managed Care, Other (non HMO) | Admitting: Family Medicine

## 2010-12-19 ENCOUNTER — Encounter: Payer: Self-pay | Admitting: Family Medicine

## 2010-12-19 DIAGNOSIS — E785 Hyperlipidemia, unspecified: Secondary | ICD-10-CM

## 2010-12-19 DIAGNOSIS — E119 Type 2 diabetes mellitus without complications: Secondary | ICD-10-CM

## 2010-12-19 DIAGNOSIS — I10 Essential (primary) hypertension: Secondary | ICD-10-CM

## 2010-12-24 ENCOUNTER — Telehealth (INDEPENDENT_AMBULATORY_CARE_PROVIDER_SITE_OTHER): Payer: Self-pay | Admitting: *Deleted

## 2010-12-25 ENCOUNTER — Telehealth: Payer: Self-pay | Admitting: Internal Medicine

## 2010-12-26 NOTE — Assessment & Plan Note (Signed)
Summary: rto 3 moths/cbs   Vital Signs:  Patient profile:   60 year old female Weight:      255.8 pounds Pulse rate:   72 / minute Pulse rhythm:   regular BP sitting:   118 / 72  (left arm) Cuff size:   large  Vitals Entered By: Almeta Monas CMA Duncan Dull) (December 19, 2010 9:12 AM) CC: 3 mo f/u--- stopped Antara because it was causing gall bladder issues   History of Present Illness:  Hyperlipidemia follow-up      This is a 60 year old woman who presents for Hyperlipidemia follow-up.  The patient denies muscle aches, GI upset, abdominal pain, flushing, itching, constipation, diarrhea, and fatigue.  The patient denies the following symptoms: chest pain/pressure, exercise intolerance, dypsnea, palpitations, syncope, and pedal edema.  Compliance with medications (by patient report) has been near 100%.  Dietary compliance has been good.  The patient reports exercising occasionally.  Adjunctive measures currently used by the patient include ASA and fish oil supplements.    Hypertension follow-up      The patient also presents for Hypertension follow-up.  The patient denies lightheadedness, urinary frequency, headaches, edema, impotence, rash, and fatigue.  The patient denies the following associated symptoms: chest pain, chest pressure, exercise intolerance, dyspnea, palpitations, syncope, leg edema, and pedal edema.  Compliance with medications (by patient report) has been near 100%.  The patient reports that dietary compliance has been good.  The patient reports exercising occasionally.  Adjunctive measures currently used by the patient include salt restriction.    Type 1 diabetes mellitus follow-up      The patient is also here for Type 2 diabetes mellitus follow-up.  The patient denies polyuria, polydipsia, blurred vision, self managed hypoglycemia, hypoglycemia requiring help, weight loss, weight gain, and numbness of extremities.  The patient denies the following symptoms: neuropathic pain,  chest pain, vomiting, orthostatic symptoms, poor wound healing, intermittent claudication, vision loss, and foot ulcer.  Since the last visit the patient reports good dietary compliance, compliance with medications, not exercising regularly, and not monitoring blood glucose.  Since the last visit, the patient reports having had eye care by an ophthalmologist and foot care by a podiatrist.    Problems Prior to Update: 1)  Obesity  (ICD-278.00) 2)  Personal Hx Colonic Polyps  (ICD-V12.72) 3)  Nausea  (ICD-787.02) 4)  Abnormal Exam-biliary Tract  (ICD-793.3) 5)  Ruq Pain  (ICD-789.01) 6)  Postmenopausal Status  (ICD-V49.81) 7)  Hyperlipidemia  (ICD-272.4) 8)  Diabetes Mellitus, Type II  (ICD-250.00) 9)  Family History of Cad Female 1st Degree Relative <60  (ICD-V16.49) 10)  Family History of Alcoholism/addiction  (ICD-V61.41) 11)  Anxiety State, Unspecified  (ICD-300.00) 12)  Sinusitis - Acute-nos  (ICD-461.9) 13)  Edema  (ICD-782.3) 14)  Heel Pain, Right  (ICD-729.5) 15)  Preventive Health Care  (ICD-V70.0) 16)  Acute Frontal Sinusitis  (ICD-461.1) 17)  Acute Bronchitis  (ICD-466.0) 18)  Preventive Health Care  (ICD-V70.0) 19)  Disorder, Depressive Nec  (ICD-311) 20)  Leukocytosis Nos  (ICD-288.60) 21)  Preventive Health Care  (ICD-V70.0) 22)  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 1 Once Daily 2)  Triamterene-Hctz 37.5-25 Mg Tabs (Triamterene-Hctz) .Marland KitchenMarland KitchenMarland Kitchen  1 Two Times A Day 3)  Ascorbic Acid 500 Mg Tabs (Ascorbic Acid) .Marland Kitchen.. 1 Once Daily 4)  Potassium 99 Mg Tabs (Potassium) .Marland Kitchen.. 1 Once Daily 5)  Multivitamins  Caps (Multiple Vitamin) .Marland Kitchen.. 1 Once Daily 6)  Coq10 50 Gm Powd (Coenzyme Q10) 7)  Aspirin 81 Mg  Tabs (Aspirin) .Marland Kitchen.. 1 Once Daily 8)  Vitamin D 400 Unit Caps (Cholecalciferol) .... 3 Once Daily 9)  Fish Oil Triple Strength 1400 Mg Caps (Omega-3 Fatty Acids) .... Once Daily 10)  Lexapro 20 Mg Tabs (Escitalopram Oxalate) .Marland Kitchen.. 1 By Mouth Once Daily 11)  Fosinopril Sodium 20 Mg Tabs (Fosinopril Sodium) .... Take Two Times A Day 12)  Nasonex 50 Mcg/act Susp (Mometasone Furoate) .... 2 Sprays Each Nostril Once Daily 13)  Kombiglyze Xr 03-999 Mg Xr24h-Tab (Saxagliptin-Metformin) .Marland Kitchen.. 1 By Mouth Once Daily. 14)  Onetouch Delica Lancets  Misc (Lancets) .... Acc U Check Two Times A Day 15)  One Touch Ultra Mini Strips .... Accu Check Bid 16)  Antara 130 Mg Caps (Fenofibrate Micronized) .Marland Kitchen.. 1 By Mouth Daily. 17)  Lipitor 10 Mg Tabs (Atorvastatin Calcium) .... Take 1 By Mouth Once Daily 18)  Prilosec Otc 20 Mg Tbec (Omeprazole Magnesium) .Marland Kitchen.. 1 By Mouth Once Daily  Current Medications (verified): 1)  Folic Acid 1 Mg Tabs (Folic Acid) .Marland Kitchen.. 1 Once Daily 2)  Triamterene-Hctz 37.5-25 Mg Tabs (Triamterene-Hctz) .Marland Kitchen.. 1 Two Times A Day 3)  Ascorbic Acid 500 Mg Tabs (Ascorbic Acid) .Marland Kitchen.. 1 Once Daily 4)  Potassium 99 Mg Tabs (Potassium) .Marland Kitchen.. 1 Once Daily 5)  Multivitamins  Caps (Multiple Vitamin) .Marland Kitchen.. 1 Once Daily 6)  Coq10 50 Gm Powd (Coenzyme Q10) 7)  Aspirin 81 Mg Tabs (Aspirin) .Marland Kitchen.. 1 Once Daily 8)  Vitamin D 400 Unit Caps (Cholecalciferol) .... 3 Once Daily 9)  Fish Oil Triple Strength 1400 Mg Caps (Omega-3 Fatty Acids) .... Once Daily 10)  Lexapro 20 Mg Tabs (Escitalopram Oxalate) .Marland Kitchen.. 1 By Mouth Once Daily 11)  Fosinopril Sodium 20 Mg Tabs (Fosinopril Sodium) .... Take Two Times A Day 12)  Nasonex 50 Mcg/act Susp (Mometasone Furoate) .... 2 Sprays Each Nostril Once Daily 13)  Kombiglyze Xr 03-999 Mg Xr24h-Tab (Saxagliptin-Metformin) .Marland Kitchen.. 1 By Mouth Once Daily. 14)  Onetouch Delica Lancets  Misc (Lancets) .... Acc U Check Two Times A Day 15)  One Touch Ultra Mini Strips .... Accu Check Bid 16)   Lipitor 10 Mg Tabs (Atorvastatin Calcium) .... Take 1 By Mouth Once Daily 17)  Prilosec Otc 20 Mg Tbec (Omeprazole Magnesium) .Marland Kitchen.. 1 By Mouth Once Daily  Allergies (verified): 1)  ! Norvasc  Past History:  Past Medical History: Last updated: 09/25/2010 Headache Hypertension Deaf Right Ear Hyperlipidemia AODM OBESITY COLON POLYPS  Past Surgical History: Last updated: 09/25/2010 Hysterectomy (1985) Tonsillectomy Cervical fusion (07/2004)- cabbell COLONOSCOPY 8/11 BRODIE-HYPERPLASTIC POLYPS  Family History: Last updated: 05/16/2009 Family History Lung cancer--  F Family History MI Family History of Thromboembolism clotting disorder--- factor V ---sister and niece Family History of Alcoholism/Addiction Family History of CAD Female 1st degree relative--Mother Family History of Stroke F relative <60 Family History of Stroke M relative <50 Muncle--leukemia  Social History: Last updated: 05/04/2008 Occupation: Animal nutritionist- IT dept Married Never Smoked Alcohol use-no Drug use-no Regular exercise-no  Risk Factors: Alcohol Use: <1 (09/21/2010) >5 drinks/d w/in last 3 months: no (09/21/2010) Caffeine Use: 2 (09/21/2010) Exercise: yes (09/21/2010)  Risk Factors: Smoking Status: never (09/21/2010)  Family History: Reviewed history from 05/16/2009 and no changes required.  Family History Lung cancer--  F Family History MI Family History of Thromboembolism clotting disorder--- factor V ---sister and niece Family History of Alcoholism/Addiction Family History of CAD Female 1st degree relative--Mother Family History of Stroke F relative <60 Family History of Stroke M relative <50 Muncle--leukemia  Social History: Reviewed history from 05/04/2008 and no changes required. Occupation: Animal nutritionist- IT dept Married Never Smoked Alcohol use-no Drug use-no Regular exercise-no  Review of Systems      See HPI  Physical Exam  General:  Well-developed,well-nourished,in  no acute distress; alert,appropriate and cooperative throughout examination Lungs:  Normal respiratory effort, chest expands symmetrically. Lungs are clear to auscultation, no crackles or wheezes. Heart:  normal rate and no murmur.   Extremities:  No clubbing, cyanosis, edema, or deformity noted with normal full range of motion of all joints.   Psych:  Oriented X3 and normally interactive.    Diabetes Management Exam:    Foot Exam (with socks and/or shoes not present):       Sensory-Pinprick/Light touch:          Left medial foot (L-4): normal          Left dorsal foot (L-5): normal          Left lateral foot (S-1): normal          Right medial foot (L-4): normal          Right dorsal foot (L-5): normal          Right lateral foot (S-1): normal       Sensory-Monofilament:          Left foot: normal          Right foot: normal       Inspection:          Left foot: normal          Right foot: normal       Nails:          Left foot: normal          Right foot: normal    Eye Exam:       Eye Exam done elsewhere          Date: 01/17/2010          Results: normal          Done by: Dr Caron Presume point   Impression & Recommendations:  Problem # 1:  HYPERLIPIDEMIA (ICD-272.4)  The following medications were removed from the medication list:    Antara 130 Mg Caps (Fenofibrate micronized) .Marland Kitchen... 1 by mouth daily. Her updated medication list for this problem includes:    Lipitor 10 Mg Tabs (Atorvastatin calcium) .Marland Kitchen... Take 1 by mouth once daily  Orders: Venipuncture (42595) Specimen Handling (63875)  Labs Reviewed: SGOT: 28 (09/05/2010)   SGPT: 25 (09/05/2010)   HDL:38.7 (05/04/2008), 40.8 (10/08/2007)  LDL:85 (05/04/2008), 111 (64/33/2951)  Chol:154 (05/04/2008), 180 (10/08/2007)  Trig:151 (05/04/2008), 141 (10/08/2007)  Problem # 2:  DIABETES MELLITUS, TYPE II (ICD-250.00)  Her updated medication list for this problem includes:    Aspirin 81 Mg Tabs (Aspirin) .Marland Kitchen... 1 once  daily    Fosinopril Sodium 20 Mg Tabs (Fosinopril sodium) .Marland Kitchen... Take two times a day    Kombiglyze Xr 03-999 Mg Xr24h-tab (Saxagliptin-metformin) .Marland Kitchen... 1 by mouth once daily.  Orders: Venipuncture (88416) Specimen Handling (60630)  Labs Reviewed: Creat: 0.8 (09/05/2010)     Last Eye Exam: normal (01/17/2010) Reviewed HgBA1c results: 6.5 (10/08/2007)  Problem # 3:  HYPERTENSION (  ICD-401.9)  Her updated medication list for this problem includes:    Triamterene-hctz 37.5-25 Mg Tabs (Triamterene-hctz) .Marland Kitchen... 1 two times a day    Fosinopril Sodium 20 Mg Tabs (Fosinopril sodium) .Marland Kitchen... Take two times a day  Orders: Venipuncture (40102) Specimen Handling (72536)  BP today: 118/72 Prior BP: 118/56 (09/25/2010)  Labs Reviewed: K+: 4.6 (09/05/2010) Creat: : 0.8 (09/05/2010)   Chol: 154 (05/04/2008)   HDL: 38.7 (05/04/2008)   LDL: 85 (05/04/2008)   TG: 151 (05/04/2008)  Complete Medication List: 1)  Folic Acid 1 Mg Tabs (Folic acid) .Marland Kitchen.. 1 once daily 2)  Triamterene-hctz 37.5-25 Mg Tabs (Triamterene-hctz) .Marland Kitchen.. 1 two times a day 3)  Ascorbic Acid 500 Mg Tabs (Ascorbic acid) .Marland Kitchen.. 1 once daily 4)  Potassium 99 Mg Tabs (Potassium) .Marland Kitchen.. 1 once daily 5)  Multivitamins Caps (Multiple vitamin) .Marland Kitchen.. 1 once daily 6)  Coq10 50 Gm Powd (Coenzyme q10) 7)  Aspirin 81 Mg Tabs (Aspirin) .Marland Kitchen.. 1 once daily 8)  Vitamin D 400 Unit Caps (Cholecalciferol) .... 3 once daily 9)  Fish Oil Triple Strength 1400 Mg Caps (Omega-3 fatty acids) .... Once daily 10)  Lexapro 20 Mg Tabs (Escitalopram oxalate) .Marland Kitchen.. 1 by mouth once daily 11)  Fosinopril Sodium 20 Mg Tabs (Fosinopril sodium) .... Take two times a day 12)  Nasonex 50 Mcg/act Susp (Mometasone furoate) .... 2 sprays each nostril once daily 13)  Kombiglyze Xr 03-999 Mg Xr24h-tab (Saxagliptin-metformin) .Marland Kitchen.. 1 by mouth once daily. 14)  Onetouch Delica Lancets Misc (Lancets) .... Acc u check two times a day 15)  One Touch Ultra Mini Strips  .... Accu check  bid 16)  Lipitor 10 Mg Tabs (Atorvastatin calcium) .... Take 1 by mouth once daily 17)  Prilosec Otc 20 Mg Tbec (Omeprazole magnesium) .Marland Kitchen.. 1 by mouth once daily Prescriptions: LIPITOR 10 MG TABS (ATORVASTATIN CALCIUM) Take 1 by mouth once daily  #30 x 5   Entered and Authorized by:   Loreen Freud DO   Signed by:   Loreen Freud DO on 12/19/2010   Method used:   Electronically to        [facility] (retail)       [address]       [place], [place]  [address]       Ph: [phone]       Fax: [phone]   RxID:   [id number] KOMBIGLYZE XR 03-999 MG XR24H-TAB (SAXAGLIPTIN-METFORMIN) 1 by mouth once daily.  #30 x 5   Entered and Authorized by:   Loreen Freud DO   Signed by:   Loreen Freud DO on 12/19/2010   Method used:   Electronically to        [facility] (retail)       [address]       [place], [place]  [address]       Ph: [phone]       Fax: [phone]   RxID:   [id number]    Orders Added: 1)  Venipuncture [48546] 2)  Specimen Handling [99000] 3)  Est. Patient Level IV [27035]

## 2011-01-01 NOTE — Progress Notes (Signed)
Summary: Labs Results---lmom 2/20  Phone Note Outgoing Call   Call placed by: Almeta Monas CMA Duncan Dull),  December 24, 2010 2:04 PM Call placed to: Patient Details for Reason: Per Dr.Lowne labs are great, continue meds and recheck labs in 6 months. 272.4,250.00 bmp, hep,lipid, hgba1c Summary of Call: Left message to call back.... Almeta Monas CMA Duncan Dull)  December 24, 2010 2:08 PM  pt aware of the above..... Almeta Monas CMA Duncan Dull)  December 25, 2010 9:47 AM

## 2011-01-01 NOTE — Progress Notes (Signed)
Summary: triage  Phone Note Call from Patient Call back at [phone]   Caller: Patient Call For: Dr Juanda Chance Reason for Call: Talk to Nurse Summary of Call: Patient called to schedule appt but theres no available appt, states that she want to know what todo having problems with hemorroids. Initial call taken by: Tawni Levy,  December 25, 2010 2:54 PM  Follow-up for Phone Call        Spoke with patient. She is having some problems with hemorrhoids. She is having swelling, pain and some bleeding if the stool is hard. She is taking stool softners, Probiotics and using Preparation H without relief. She will try sitz baths three times a day, Tuck pads three times a day, and using baby wipes instead of tissue paper. Last colonoscopy 06/12/10-benign polyp.  Follow-up by: Jesse Fall RN,  December 25, 2010 3:30 PM  Additional Follow-up for Phone Call Additional follow up Details #1::        please send  Analpram cream 2.5%, #15 gm, apply three times a day to rectum, continue Preparation H at bedtime. ? see an extender? Additional Follow-up by: Hart Carwin MD,  December 25, 2010 10:33 PM    Additional Follow-up for Phone Call Additional follow up Details #2::    Spoke with patient and gave her Dr.Yuliza Cara's recommendations. She tried the sitz baths and this seem to help. She will try the Analpram but states she does not want to schedule a visit with an extender at this time. She will call back next week if not better and schedule with the extender.  Follow-up by: Jesse Fall RN,  December 26, 2010 9:07 AM  New/Updated Medications: ANALPRAM-HC 1-2.5 % CREA (HYDROCORTISONE ACE-PRAMOXINE) Apply to rectum three times a day Prescriptions: ANALPRAM-HC 1-2.5 % CREA (HYDROCORTISONE ACE-PRAMOXINE) Apply to rectum three times a day  #15 grams x 0   Entered by:   Jesse Fall RN   Authorized by:   Hart Carwin MD   Signed by:   Jesse Fall RN on 12/26/2010   Method used:   Electronically to          [facility] (retail)       [address]       [place], [place]  [address]       Ph: [phone]       Fax: [phone]   RxID:   [id number]

## 2011-01-18 LAB — GLUCOSE, CAPILLARY: Glucose-Capillary: 129 mg/dL — ABNORMAL HIGH (ref 70–99)

## 2011-02-12 ENCOUNTER — Other Ambulatory Visit: Payer: Self-pay | Admitting: Family Medicine

## 2011-02-12 DIAGNOSIS — Z1231 Encounter for screening mammogram for malignant neoplasm of breast: Secondary | ICD-10-CM

## 2011-03-15 ENCOUNTER — Ambulatory Visit
Admission: RE | Admit: 2011-03-15 | Discharge: 2011-03-15 | Disposition: A | Discharge status: Home / Self Care | Payer: Managed Care, Other (non HMO) | Source: Ambulatory Visit | Attending: Family Medicine | Admitting: Family Medicine

## 2011-03-15 DIAGNOSIS — Z1231 Encounter for screening mammogram for malignant neoplasm of breast: Secondary | ICD-10-CM

## 2011-03-22 NOTE — Op Note (Signed)
NAME:  Jacqueline Duran, Jacqueline Duran                         ACCOUNT NO.:  [id number]   MEDICAL RECORD NO.:  [id number]                   PATIENT TYPE:  INP   LOCATION:  2899                                 FACILITY:  [facility]   PHYSICIAN:  Coletta Memos, M.D.                  DATE OF BIRTH:  03/22/1951   DATE OF PROCEDURE:  06/13/2004  DATE OF DISCHARGE:                                 OPERATIVE REPORT   PREOPERATIVE DIAGNOSIS:  1. 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  2. Cervical spondylosis with myelopathy.  3. Cervical radiculopathy.  4. Cervical degenerative disc disease.   POSTOPERATIVE DIAGNOSIS:  1. 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  2. Cervical spondylosis with myelopathy.  3. Cervical radiculopathy.  4. Cervical degenerative disc disease.   INDICATIONS FOR PROCEDURE:  Jacqueline Duran is a 60 year old woman who  presented with pain in her right upper extremity.  MRI shows foraminal and  nerve root compression at C6-C7 and C7-T1 on the right side.  The pain  extends from the shoulder, right arm, forearm, and into the hand.  She has  normal strength in the upper and lower extremities.  MRI revealed large  osteophyte and disc complexes at C6-C7 and C7-T1 on the right side.  She  also has a disc at C5-C6 but that is mainly extrinsic to the left.  I,  therefore, recommended and she agreed to undergo an anterior cervical  decompression at C6-C7 and C7-T1 on the right.   OPERATIVE PROCEDURE:  1. Anterior cervical decompression C6-C7 and C7-T1.  2. Anterior arthrodesis C6 to T1 with two 7 mm Synthes Advance bone graft     filled with DBX puddy.  3. Anterior plating using Vectra 34 mm plate, two screws in C6, two in C7,     two in T1.   COMPLICATIONS:  None.   SURGEON:  Coletta Memos, M.D.   ASSISTANT:  Hewitt Shorts, M.D.   OPERATIVE NOTE:  Jacqueline Duran was brought to the operating room, intubated, and  placed under general anesthesia.  A Foley catheter  was placed under sterile  conditions.  She was positioned on the bed with a headrest and 10 pounds of  traction applied via chin strap.  Her arms were tucked to the side.  Her  neck was prepped and she was draped in a sterile fashion.  Using a  preoperative film as a guide for localizing, I opened the skin with a #10  blade and took that down to the platysma.  The incision was made from the  midline and extended to the medial border of the left sternocleidomastoid.  Dissecting through subcutaneous fat, I encountered the platysma and opened  that in a horizontal fashion.  I was able to expose the anterior cervical  spine.  Using the C-arm, we were able to localize the C5-C6 space and  counting down, I  was able to identify the C6-C7 space and subsequently the  C7-T1 space.  I then reflected the longus colli muscles bilaterally from C6  through T1.  I placed distraction pins, one in C6 and the other in C7.  I  opened the disc space with a #15 blade and then distracted the disc space  after placing self-retaining Caspar retractors.  With the microscope in  position, I then proceeded with the discectomy at C6-C7.  I removed a large  osteophyte, also, and decompressed both C7 nerve roots bilaterally.  When  that was done, I then placed a 7 mm Ace graft filled with DBX puddy.  I then  removed the distraction pin at C6 and replaced the retractors so that I was  exposing the C7-T1 space.  I placed a distraction screw into T1.  I  distracted that disc space and with the 15 blade opened the disc space.  Then, using a high speed drill and Kerrison punches, I was able to fully  decompress both C8 nerve roots.  I irrigated the wound.  I then placed a 7  mm Ace graft filled with puddy at the C7-T1 space.  With Dr. Earl Gala  assistance, we then placed the plate using the awl and placed screws, two 16  mm screws in C7, 14 mm screws in C6 and in T1.  The plate was then secured.  I did not take another  x-ray as we were not able to ever see the C6-C7 or C7-  T1 spaces with plane films.  I then irrigated the wound.  I closed the wound  in layered fashion reapproximating the platysma and then subcutaneous  tissue.  Dermabond was used for a sterile dressing.                                               Coletta Memos, M.D.    KC/MEDQ  D:  06/13/2004  T:  06/13/2004  Job:  [id number]

## 2011-04-03 ENCOUNTER — Ambulatory Visit (INDEPENDENT_AMBULATORY_CARE_PROVIDER_SITE_OTHER): Payer: Managed Care, Other (non HMO) | Admitting: Family Medicine

## 2011-04-03 ENCOUNTER — Encounter: Payer: Self-pay | Admitting: Family Medicine

## 2011-04-03 VITALS — BP 124/72 | HR 77 | Temp 99.0°F | Wt 261.4 lb

## 2011-04-03 DIAGNOSIS — J329 Chronic sinusitis, unspecified: Secondary | ICD-10-CM

## 2011-04-03 MED ORDER — CEFUROXIME AXETIL 500 MG PO TABS: 500.0000 mg | ORAL_TABLET | Freq: Two times a day (BID) | ORAL | Status: AC

## 2011-04-03 MED ORDER — HYDROCOD POLST-CHLORPHEN POLST 10-8 MG/5ML PO LQCR: 5.0000 mL | Freq: Every evening | ORAL | Status: DC | PRN

## 2011-04-03 MED ORDER — FLUTICASONE PROPIONATE 50 MCG/ACT NA SUSP: 2.0000 | Freq: Every day | NASAL | Status: DC

## 2011-04-03 NOTE — Progress Notes (Signed)
  Subjective:     Jacqueline Duran is a 60 y.o. female who presents for evaluation of symptoms of a URI. Symptoms include cough described as productive, nasal congestion, no  fever and sinus pressure. Onset of symptoms was 7 days ago, and has been gradually worsening since that time. Treatment to date: cough suppressants.  The following portions of the patient's history were reviewed and updated as appropriate: allergies, current medications, past family history, past medical history, past social history, past surgical history and problem list.  Review of Systems Pertinent items are noted in HPI.   Objective:    BP 124/72  Pulse 77  Temp(Src) 99 F (37.2 C) (Oral)  Wt 261 lb 6.4 oz (118.57 kg)  SpO2 95% General appearance: alert, cooperative, appears stated age and no distress Head: Normocephalic, without obvious abnormality, atraumatic Ears: normal TM's and external ear canals both ears Nose: Nares normal. Septum midline. Mucosa normal. No drainage or sinus tenderness., mild congestion, turbinates red, swollen, sinus tenderness bilateral Throat: abnormal findings: pnd Neck: mild anterior cervical adenopathy and thyroid not enlarged, symmetric, no tenderness/mass/nodules Lungs: clear to auscultation bilaterally Heart: regular rate and rhythm, S1, S2 normal, no murmur, click, rub or gallop Extremities: extremities normal, atraumatic, no cyanosis or edema   Assessment:    sinusitis   Plan:    Suggested symptomatic OTC remedies. Nasal saline spray for congestion. Ceftin per orders. Nasal steroids per orders. Follow up as needed. Call in 7 days if symptoms aren't resolving.

## 2011-05-20 ENCOUNTER — Encounter: Payer: Self-pay | Admitting: Family Medicine

## 2011-05-20 ENCOUNTER — Ambulatory Visit (INDEPENDENT_AMBULATORY_CARE_PROVIDER_SITE_OTHER): Payer: Managed Care, Other (non HMO) | Admitting: Family Medicine

## 2011-05-20 VITALS — BP 130/82 | HR 78 | Temp 98.6°F | Ht 64.25 in | Wt 258.6 lb

## 2011-05-20 DIAGNOSIS — E7210 Disorders of sulfur-bearing amino-acid metabolism, unspecified: Secondary | ICD-10-CM

## 2011-05-20 DIAGNOSIS — E039 Hypothyroidism, unspecified: Secondary | ICD-10-CM

## 2011-05-20 DIAGNOSIS — I10 Essential (primary) hypertension: Secondary | ICD-10-CM

## 2011-05-20 DIAGNOSIS — F329 Major depressive disorder, single episode, unspecified: Secondary | ICD-10-CM

## 2011-05-20 DIAGNOSIS — E785 Hyperlipidemia, unspecified: Secondary | ICD-10-CM

## 2011-05-20 DIAGNOSIS — E119 Type 2 diabetes mellitus without complications: Secondary | ICD-10-CM

## 2011-05-20 DIAGNOSIS — Z0000 Encounter for general adult medical examination without abnormal findings: Secondary | ICD-10-CM

## 2011-05-20 MED ORDER — TRIAMTERENE-HCTZ 37.5-25 MG PO TABS: 1.0000 | ORAL_TABLET | Freq: Two times a day (BID) | ORAL | Status: DC

## 2011-05-20 MED ORDER — ATORVASTATIN CALCIUM 10 MG PO TABS: 10.0000 mg | ORAL_TABLET | Freq: Every day | ORAL | Status: DC

## 2011-05-20 MED ORDER — SAXAGLIPTIN-METFORMIN ER 5-1000 MG PO TB24: 1.0000 | ORAL_TABLET | Freq: Every day | ORAL | Status: DC

## 2011-05-20 MED ORDER — FOLIC ACID 1 MG PO TABS: 1.0000 mg | ORAL_TABLET | Freq: Every day | ORAL | Status: DC

## 2011-05-20 MED ORDER — FOSINOPRIL SODIUM 20 MG PO TABS: 20.0000 mg | ORAL_TABLET | Freq: Two times a day (BID) | ORAL | Status: DC

## 2011-05-20 MED ORDER — ESCITALOPRAM OXALATE 20 MG PO TABS: 20.0000 mg | ORAL_TABLET | Freq: Every day | ORAL | Status: DC

## 2011-05-20 NOTE — Progress Notes (Signed)
  Subjective:     Jacqueline Duran is a 60 y.o. female and is here for a comprehensive physical exam. The patient reports no problems.  History   Social History  . Marital Status: Married    Spouse Name: N/A    Number of Children: N/A  . Years of Education: N/A   Occupational History  . IT support [employer]   Social History Main Topics  . Smoking status: Never Smoker   . Smokeless tobacco: Never Used  . Alcohol Use: No  . Drug Use: No  . Sexually Active: Yes -- Female partner(s)   Other Topics Concern  . Not on file   Social History Narrative  . No narrative on file   Health Maintenance  Topic Date Due  . Pap Smear  08/22/1971  . Colonoscopy  08/22/2003  . Influenza Vaccine  08/05/2011  . Mammogram  03/14/2013  . Tetanus/tdap  05/17/2019    The following portions of the patient's history were reviewed and updated as appropriate: allergies, current medications, past family history, past medical history, past social history, past surgical history and problem list.  Review of Systems Review of Systems  Constitutional: Negative for activity change, appetite change and fatigue.  HENT: Negative for hearing loss, congestion, tinnitus and ear discharge.  no dentist Eyes: Negative for visual disturbance (see optho q1y -- vision corrected to 20/20 with glasses).  Respiratory: Negative for cough, chest tightness and shortness of breath.   Cardiovascular: Negative for chest pain, palpitations and leg swelling.  Gastrointestinal: Negative for abdominal pain, diarrhea, constipation and abdominal distention.  Genitourinary: Negative for urgency, frequency, decreased urine volume and difficulty urinating.  Musculoskeletal: Negative for back pain, arthralgias and gait problem.  Skin: Negative for color change, pallor and rash.  Neurological: Negative for dizziness, light-headedness, numbness and headaches.  Hematological: Negative for adenopathy. Does not bruise/bleed easily.    Psychiatric/Behavioral: Negative for suicidal ideas, confusion, sleep disturbance, self-injury, dysphoric mood, decreased concentration and agitation.       Objective:    BP 130/82  Pulse 78  Temp(Src) 98.6 F (37 C) (Oral)  Ht 5' 4.25" (1.632 m)  Wt 258 lb 9.6 oz (117.3 kg)  BMI 44.04 kg/m2  SpO2 97% General appearance: alert, cooperative, appears stated age and no distress Head: Normocephalic, without obvious abnormality, atraumatic Eyes: conjunctivae/corneas clear. PERRL, EOM's intact. Fundi benign. Ears: normal TM's and external ear canals both ears Nose: Nares normal. Septum midline. Mucosa normal. No drainage or sinus tenderness. Throat: lips, mucosa, and tongue normal; teeth and gums normal Neck: no adenopathy, no carotid bruit, no JVD, supple, symmetrical, trachea midline and thyroid not enlarged, symmetric, no tenderness/mass/nodules Back: symmetric, no curvature. ROM normal. No CVA tenderness. Lungs: clear to auscultation bilaterally Breasts: normal appearance, no masses or tenderness Heart: regular rate and rhythm, S1, S2 normal, no murmur, click, rub or gallop Abdomen: soft, non-tender; bowel sounds normal; no masses,  no organomegaly Pelvic: deferred Extremities: extremities normal, atraumatic, no cyanosis or edema Pulses: 2+ and symmetric Skin: Skin color, texture, turgor normal. No rashes or lesions Lymph nodes: Cervical, supraclavicular, and axillary nodes normal. Neurologic: Grossly normal psych-- no depression / anxiety    Assessment:    Healthy female exam.  DMII-- check labs,  con't meds,  F/u optho, podiatry HTN--stable,  Cont' meds Hypothyroid--con't meds,  Check labs homocysteinemia--check labs,  con't meds   Plan:    ghm utd Check fasting labs See After Visit Summary for Counseling Recommendations

## 2011-05-20 NOTE — Progress Notes (Signed)
Addended by: Doristine Devoid on: 05/20/2011 10:20 AM   Modules accepted: Orders

## 2011-05-20 NOTE — Patient Instructions (Signed)

## 2011-05-24 ENCOUNTER — Telehealth: Payer: Self-pay

## 2011-05-24 NOTE — Telephone Encounter (Signed)
Called patient to discuss Labs from [facility]--- per Dr.Lowne CBC elevated and she wanted to repeat. HgbA1c is creeping up- work on diet and exercise, continue medications or we can discuss changing med to Victoza. Recheck labs in 3 months 250.00, 272.4 CBC-D,Hep, BMP, Lipid and HGBA1C.  Patient voiced understanding and stated she would follow up on VIctoza, will continue current meds for now. She stated her CBC had been elevated for a while and Dr.Ennever was handling that and he though it was due to a metal plate in her neck but she will come in and repeat. Scheduled for next Wednesday. Copy of labs mailed to the patient . KP

## 2011-05-28 ENCOUNTER — Other Ambulatory Visit: Payer: Self-pay | Admitting: Family Medicine

## 2011-05-28 DIAGNOSIS — D7289 Other specified disorders of white blood cells: Secondary | ICD-10-CM

## 2011-05-29 ENCOUNTER — Other Ambulatory Visit (INDEPENDENT_AMBULATORY_CARE_PROVIDER_SITE_OTHER): Payer: Managed Care, Other (non HMO)

## 2011-05-29 DIAGNOSIS — D7289 Other specified disorders of white blood cells: Secondary | ICD-10-CM

## 2011-05-29 LAB — CBC WITH DIFFERENTIAL/PLATELET
Basophils Absolute: 0 10*3/uL (ref 0.0–0.1)
Eosinophils Absolute: 0.4 10*3/uL (ref 0.0–0.7)
Lymphocytes Relative: 22.1 % (ref 12.0–46.0)
Monocytes Relative: 7 % (ref 3.0–12.0)
Neutrophils Relative %: 67.1 % (ref 43.0–77.0)
Platelets: 224 10*3/uL (ref 150.0–400.0)
RDW: 13.6 % (ref 11.5–14.6)

## 2011-05-29 NOTE — Progress Notes (Signed)
Labs only

## 2011-05-30 NOTE — Progress Notes (Signed)
Labs only

## 2011-05-31 NOTE — Progress Notes (Signed)
Lm 7/27

## 2011-06-17 ENCOUNTER — Encounter: Payer: Self-pay | Admitting: Family Medicine

## 2011-06-17 ENCOUNTER — Ambulatory Visit (INDEPENDENT_AMBULATORY_CARE_PROVIDER_SITE_OTHER): Payer: Managed Care, Other (non HMO) | Admitting: Family Medicine

## 2011-06-17 VITALS — BP 110/80 | HR 66 | Wt 262.2 lb

## 2011-06-17 DIAGNOSIS — R21 Rash and other nonspecific skin eruption: Secondary | ICD-10-CM

## 2011-06-17 DIAGNOSIS — E785 Hyperlipidemia, unspecified: Secondary | ICD-10-CM

## 2011-06-17 MED ORDER — METHYLPREDNISOLONE ACETATE 80 MG/ML IJ SUSP
80.0000 mg | Freq: Once | INTRAMUSCULAR | Status: AC
  Administered 2011-06-17: 80 mg via INTRAMUSCULAR

## 2011-06-17 MED ORDER — PREDNISONE 10 MG PO TABS: 10.0000 mg | ORAL_TABLET | Freq: Every day | ORAL | Status: AC

## 2011-06-17 MED ORDER — ATORVASTATIN CALCIUM 10 MG PO TABS: 10.0000 mg | ORAL_TABLET | Freq: Every day | ORAL | Status: DC

## 2011-06-17 NOTE — Patient Instructions (Addendum)
Rash-Generic Many things can cause a rash. We are not certain what is causing the rash that you have. Some causes include infection, allergic reactions, medications, and chemicals. Sometimes something in your home that comes in contact with your skin may cause the rash. These include pets, new soaps, cosmetics, and foods. HOME CARE INSTRUCTIONS  Avoid extreme heat or cold, unless otherwise instructed. This can make the itching worse.   A cool bath or shower or a cool washcloth can sometimes ease the itching.   Avoid scratching. This can cause infection.   Take those medications prescribed by your caregiver.  SEEK IMMEDIATE MEDICAL ATTENTION IF:  You develop increasing pain, swelling, or redness.   You develop a fever.   You develop new or severe symptoms such as body aches and pains, diarrhea, vomiting.   Your rash is not better in 3 days.  Document Released: 10/11/2002 Document Re-Released: 01/17/2009 [facility] Patient Information 2011 [place], [place].   Sliding scale insulin---check blood sugars 4x a day  200-250   2u  251-300  4 u 301-350  6 u 351-400  8 u >  400 10 u and call Dr

## 2011-06-17 NOTE — Progress Notes (Signed)
  Subjective:     Jacqueline Duran is a 61 y.o. female who presents for evaluation of a rash involving the forearm and back and neck.. Rash started 3 months ago. Lesions are pink, and raised in texture. Rash has changed over time. Rash is pruritic. Associated symptoms: none. Patient denies: abdominal pain, arthralgia, congestion, cough, crankiness, decrease in appetite, fever, headache, irritability, myalgia, nausea, sore throat and vomiting. Patient has not had contacts with similar rash. Patient has had new exposures (soaps, lotions, laundry detergents, foods, medications, plants, insects or animals).  Her Lipitor was switched to generic in June--- her lexapro was switched prior to that.  The following portions of the patient's history were reviewed and updated as appropriate: allergies, current medications, past family history, past medical history, past social history, past surgical history and problem list.  Review of Systems Pertinent items are noted in HPI.    Objective:    BP 110/80  Pulse 66  Wt 262 lb 3.2 oz (118.933 kg) General:  alert, cooperative, appears stated age and no distress  Skin:  papules noted on extremities and neck and back     Assessment:    dermatitis    - -- ? Etiology---may be from generic Lipitor DMII--- sliding scale insulin prn   Plan:    Medications: benadryl and steroids: prednisone taper. Written patient instruction given. depo medrol given

## 2011-07-16 ENCOUNTER — Ambulatory Visit (INDEPENDENT_AMBULATORY_CARE_PROVIDER_SITE_OTHER): Payer: Managed Care, Other (non HMO) | Admitting: Family Medicine

## 2011-07-16 ENCOUNTER — Encounter: Payer: Self-pay | Admitting: Family Medicine

## 2011-07-16 VITALS — BP 128/72 | HR 88 | Temp 99.2°F | Wt 264.6 lb

## 2011-07-16 DIAGNOSIS — L309 Dermatitis, unspecified: Secondary | ICD-10-CM

## 2011-07-16 DIAGNOSIS — L259 Unspecified contact dermatitis, unspecified cause: Secondary | ICD-10-CM

## 2011-07-16 MED ORDER — PREDNISONE 10 MG PO TABS: ORAL_TABLET | ORAL | Status: DC

## 2011-07-16 NOTE — Progress Notes (Signed)
  Subjective:    Patient ID: Jacqueline Duran, female    DOB: 1950-12-18, 60 y.o.   MRN: [id number]  HPI Pt here c/o rash returning after finishing prednisone taper.   It was better but returned after pred finished.     Review of Systems As directed    Objective:   Physical Exam + papular rash on both arms and back + escoriations       Assessment & Plan:  Dermatitis-- refer to derm and refill pred taper

## 2011-07-16 NOTE — Patient Instructions (Signed)
Contact Dermatitis Contact dermatitis is a reaction to certain substances that touch the skin. The substances may irritate the skin or cause an allergic reaction. Many substances can cause contact dermatitis. Common causes are cosmetics, jewelry, soaps, solvents and any number of chemicals. The area of skin that is exposed becomes dry, red, cracked, and itchy, and looks like a rash. In severe cases, blisters may develop. Symptoms (problems) can be controlled with treatment and by avoiding substances that caused the reaction. TREATMENT AND PREVENTION MEASURES  Keep the area of skin that is affected away from hot water, soap, sunlight, chemicals, acidic substances, or anything else that would irritate your skin. Do not rub the skin.   Use medications as directed.   You may use:   A topical steroid to reduce inflammation (redness or soreness) and anti-bacterial (germ) ointments for secondary infections.   Lubricants to keep moisture in your skin.   Burrow's solution to reduce inflammation or dry rash if weeping. Mix one packet or tablet in 2 cups cool water. Dip a clean washcloth in the mixture, wring it out a bit, and put it on the affected area. Leave in place for 30 minutes, then re-soak the washcloth and apply again. Do this as often as possible throughout the day.   If the area is too large to cover with a wash cloth, take several cornstarch or baking soda baths daily.   You may want to rest affected areas until less sore.  SEEK IMMEDIATE MEDICAL CARE IF:  An oral temperature above 100.4 develops.   You see signs of infection, such as swelling, tenderness, inflammation (redness or soreness), or warmth of affected area.   Treatment does not relieve your symptoms within 2 days, or as suggested by your caregiver.   You have any problems related to medication used.  Document Released: 10/18/2000 Document Re-Released: 04/10/2010 [facility] Patient Information 2011 [place], [place].

## 2011-07-17 ENCOUNTER — Telehealth: Payer: Self-pay | Admitting: *Deleted

## 2011-07-17 NOTE — Telephone Encounter (Signed)
Patient is allergic to the amlodipine. Please advise    KP

## 2011-07-17 NOTE — Telephone Encounter (Signed)
Stop fosinopril and maxzide-----start Diovan 160 mg 1 po qd----give 3 weeks samples----ov 2 weeks to check bp or sooner prn

## 2011-07-17 NOTE — Telephone Encounter (Signed)
Pt left VM that she saw derm today and they advise her that her rash may be due to a med reaction. Pt notes that derm advise her that she is currently take HTCZ which is a common med that causes allergic reaction. Pt would like to know if med can be changed. Please advise

## 2011-07-17 NOTE — Telephone Encounter (Signed)
D/c maxzide and start norvasc 5 mg #30  1 po qd  ---ov 2 weeks

## 2011-07-18 MED ORDER — VALSARTAN 160 MG PO TABS: 160.0000 mg | ORAL_TABLET | Freq: Every day | ORAL | Status: DC

## 2011-07-18 NOTE — Telephone Encounter (Signed)
Discussed with patient and she agreed to stop the Maxide and fosinopril and will start the Diovan. Samples left at check in and she will call back to schedule the follow up apt      KP

## 2011-07-24 ENCOUNTER — Telehealth: Payer: Self-pay | Admitting: *Deleted

## 2011-07-24 MED ORDER — SPIRONOLACTONE 25 MG PO TABS: 25.0000 mg | ORAL_TABLET | Freq: Every day | ORAL | Status: DC

## 2011-07-24 NOTE — Telephone Encounter (Signed)
Discuss with patient, Rx sent to pharmacy. 

## 2011-07-24 NOTE — Telephone Encounter (Signed)
Spironolactone 25mg   1 po qd #30  2 refills

## 2011-07-24 NOTE — Telephone Encounter (Signed)
Pt c/o elevated BP 158/85, swelling throughout body especially in legs. Pt notes that she just stop HTCZ, LIsinopril but has since started diovan which is not controlling BP. Pt would also like to know what she can take for swelling. Pt advise to make sure she is elevated legs when possible, Pt ok , Please advise

## 2011-07-30 ENCOUNTER — Ambulatory Visit (INDEPENDENT_AMBULATORY_CARE_PROVIDER_SITE_OTHER): Payer: Managed Care, Other (non HMO) | Admitting: Family Medicine

## 2011-07-30 ENCOUNTER — Encounter: Payer: Self-pay | Admitting: Family Medicine

## 2011-07-30 VITALS — BP 128/74 | HR 67 | Temp 98.7°F | Wt 270.6 lb

## 2011-07-30 DIAGNOSIS — I10 Essential (primary) hypertension: Secondary | ICD-10-CM

## 2011-07-30 NOTE — Progress Notes (Signed)
  Subjective:    Patient here for follow-up of elevated blood pressure.  She is not exercising and is adherent to a low-salt diet.  Blood pressure is well controlled at home. Cardiac symptoms: none. Patient denies: chest pain, chest pressure/discomfort, claudication, dyspnea, exertional chest pressure/discomfort, fatigue, irregular heart beat, lower extremity edema, near-syncope, orthopnea, palpitations, paroxysmal nocturnal dyspnea, syncope and tachypnea. Cardiovascular risk factors: diabetes mellitus, dyslipidemia, hypertension, obesity (BMI >= 30 kg/m2) and sedentary lifestyle. Use of agents associated with hypertension: none. History of target organ damage: none.  The following portions of the patient's history were reviewed and updated as appropriate: allergies, current medications, past family history, past medical history, past social history, past surgical history and problem list.  Review of Systems Pertinent items are noted in HPI.     Objective:    BP 128/74  Pulse 67  Temp(Src) 98.7 F (37.1 C) (Oral)  Wt 270 lb 9.6 oz (122.743 kg)  SpO2 94% General appearance: alert, cooperative, appears stated age and no distress Neck: no adenopathy, no carotid bruit, no JVD, supple, symmetrical, trachea midline and thyroid not enlarged, symmetric, no tenderness/mass/nodules Lungs: clear to auscultation bilaterally Heart: regular rate and rhythm, S1, S2 normal, no murmur, click, rub or gallop Extremities: extremities normal, atraumatic, no cyanosis or edema    Assessment:    Hypertension, normal blood pressure . Evidence of target organ damage: none.    Plan:    Medication: no change. Dietary sodium restriction. Regular aerobic exercise. Check blood pressures 2-3 times weekly and record. Follow up: 3 months and as needed.

## 2011-07-30 NOTE — Patient Instructions (Signed)

## 2011-10-07 ENCOUNTER — Encounter: Payer: Self-pay | Admitting: Family Medicine

## 2011-10-09 ENCOUNTER — Encounter: Payer: Self-pay | Admitting: Family Medicine

## 2011-10-09 ENCOUNTER — Telehealth: Payer: Self-pay | Admitting: Family Medicine

## 2011-10-09 ENCOUNTER — Ambulatory Visit (INDEPENDENT_AMBULATORY_CARE_PROVIDER_SITE_OTHER): Payer: Managed Care, Other (non HMO) | Admitting: Family Medicine

## 2011-10-09 VITALS — BP 124/90 | HR 71 | Temp 99.0°F | Wt 269.6 lb

## 2011-10-09 DIAGNOSIS — E7210 Disorders of sulfur-bearing amino-acid metabolism, unspecified: Secondary | ICD-10-CM

## 2011-10-09 DIAGNOSIS — E119 Type 2 diabetes mellitus without complications: Secondary | ICD-10-CM

## 2011-10-09 DIAGNOSIS — E785 Hyperlipidemia, unspecified: Secondary | ICD-10-CM

## 2011-10-09 DIAGNOSIS — R7983 Abnormal findings of blood amino-acid level: Secondary | ICD-10-CM

## 2011-10-09 DIAGNOSIS — I10 Essential (primary) hypertension: Secondary | ICD-10-CM

## 2011-10-09 LAB — POCT URINALYSIS DIPSTICK
Bilirubin, UA: NEGATIVE
Glucose, UA: NEGATIVE
Ketones, UA: NEGATIVE
Leukocytes, UA: NEGATIVE
Nitrite, UA: NEGATIVE

## 2011-10-09 LAB — HEPATIC FUNCTION PANEL
ALT: 21 U/L (ref 0–35)
Bilirubin, Direct: 0.1 mg/dL (ref 0.0–0.3)
Total Bilirubin: 0.5 mg/dL (ref 0.3–1.2)
Total Protein: 6.6 g/dL (ref 6.0–8.3)

## 2011-10-09 LAB — BASIC METABOLIC PANEL
CO2: 31 mEq/L (ref 19–32)
Calcium: 8.9 mg/dL (ref 8.4–10.5)
Creatinine, Ser: 0.9 mg/dL (ref 0.4–1.2)
GFR: 71.05 mL/min (ref >60.00)
Sodium: 142 mEq/L (ref 135–145)

## 2011-10-09 LAB — MICROALBUMIN / CREATININE URINE RATIO: Microalb Creat Ratio: 0.3 mg/g (ref 0.0–30.0)

## 2011-10-09 LAB — LIPID PANEL
Cholesterol: 114 mg/dL (ref 0–200)
HDL: 50.9 mg/dL (ref >39.00)
LDL Cholesterol: 45 mg/dL (ref 0–99)
Total CHOL/HDL Ratio: 2
Triglycerides: 92 mg/dL (ref 0.0–149.0)

## 2011-10-09 MED ORDER — ATORVASTATIN CALCIUM 10 MG PO TABS: ORAL_TABLET | ORAL | Status: DC

## 2011-10-09 MED ORDER — POTASSIUM 99 MG PO TABS: 1.0000 | ORAL_TABLET | Freq: Every day | ORAL | Status: DC

## 2011-10-09 MED ORDER — SAXAGLIPTIN-METFORMIN ER 5-1000 MG PO TB24: 1.0000 | ORAL_TABLET | Freq: Every day | ORAL | Status: DC

## 2011-10-09 MED ORDER — SPIRONOLACTONE 25 MG PO TABS: 25.0000 mg | ORAL_TABLET | Freq: Every day | ORAL | Status: DC

## 2011-10-09 MED ORDER — VALSARTAN 160 MG PO TABS: 160.0000 mg | ORAL_TABLET | Freq: Every day | ORAL | Status: DC

## 2011-10-09 MED ORDER — FOLIC ACID 1 MG PO TABS: 1.0000 mg | ORAL_TABLET | Freq: Every day | ORAL | Status: DC

## 2011-10-09 NOTE — Progress Notes (Signed)
  Subjective:    Patient ID: Jacqueline Duran, female    DOB: 1950-12-09, 60 y.o.   MRN: [id number]  HPI Pt here for f/u.    HYPERTENSION Disease Monitoring Blood pressure range-good Chest pain- no      Dyspnea- no Medications Compliance- good Lightheadedness- no   Edema- no   DIABETES Disease Monitoring Blood Sugar ranges-not checking Polyuria- no New Visual problems- no Medications Compliance- good Hypoglycemic symptoms- no   HYPERLIPIDEMIA Disease Monitoring See symptoms for Hypertension Medications Compliance- good RUQ pain- no  Muscle aches- no  ROS See HPI above   PMH Smoking Status noted     Review of Systems As above    Objective:   Physical Exam  Constitutional: She is oriented to person, place, and time. She appears well-developed and well-nourished.  Cardiovascular: Normal rate and regular rhythm.   Pulmonary/Chest: Effort normal and breath sounds normal.  Musculoskeletal: Normal range of motion. She exhibits edema. She exhibits no tenderness.  Neurological: She is alert and oriented to person, place, and time.  Psychiatric: She has a normal mood and affect. Her behavior is normal. Judgment and thought content normal.  monofilament----pt unable to feel bottom of feet       Assessment & Plan:

## 2011-10-09 NOTE — Telephone Encounter (Signed)
That's the one that was on her med list so that is what I refilled.   I was basically just trying to refill her K.

## 2011-10-09 NOTE — Assessment & Plan Note (Signed)
Check labs today con't meds 

## 2011-10-09 NOTE — Assessment & Plan Note (Signed)
Stable con't meds 

## 2011-10-09 NOTE — Assessment & Plan Note (Signed)
con't meds  Check labs 

## 2011-10-09 NOTE — Telephone Encounter (Signed)
They do not have it, would you like to switch it to something else. Please advise   KP

## 2011-10-09 NOTE — Patient Instructions (Signed)

## 2011-10-09 NOTE — Telephone Encounter (Signed)
Patty from [facility] called re potassium -- she said it doesn't come in 99mg  - needs calrification

## 2011-10-09 NOTE — Telephone Encounter (Signed)
kcl 20 meq  1 po qd #30    2 refills----- they told carol it didn't exist--- I m not sure why it was on her med list if they never filled it.

## 2011-10-09 NOTE — Telephone Encounter (Signed)
Please advise on change    KP

## 2011-10-10 MED ORDER — POTASSIUM CHLORIDE CRYS ER 20 MEQ PO TBCR: 20.0000 meq | EXTENDED_RELEASE_TABLET | Freq: Every day | ORAL | Status: DC

## 2011-10-10 NOTE — Telephone Encounter (Signed)
Faxed.   KP 

## 2012-01-21 ENCOUNTER — Other Ambulatory Visit: Payer: Self-pay | Admitting: Family Medicine

## 2012-01-21 DIAGNOSIS — Z1231 Encounter for screening mammogram for malignant neoplasm of breast: Secondary | ICD-10-CM

## 2012-02-17 LAB — HM DIABETES EYE EXAM

## 2012-03-16 ENCOUNTER — Ambulatory Visit
Admission: RE | Admit: 2012-03-16 | Discharge: 2012-03-16 | Disposition: A | Discharge status: Home / Self Care | Payer: BC Managed Care – PPO | Source: Ambulatory Visit | Attending: Family Medicine | Admitting: Family Medicine

## 2012-03-16 DIAGNOSIS — Z1231 Encounter for screening mammogram for malignant neoplasm of breast: Secondary | ICD-10-CM

## 2012-03-24 ENCOUNTER — Other Ambulatory Visit: Payer: Self-pay | Admitting: Family Medicine

## 2012-04-21 ENCOUNTER — Telehealth: Payer: Self-pay | Admitting: *Deleted

## 2012-04-21 NOTE — Telephone Encounter (Signed)
Prior Auth approved 03-25-12 until 12-19-14, approval letter scan to chart.

## 2012-06-04 ENCOUNTER — Ambulatory Visit (INDEPENDENT_AMBULATORY_CARE_PROVIDER_SITE_OTHER): Payer: BC Managed Care – PPO | Admitting: Family Medicine

## 2012-06-04 ENCOUNTER — Encounter: Payer: Self-pay | Admitting: Family Medicine

## 2012-06-04 VITALS — BP 124/78 | HR 80 | Temp 98.7°F | Wt 274.0 lb

## 2012-06-04 DIAGNOSIS — I10 Essential (primary) hypertension: Secondary | ICD-10-CM

## 2012-06-04 DIAGNOSIS — G4700 Insomnia, unspecified: Secondary | ICD-10-CM

## 2012-06-04 DIAGNOSIS — F329 Major depressive disorder, single episode, unspecified: Secondary | ICD-10-CM

## 2012-06-04 DIAGNOSIS — F32A Depression, unspecified: Secondary | ICD-10-CM

## 2012-06-04 DIAGNOSIS — R079 Chest pain, unspecified: Secondary | ICD-10-CM

## 2012-06-04 DIAGNOSIS — F3289 Other specified depressive episodes: Secondary | ICD-10-CM

## 2012-06-04 DIAGNOSIS — E119 Type 2 diabetes mellitus without complications: Secondary | ICD-10-CM

## 2012-06-04 DIAGNOSIS — Z0000 Encounter for general adult medical examination without abnormal findings: Secondary | ICD-10-CM

## 2012-06-04 DIAGNOSIS — E785 Hyperlipidemia, unspecified: Secondary | ICD-10-CM

## 2012-06-04 DIAGNOSIS — Z780 Asymptomatic menopausal state: Secondary | ICD-10-CM

## 2012-06-04 DIAGNOSIS — R7983 Abnormal findings of blood amino-acid level: Secondary | ICD-10-CM

## 2012-06-04 DIAGNOSIS — J329 Chronic sinusitis, unspecified: Secondary | ICD-10-CM

## 2012-06-04 DIAGNOSIS — E7210 Disorders of sulfur-bearing amino-acid metabolism, unspecified: Secondary | ICD-10-CM

## 2012-06-04 DIAGNOSIS — Z23 Encounter for immunization: Secondary | ICD-10-CM

## 2012-06-04 LAB — BASIC METABOLIC PANEL
BUN: 14 mg/dL (ref 6–23)
CO2: 32 mEq/L (ref 19–32)
Calcium: 8.9 mg/dL (ref 8.4–10.5)
Chloride: 102 mEq/L (ref 96–112)
Creatinine, Ser: 0.7 mg/dL (ref 0.4–1.2)
GFR: 92.62 mL/min (ref >60.00)
Glucose, Bld: 151 mg/dL — ABNORMAL HIGH (ref 70–99)
Potassium: 4.3 mEq/L (ref 3.5–5.1)
Sodium: 141 mEq/L (ref 135–145)

## 2012-06-04 LAB — POCT URINALYSIS DIPSTICK
Blood, UA: NEGATIVE
Glucose, UA: NEGATIVE
Nitrite, UA: NEGATIVE
Urobilinogen, UA: 0.2
pH, UA: 6

## 2012-06-04 LAB — HEPATIC FUNCTION PANEL
ALT: 37 U/L — ABNORMAL HIGH (ref 0–35)
AST: 33 U/L (ref 0–37)
Albumin: 3.8 g/dL (ref 3.5–5.2)
Alkaline Phosphatase: 88 U/L (ref 39–117)
Bilirubin, Direct: 0.1 mg/dL (ref 0.0–0.3)
Total Bilirubin: 0.7 mg/dL (ref 0.3–1.2)
Total Protein: 7.1 g/dL (ref 6.0–8.3)

## 2012-06-04 LAB — CBC WITH DIFFERENTIAL/PLATELET
Basophils Absolute: 0 10*3/uL (ref 0.0–0.1)
Basophils Relative: 0.3 % (ref 0.0–3.0)
Eosinophils Absolute: 0.4 10*3/uL (ref 0.0–0.7)
Eosinophils Relative: 3.8 % (ref 0.0–5.0)
HCT: 43.3 % (ref 36.0–46.0)
Hemoglobin: 14.2 g/dL (ref 12.0–15.0)
Lymphocytes Relative: 20.1 % (ref 12.0–46.0)
Lymphs Abs: 2.3 10*3/uL (ref 0.7–4.0)
MCHC: 32.8 g/dL (ref 30.0–36.0)
MCV: 94 fl (ref 78.0–100.0)
Monocytes Absolute: 0.8 10*3/uL (ref 0.1–1.0)
Monocytes Relative: 6.8 % (ref 3.0–12.0)
Neutro Abs: 7.8 10*3/uL — ABNORMAL HIGH (ref 1.4–7.7)
Neutrophils Relative %: 69 % (ref 43.0–77.0)
Platelets: 191 10*3/uL (ref 150.0–400.0)
RBC: 4.61 Mil/uL (ref 3.87–5.11)
RDW: 14 % (ref 11.5–14.6)
WBC: 11.3 10*3/uL — ABNORMAL HIGH (ref 4.5–10.5)

## 2012-06-04 LAB — MICROALBUMIN / CREATININE URINE RATIO
Creatinine,U: 155.1 mg/dL
Microalb Creat Ratio: 0.3 mg/g (ref 0.0–30.0)

## 2012-06-04 LAB — LIPID PANEL
Cholesterol: 110 mg/dL (ref 0–200)
LDL Cholesterol: 34 mg/dL (ref 0–99)

## 2012-06-04 MED ORDER — POTASSIUM CHLORIDE CRYS ER 20 MEQ PO TBCR: 20.0000 meq | EXTENDED_RELEASE_TABLET | Freq: Every day | ORAL | Status: DC

## 2012-06-04 MED ORDER — MELATONIN 3 MG PO CAPS: ORAL_CAPSULE | ORAL | Status: DC

## 2012-06-04 MED ORDER — FLUTICASONE PROPIONATE 50 MCG/ACT NA SUSP: 2.0000 | Freq: Every day | NASAL | Status: DC

## 2012-06-04 MED ORDER — POTASSIUM 99 MG PO TABS: 1.0000 | ORAL_TABLET | Freq: Every day | ORAL | Status: DC

## 2012-06-04 MED ORDER — LIPITOR 10 MG PO TABS: 10.0000 mg | ORAL_TABLET | Freq: Every day | ORAL | Status: DC

## 2012-06-04 MED ORDER — SAXAGLIPTIN-METFORMIN ER 5-1000 MG PO TB24: 1.0000 | ORAL_TABLET | Freq: Every day | ORAL | Status: DC

## 2012-06-04 MED ORDER — FOLIC ACID 1 MG PO TABS: 1.0000 mg | ORAL_TABLET | Freq: Every day | ORAL | Status: DC

## 2012-06-04 MED ORDER — ESCITALOPRAM OXALATE 20 MG PO TABS: 20.0000 mg | ORAL_TABLET | Freq: Every day | ORAL | Status: DC

## 2012-06-04 MED ORDER — VALSARTAN-HYDROCHLOROTHIAZIDE 160-25 MG PO TABS: 1.0000 | ORAL_TABLET | Freq: Every day | ORAL | Status: DC

## 2012-06-04 NOTE — Patient Instructions (Addendum)
Preventive Care for Adults, Female A healthy lifestyle and preventive care can promote health and wellness. Preventive health guidelines for women include the following key practices.  A routine yearly physical is a good way to check with your caregiver about your health and preventive screening. It is a chance to share any concerns and updates on your health, and to receive a thorough exam.   Visit your dentist for a routine exam and preventive care every 6 months. Brush your teeth twice a day and floss once a day. Good oral hygiene prevents tooth decay and gum disease.   The frequency of eye exams is based on your age, health, family medical history, use of contact lenses, and other factors. Follow your caregiver's recommendations for frequency of eye exams.   Eat a healthy diet. Foods like vegetables, fruits, whole grains, low-fat dairy products, and lean protein foods contain the nutrients you need without too many calories. Decrease your intake of foods high in solid fats, added sugars, and salt. Eat the right amount of calories for you.Get information about a proper diet from your caregiver, if necessary.   Regular physical exercise is one of the most important things you can do for your health. Most adults should get at least 150 minutes of moderate-intensity exercise (any activity that increases your heart rate and causes you to sweat) each week. In addition, most adults need muscle-strengthening exercises on 2 or more days a week.   Maintain a healthy weight. The body mass index (BMI) is a screening tool to identify possible weight problems. It provides an estimate of body fat based on height and weight. Your caregiver can help determine your BMI, and can help you achieve or maintain a healthy weight.For adults 20 years and older:   A BMI below 18.5 is considered underweight.   A BMI of 18.5 to 24.9 is normal.   A BMI of 25 to 29.9 is considered overweight.   A BMI of 30 and above is  considered obese.   Maintain normal blood lipids and cholesterol levels by exercising and minimizing your intake of saturated fat. Eat a balanced diet with plenty of fruit and vegetables. Blood tests for lipids and cholesterol should begin at age 20 and be repeated every 5 years. If your lipid or cholesterol levels are high, you are over 50, or you are at high risk for heart disease, you may need your cholesterol levels checked more frequently.Ongoing high lipid and cholesterol levels should be treated with medicines if diet and exercise are not effective.   If you smoke, find out from your caregiver how to quit. If you do not use tobacco, do not start.   If you are pregnant, do not drink alcohol. If you are breastfeeding, be very cautious about drinking alcohol. If you are not pregnant and choose to drink alcohol, do not exceed 1 drink per day. One drink is considered to be 12 ounces (355 mL) of beer, 5 ounces (148 mL) of wine, or 1.5 ounces (44 mL) of liquor.   Avoid use of street drugs. Do not share needles with anyone. Ask for help if you need support or instructions about stopping the use of drugs.   High blood pressure causes heart disease and increases the risk of stroke. Your blood pressure should be checked at least every 1 to 2 years. Ongoing high blood pressure should be treated with medicines if weight loss and exercise are not effective.   If you are 55 to 59   years old, ask your caregiver if you should take aspirin to prevent strokes.   Diabetes screening involves taking a blood sample to check your fasting blood sugar level. This should be done once every 3 years, after age 45, if you are within normal weight and without risk factors for diabetes. Testing should be considered at a younger age or be carried out more frequently if you are overweight and have at least 1 risk factor for diabetes.   Breast cancer screening is essential preventive care for women. You should practice "breast  self-awareness." This means understanding the normal appearance and feel of your breasts and may include breast self-examination. Any changes detected, no matter how small, should be reported to a caregiver. Women in their 20s and 30s should have a clinical breast exam (CBE) by a caregiver as part of a regular health exam every 1 to 3 years. After age 40, women should have a CBE every year. Starting at age 40, women should consider having a mammography (breast X-ray test) every year. Women who have a family history of breast cancer should talk to their caregiver about genetic screening. Women at a high risk of breast cancer should talk to their caregivers about having magnetic resonance imaging (MRI) and a mammography every year.   The Pap test is a screening test for cervical cancer. A Pap test can show cell changes on the cervix that might become cervical cancer if left untreated. A Pap test is a procedure in which cells are obtained and examined from the lower end of the uterus (cervix).   Women should have a Pap test starting at age 21.   Between ages 21 and 29, Pap tests should be repeated every 2 years.   Beginning at age 30, you should have a Pap test every 3 years as long as the past 3 Pap tests have been normal.   Some women have medical problems that increase the chance of getting cervical cancer. Talk to your caregiver about these problems. It is especially important to talk to your caregiver if a new problem develops soon after your last Pap test. In these cases, your caregiver may recommend more frequent screening and Pap tests.   The above recommendations are the same for women who have or have not gotten the vaccine for human papillomavirus (HPV).   If you had a hysterectomy for a problem that was not cancer or a condition that could lead to cancer, then you no longer need Pap tests. Even if you no longer need a Pap test, a regular exam is a good idea to make sure no other problems are  starting.   If you are between ages 65 and 70, and you have had normal Pap tests going back 10 years, you no longer need Pap tests. Even if you no longer need a Pap test, a regular exam is a good idea to make sure no other problems are starting.   If you have had past treatment for cervical cancer or a condition that could lead to cancer, you need Pap tests and screening for cancer for at least 20 years after your treatment.   If Pap tests have been discontinued, risk factors (such as a new sexual partner) need to be reassessed to determine if screening should be resumed.   The HPV test is an additional test that may be used for cervical cancer screening. The HPV test looks for the virus that can cause the cell changes on the cervix.   The cells collected during the Pap test can be tested for HPV. The HPV test could be used to screen women aged 30 years and older, and should be used in women of any age who have unclear Pap test results. After the age of 30, women should have HPV testing at the same frequency as a Pap test.   Colorectal cancer can be detected and often prevented. Most routine colorectal cancer screening begins at the age of 50 and continues through age 75. However, your caregiver may recommend screening at an earlier age if you have risk factors for colon cancer. On a yearly basis, your caregiver may provide home test kits to check for hidden blood in the stool. Use of a small camera at the end of a tube, to directly examine the colon (sigmoidoscopy or colonoscopy), can detect the earliest forms of colorectal cancer. Talk to your caregiver about this at age 50, when routine screening begins. Direct examination of the colon should be repeated every 5 to 10 years through age 75, unless early forms of pre-cancerous polyps or small growths are found.   Hepatitis C blood testing is recommended for all people born from 1945 through 1965 and any individual with known risks for hepatitis C.    Practice safe sex. Use condoms and avoid high-risk sexual practices to reduce the spread of sexually transmitted infections (STIs). STIs include gonorrhea, chlamydia, syphilis, trichomonas, herpes, HPV, and human immunodeficiency virus (HIV). Herpes, HIV, and HPV are viral illnesses that have no cure. They can result in disability, cancer, and death. Sexually active women aged 25 and younger should be checked for chlamydia. Older women with new or multiple partners should also be tested for chlamydia. Testing for other STIs is recommended if you are sexually active and at increased risk.   Osteoporosis is a disease in which the bones lose minerals and strength with aging. This can result in serious bone fractures. The risk of osteoporosis can be identified using a bone density scan. Women ages 65 and over and women at risk for fractures or osteoporosis should discuss screening with their caregivers. Ask your caregiver whether you should take a calcium supplement or vitamin D to reduce the rate of osteoporosis.   Menopause can be associated with physical symptoms and risks. Hormone replacement therapy is available to decrease symptoms and risks. You should talk to your caregiver about whether hormone replacement therapy is right for you.   Use sunscreen with sun protection factor (SPF) of 30 or more. Apply sunscreen liberally and repeatedly throughout the day. You should seek shade when your shadow is shorter than you. Protect yourself by wearing long sleeves, pants, a wide-brimmed hat, and sunglasses year round, whenever you are outdoors.   Once a month, do a whole body skin exam, using a mirror to look at the skin on your back. Notify your caregiver of new moles, moles that have irregular borders, moles that are larger than a pencil eraser, or moles that have changed in shape or color.   Stay current with required immunizations.   Influenza. You need a dose every fall (or winter). The composition of  the flu vaccine changes each year, so being vaccinated once is not enough.   Pneumococcal polysaccharide. You need 1 to 2 doses if you smoke cigarettes or if you have certain chronic medical conditions. You need 1 dose at age 65 (or older) if you have never been vaccinated.   Tetanus, diphtheria, pertussis (Tdap, Td). Get 1 dose of   Tdap vaccine if you are younger than age 65, are over 65 and have contact with an infant, are a healthcare worker, are pregnant, or simply want to be protected from whooping cough. After that, you need a Td booster dose every 10 years. Consult your caregiver if you have not had at least 3 tetanus and diphtheria-containing shots sometime in your life or have a deep or dirty wound.   HPV. You need this vaccine if you are a woman age 26 or younger. The vaccine is given in 3 doses over 6 months.   Measles, mumps, rubella (MMR). You need at least 1 dose of MMR if you were born in 1957 or later. You may also need a second dose.   Meningococcal. If you are age 19 to 21 and a first-year college student living in a residence hall, or have one of several medical conditions, you need to get vaccinated against meningococcal disease. You may also need additional booster doses.   Zoster (shingles). If you are age 60 or older, you should get this vaccine.   Varicella (chickenpox). If you have never had chickenpox or you were vaccinated but received only 1 dose, talk to your caregiver to find out if you need this vaccine.   Hepatitis A. You need this vaccine if you have a specific risk factor for hepatitis A virus infection or you simply wish to be protected from this disease. The vaccine is usually given as 2 doses, 6 to 18 months apart.   Hepatitis B. You need this vaccine if you have a specific risk factor for hepatitis B virus infection or you simply wish to be protected from this disease. The vaccine is given in 3 doses, usually over 6 months.  Preventive Services /  Frequency Ages 19 to 39  Blood pressure check.** / Every 1 to 2 years.   Lipid and cholesterol check.** / Every 5 years beginning at age 20.   Clinical breast exam.** / Every 3 years for women in their 20s and 30s.   Pap test.** / Every 2 years from ages 21 through 29. Every 3 years starting at age 30 through age 65 or 70 with a history of 3 consecutive normal Pap tests.   HPV screening.** / Every 3 years from ages 30 through ages 65 to 70 with a history of 3 consecutive normal Pap tests.   Hepatitis C blood test.** / For any individual with known risks for hepatitis C.   Skin self-exam. / Monthly.   Influenza immunization.** / Every year.   Pneumococcal polysaccharide immunization.** / 1 to 2 doses if you smoke cigarettes or if you have certain chronic medical conditions.   Tetanus, diphtheria, pertussis (Tdap, Td) immunization. / A one-time dose of Tdap vaccine. After that, you need a Td booster dose every 10 years.   HPV immunization. / 3 doses over 6 months, if you are 26 and younger.   Measles, mumps, rubella (MMR) immunization. / You need at least 1 dose of MMR if you were born in 1957 or later. You may also need a second dose.   Meningococcal immunization. / 1 dose if you are age 19 to 21 and a first-year college student living in a residence hall, or have one of several medical conditions, you need to get vaccinated against meningococcal disease. You may also need additional booster doses.   Varicella immunization.** / Consult your caregiver.   Hepatitis A immunization.** / Consult your caregiver. 2 doses, 6 to 18 months   apart.   Hepatitis B immunization.** / Consult your caregiver. 3 doses usually over 6 months.  Ages 40 to 64  Blood pressure check.** / Every 1 to 2 years.   Lipid and cholesterol check.** / Every 5 years beginning at age 20.   Clinical breast exam.** / Every year after age 40.   Mammogram.** / Every year beginning at age 40 and continuing for as  long as you are in good health. Consult with your caregiver.   Pap test.** / Every 3 years starting at age 30 through age 65 or 70 with a history of 3 consecutive normal Pap tests.   HPV screening.** / Every 3 years from ages 30 through ages 65 to 70 with a history of 3 consecutive normal Pap tests.   Fecal occult blood test (FOBT) of stool. / Every year beginning at age 50 and continuing until age 75. You may not need to do this test if you get a colonoscopy every 10 years.   Flexible sigmoidoscopy or colonoscopy.** / Every 5 years for a flexible sigmoidoscopy or every 10 years for a colonoscopy beginning at age 50 and continuing until age 75.   Hepatitis C blood test.** / For all people born from 1945 through 1965 and any individual with known risks for hepatitis C.   Skin self-exam. / Monthly.   Influenza immunization.** / Every year.   Pneumococcal polysaccharide immunization.** / 1 to 2 doses if you smoke cigarettes or if you have certain chronic medical conditions.   Tetanus, diphtheria, pertussis (Tdap, Td) immunization.** / A one-time dose of Tdap vaccine. After that, you need a Td booster dose every 10 years.   Measles, mumps, rubella (MMR) immunization. / You need at least 1 dose of MMR if you were born in 1957 or later. You may also need a second dose.   Varicella immunization.** / Consult your caregiver.   Meningococcal immunization.** / Consult your caregiver.   Hepatitis A immunization.** / Consult your caregiver. 2 doses, 6 to 18 months apart.   Hepatitis B immunization.** / Consult your caregiver. 3 doses, usually over 6 months.  Ages 65 and over  Blood pressure check.** / Every 1 to 2 years.   Lipid and cholesterol check.** / Every 5 years beginning at age 20.   Clinical breast exam.** / Every year after age 40.   Mammogram.** / Every year beginning at age 40 and continuing for as long as you are in good health. Consult with your caregiver.   Pap test.** /  Every 3 years starting at age 30 through age 65 or 70 with a 3 consecutive normal Pap tests. Testing can be stopped between 65 and 70 with 3 consecutive normal Pap tests and no abnormal Pap or HPV tests in the past 10 years.   HPV screening.** / Every 3 years from ages 30 through ages 65 or 70 with a history of 3 consecutive normal Pap tests. Testing can be stopped between 65 and 70 with 3 consecutive normal Pap tests and no abnormal Pap or HPV tests in the past 10 years.   Fecal occult blood test (FOBT) of stool. / Every year beginning at age 50 and continuing until age 75. You may not need to do this test if you get a colonoscopy every 10 years.   Flexible sigmoidoscopy or colonoscopy.** / Every 5 years for a flexible sigmoidoscopy or every 10 years for a colonoscopy beginning at age 50 and continuing until age 75.   Hepatitis   C blood test.** / For all people born from 1945 through 1965 and any individual with known risks for hepatitis C.   Osteoporosis screening.** / A one-time screening for women ages 65 and over and women at risk for fractures or osteoporosis.   Skin self-exam. / Monthly.   Influenza immunization.** / Every year.   Pneumococcal polysaccharide immunization.** / 1 dose at age 65 (or older) if you have never been vaccinated.   Tetanus, diphtheria, pertussis (Tdap, Td) immunization. / A one-time dose of Tdap vaccine if you are over 65 and have contact with an infant, are a healthcare worker, or simply want to be protected from whooping cough. After that, you need a Td booster dose every 10 years.   Varicella immunization.** / Consult your caregiver.   Meningococcal immunization.** / Consult your caregiver.   Hepatitis A immunization.** / Consult your caregiver. 2 doses, 6 to 18 months apart.   Hepatitis B immunization.** / Check with your caregiver. 3 doses, usually over 6 months.  ** Family history and personal history of risk and conditions may change your caregiver's  recommendations. Document Released: 12/17/2001 Document Revised: 10/10/2011 Document Reviewed: 03/18/2011 [employer] Patient Information 2012 [employer]. 

## 2012-06-04 NOTE — Assessment & Plan Note (Signed)
Check labs 

## 2012-06-04 NOTE — Assessment & Plan Note (Signed)
With some ekg changes ----will order stress test

## 2012-06-04 NOTE — Progress Notes (Signed)
  Subjective:     Jacqueline Duran is a 61 y.o. female and is here for a comprehensive physical exam. The patient reports no problems.  History   Social History  . Marital Status: Married    Spouse Name: N/A    Number of Children: N/A  . Years of Education: N/A   Occupational History  . unemployed    Social History Main Topics  . Smoking status: Never Smoker   . Smokeless tobacco: Never Used  . Alcohol Use: No  . Drug Use: No  . Sexually Active: Yes -- Female partner(s)   Other Topics Concern  . Not on file   Social History Narrative   Husband dx with dementia 12/2011   Health Maintenance  Topic Date Due  . Hemoglobin A1c  04/08/2012  . Influenza Vaccine  08/04/2012  . Foot Exam  10/08/2012  . Urine Microalbumin  10/08/2012  . Ophthalmology Exam  02/16/2013  . Mammogram  03/16/2014  . Pap Smear  05/19/2014  . Tetanus/tdap  05/17/2019  . Colonoscopy  05/19/2020    The following portions of the patient's history were reviewed and updated as appropriate: allergies, current medications, past family history, past medical history, past social history, past surgical history and problem list.  Review of Systems Review of Systems  Constitutional: Negative for activity change, appetite change and fatigue.  HENT: Negative for hearing loss, congestion, tinnitus and ear discharge.  dentist q52m Eyes: Negative for visual disturbance (see optho q1y -- vision corrected to 20/20 with glasses).  Respiratory: Negative for cough, chest tightness and shortness of breath.   Cardiovascular: Negative for chest pain, palpitations and leg swelling.  Gastrointestinal: Negative for abdominal pain, diarrhea, constipation and abdominal distention.  Genitourinary: Negative for urgency, frequency, decreased urine volume and difficulty urinating.  Musculoskeletal: Negative for back pain, arthralgias and gait problem.  Skin: Negative for color change, pallor and rash.  Neurological: Negative for dizziness,  light-headedness, numbness and headaches.  Hematological: Negative for adenopathy. Does not bruise/bleed easily.  Psychiatric/Behavioral: Negative for suicidal ideas, confusion, sleep disturbance, self-injury, dysphoric mood, decreased concentration and agitation.       Objective:    BP 124/78  Pulse 80  Temp 98.7 F (37.1 C) (Oral)  Wt 274 lb (124.286 kg)  SpO2 90% General appearance: alert, cooperative, appears stated age and no distress Head: Normocephalic, without obvious abnormality, atraumatic Eyes: conjunctivae/corneas clear. PERRL, EOM's intact. Fundi benign. Ears: normal TM's and external ear canals both ears Nose: Nares normal. Septum midline. Mucosa normal. No drainage or sinus tenderness. Throat: lips, mucosa, and tongue normal; teeth and gums normal Neck: no adenopathy, no carotid bruit, no JVD, supple, symmetrical, trachea midline and thyroid not enlarged, symmetric, no tenderness/mass/nodules Back: symmetric, no curvature. ROM normal. No CVA tenderness. Lungs: clear to auscultation bilaterally Breasts: normal appearance, no masses or tenderness Heart: S1, S2 normal Abdomen: soft, non-tender; bowel sounds normal; no masses,  no organomegaly Pelvic: deferred Extremities: extremities normal, atraumatic, no cyanosis or edema Pulses: 2+ and symmetric Skin: Skin color, texture, turgor normal. No rashes or lesions Lymph nodes: Cervical, supraclavicular, and axillary nodes normal. Neurologic: Alert and oriented X 3, normal strength and tone. Normal symmetric reflexes. Normal coordination and gait psych-- no anxiety, no depression    Assessment:    Healthy female exam.      Plan:    check labs ghm utd See After Visit Summary for Counseling Recommendations

## 2012-06-04 NOTE — Assessment & Plan Note (Signed)
Check labs con't meds 

## 2012-06-04 NOTE — Assessment & Plan Note (Signed)
Stable con't meds 

## 2012-06-10 ENCOUNTER — Telehealth: Payer: Self-pay

## 2012-06-10 MED ORDER — SAXAGLIPTIN-METFORMIN ER 2.5-1000 MG PO TB24: 2.0000 | ORAL_TABLET | Freq: Every day | ORAL | Status: DC

## 2012-06-10 NOTE — Telephone Encounter (Signed)
Discussed with patient and she voiced understanding--Rx sent to the pharmacy    KP 

## 2012-06-10 NOTE — Telephone Encounter (Signed)
Message copied by Arnette Norris on Wed Jun 10, 2012  5:02 PM ------      Message from: Lelon Perla      Created: Sun Jun 07, 2012  7:34 PM       Diabetes not controlled---  Marissa Calamity xr 2.5/ 1000 ----  2 po qd  #60  2 refills        Recheck 3 months-----   272.4  250.00  Lipid, hep, bmp, hgba1c, microalbumin, cbcd

## 2012-06-11 ENCOUNTER — Other Ambulatory Visit: Payer: BC Managed Care – PPO

## 2012-06-16 ENCOUNTER — Encounter (HOSPITAL_COMMUNITY): Payer: BC Managed Care – PPO

## 2012-07-13 ENCOUNTER — Encounter (HOSPITAL_COMMUNITY): Payer: BC Managed Care – PPO

## 2012-08-06 ENCOUNTER — Ambulatory Visit (INDEPENDENT_AMBULATORY_CARE_PROVIDER_SITE_OTHER): Payer: BC Managed Care – PPO

## 2012-08-06 DIAGNOSIS — Z23 Encounter for immunization: Secondary | ICD-10-CM

## 2012-11-13 ENCOUNTER — Ambulatory Visit: Payer: BC Managed Care – PPO | Admitting: Family Medicine

## 2012-11-16 ENCOUNTER — Encounter: Payer: Self-pay | Admitting: Family Medicine

## 2012-11-16 ENCOUNTER — Ambulatory Visit (INDEPENDENT_AMBULATORY_CARE_PROVIDER_SITE_OTHER): Payer: BC Managed Care – PPO | Admitting: Family Medicine

## 2012-11-16 VITALS — BP 122/80 | HR 66 | Temp 98.9°F | Wt 267.8 lb

## 2012-11-16 DIAGNOSIS — J329 Chronic sinusitis, unspecified: Secondary | ICD-10-CM

## 2012-11-16 DIAGNOSIS — J40 Bronchitis, not specified as acute or chronic: Secondary | ICD-10-CM

## 2012-11-16 MED ORDER — ALBUTEROL SULFATE HFA 108 (90 BASE) MCG/ACT IN AERS: 2.0000 | INHALATION_SPRAY | Freq: Four times a day (QID) | RESPIRATORY_TRACT | Status: DC | PRN

## 2012-11-16 MED ORDER — AMOXICILLIN-POT CLAVULANATE 875-125 MG PO TABS: 1.0000 | ORAL_TABLET | Freq: Two times a day (BID) | ORAL | Status: DC

## 2012-11-16 MED ORDER — FLUTICASONE PROPIONATE 50 MCG/ACT NA SUSP: 2.0000 | Freq: Every day | NASAL | Status: DC

## 2012-11-16 MED ORDER — PREDNISONE 10 MG PO TABS: ORAL_TABLET | ORAL | Status: DC

## 2012-11-16 MED ORDER — METHYLPREDNISOLONE ACETATE 80 MG/ML IJ SUSP
80.0000 mg | Freq: Once | INTRAMUSCULAR | Status: AC
  Administered 2012-11-16: 80 mg via INTRAMUSCULAR

## 2012-11-16 MED ORDER — HYDROCOD POLST-CHLORPHEN POLST 10-8 MG/5ML PO LQCR: ORAL | Status: DC

## 2012-11-16 MED ORDER — ALBUTEROL SULFATE (2.5 MG/3ML) 0.083% IN NEBU
2.5000 mg | INHALATION_SOLUTION | Freq: Once | RESPIRATORY_TRACT | Status: AC
  Administered 2012-11-16: 2.5 mg via RESPIRATORY_TRACT

## 2012-11-16 NOTE — Addendum Note (Signed)
Addended by: Arnette Norris on: 11/16/2012 06:18 PM   Modules accepted: Orders

## 2012-11-16 NOTE — Patient Instructions (Addendum)

## 2012-11-16 NOTE — Progress Notes (Signed)
Subjective:     Jacqueline Duran is a 62 y.o. female here for evaluation of a cough. Onset of symptoms was 1 week ago. Symptoms have been gradually worsening since that time. The cough is barky and productive and is aggravated by infection, pollens and reclining position. Associated symptoms include: shortness of breath, sputum production and wheezing. Patient does not have a history of asthma. Patient does have a history of environmental allergens. Patient has not traveled recently. Patient does not have a history of smoking. Patient has not had a previous chest x-ray. Patient has not had a PPD done.  The following portions of the patient's history were reviewed and updated as appropriate:  She  has a past medical history of Hypertension; Hyperlipidemia; Deafness; Diabetes mellitus; and Colon polyps. She  does not have any pertinent problems on file. She  has past surgical history that includes Abdominal hysterectomy; Tonsillectomy; Cervical fusion (07/2004); and Colonoscopy (8/11). Her family history includes Alcohol abuse in her other and unspecified family member; Cancer in her father; Clotting disorder in her sister; Coronary artery disease in her mother; Factor V Leiden deficiency in her other; Heart attack in an unspecified family member; Heart disease in her other; Leukemia in her maternal uncle; Lung cancer in her father; and Stroke in an unspecified family member. She  reports that she has never smoked. She has never used smokeless tobacco. She reports that she does not drink alcohol or use illicit drugs. She has a current medication list which includes the following prescription(s): ascorbic acid, aspirin, vitamin d, clobetasol, escitalopram, fish oil-omega-3 fatty acids, fluticasone, folic acid, lipitor, melatonin, multivitamin, omeprazole, onetouch delica lancets, potassium chloride sa, saxagliptin-metformin, and valsartan-hydrochlorothiazide. Current Outpatient Prescriptions on File Prior to  Visit  Medication Sig Dispense Refill  . ascorbic acid (VITAMIN C) 500 MG tablet Take 500 mg by mouth daily.        Marland Kitchen aspirin 81 MG tablet Take 81 mg by mouth daily.        . Cholecalciferol (VITAMIN D) 400 UNITS capsule Take 1,200 Units by mouth daily.        . clobetasol (TEMOVATE) 0.05 % external solution       . escitalopram (LEXAPRO) 20 MG tablet Take 1 tablet (20 mg total) by mouth daily.  90 tablet  3  . fish oil-omega-3 fatty acids 1000 MG capsule Take 1 g by mouth daily.        . fluticasone (FLONASE) 50 MCG/ACT nasal spray Place 2 sprays into the nose daily.  16 g  2  . folic acid (FOLVITE) 1 MG tablet Take 1 tablet (1 mg total) by mouth daily.  90 tablet  3  . LIPITOR 10 MG tablet Take 1 tablet (10 mg total) by mouth daily.  90 each  3  . Melatonin 3 MG CAPS 1 po qhs    0  . Multiple Vitamin (MULTIVITAMIN) tablet Take 1 tablet by mouth daily.        Marland Kitchen omeprazole (PRILOSEC OTC) 20 MG tablet Take 20 mg by mouth daily.        Letta Pate DELICA LANCETS MISC 1 Device by Does not apply route 2 (two) times daily.        . potassium chloride SA (K-DUR,KLOR-CON) 20 MEQ tablet Take 1 tablet (20 mEq total) by mouth daily.  90 tablet  3  . Saxagliptin-Metformin 2.03-999 MG TB24 Take 2 tablets by mouth daily.  60 tablet  2  . valsartan-hydrochlorothiazide (DIOVAN HCT) 160-25 MG  per tablet Take 1 tablet by mouth daily.  90 tablet  3   She is allergic to amlodipine besylate..  Review of Systems Pertinent items are noted in HPI.    Objective:    Oxygen saturation 95% on room air BP 122/80  Pulse 66  Temp 98.9 F (37.2 C) (Oral)  Wt 267 lb 12.8 oz (121.473 kg)  SpO2 95% General appearance: alert, cooperative, appears stated age and no distress Ears: normal TM's and external ear canals both ears Nose: green discharge, moderate congestion, turbinates red, swollen, sinus tenderness bilateral Throat: abnormal findings: mild oropharyngeal erythema Neck: no adenopathy, no carotid bruit, no  JVD, supple, symmetrical, trachea midline and thyroid not enlarged, symmetric, no tenderness/mass/nodules Lungs: wheezes bilaterally Extremities: extremities normal, atraumatic, no cyanosis or edema    Assessment:    Acute Bronchitis and Sinusitis    Plan:    Antibiotics per medication orders. Antitussives per medication orders. Avoid exposure to tobacco smoke and fumes. B-agonist inhaler. Call if shortness of breath worsens, blood in sputum, change in character of cough, development of fever or chills, inability to maintain nutrition and hydration. Avoid exposure to tobacco smoke and fumes. Steroid inhaler as ordered.

## 2012-12-14 ENCOUNTER — Ambulatory Visit (INDEPENDENT_AMBULATORY_CARE_PROVIDER_SITE_OTHER): Payer: Self-pay | Admitting: Family Medicine

## 2012-12-14 ENCOUNTER — Encounter: Payer: Self-pay | Admitting: Family Medicine

## 2012-12-14 VITALS — BP 122/72 | HR 91 | Temp 96.8°F | Wt 265.6 lb

## 2012-12-14 DIAGNOSIS — E119 Type 2 diabetes mellitus without complications: Secondary | ICD-10-CM

## 2012-12-14 DIAGNOSIS — E876 Hypokalemia: Secondary | ICD-10-CM

## 2012-12-14 DIAGNOSIS — F329 Major depressive disorder, single episode, unspecified: Secondary | ICD-10-CM

## 2012-12-14 DIAGNOSIS — E785 Hyperlipidemia, unspecified: Secondary | ICD-10-CM

## 2012-12-14 DIAGNOSIS — F3289 Other specified depressive episodes: Secondary | ICD-10-CM

## 2012-12-14 DIAGNOSIS — I10 Essential (primary) hypertension: Secondary | ICD-10-CM

## 2012-12-14 DIAGNOSIS — J40 Bronchitis, not specified as acute or chronic: Secondary | ICD-10-CM

## 2012-12-14 DIAGNOSIS — J329 Chronic sinusitis, unspecified: Secondary | ICD-10-CM

## 2012-12-14 LAB — CBC WITH DIFFERENTIAL/PLATELET
Basophils Absolute: 0 10*3/uL (ref 0.0–0.1)
Eosinophils Absolute: 0.4 10*3/uL (ref 0.0–0.7)
HCT: 40.8 % (ref 36.0–46.0)
Lymphs Abs: 2.8 10*3/uL (ref 0.7–4.0)
MCHC: 33.4 g/dL (ref 30.0–36.0)
Monocytes Absolute: 0.8 10*3/uL (ref 0.1–1.0)
Monocytes Relative: 6.7 % (ref 3.0–12.0)
Platelets: 212 10*3/uL (ref 150.0–400.0)
RDW: 13.2 % (ref 11.5–14.6)

## 2012-12-14 LAB — LIPID PANEL
LDL Cholesterol: 41 mg/dL (ref 0–99)
Total CHOL/HDL Ratio: 3
VLDL: 27.2 mg/dL (ref 0.0–40.0)

## 2012-12-14 LAB — BASIC METABOLIC PANEL
BUN: 13 mg/dL (ref 6–23)
CO2: 33 mEq/L — ABNORMAL HIGH (ref 19–32)
GFR: 79.09 mL/min (ref >60.00)
Glucose, Bld: 116 mg/dL — ABNORMAL HIGH (ref 70–99)
Potassium: 4.2 mEq/L (ref 3.5–5.1)

## 2012-12-14 LAB — HEPATIC FUNCTION PANEL
ALT: 25 U/L (ref 0–35)
AST: 23 U/L (ref 0–37)
Alkaline Phosphatase: 93 U/L (ref 39–117)
Bilirubin, Direct: 0 mg/dL (ref 0.0–0.3)
Total Bilirubin: 0.4 mg/dL (ref 0.3–1.2)

## 2012-12-14 LAB — HEMOGLOBIN A1C: Hgb A1c MFr Bld: 7.4 % — ABNORMAL HIGH (ref 4.6–6.5)

## 2012-12-14 LAB — POCT URINALYSIS DIPSTICK
Blood, UA: NEGATIVE
Nitrite, UA: NEGATIVE
Spec Grav, UA: 1.02
Urobilinogen, UA: 0.2
pH, UA: 6

## 2012-12-14 LAB — MICROALBUMIN / CREATININE URINE RATIO
Creatinine,U: 170.5 mg/dL
Microalb Creat Ratio: 0.4 mg/g (ref 0.0–30.0)

## 2012-12-14 MED ORDER — SAXAGLIPTIN-METFORMIN ER 2.5-1000 MG PO TB24: 2.0000 | ORAL_TABLET | Freq: Every day | ORAL | Status: DC

## 2012-12-14 MED ORDER — LIPITOR 10 MG PO TABS: 10.0000 mg | ORAL_TABLET | Freq: Every day | ORAL | Status: DC

## 2012-12-14 MED ORDER — ALBUTEROL SULFATE HFA 108 (90 BASE) MCG/ACT IN AERS: 2.0000 | INHALATION_SPRAY | Freq: Four times a day (QID) | RESPIRATORY_TRACT | Status: DC | PRN

## 2012-12-14 MED ORDER — FLUTICASONE PROPIONATE 50 MCG/ACT NA SUSP: 2.0000 | Freq: Every day | NASAL | Status: DC

## 2012-12-14 MED ORDER — ESCITALOPRAM OXALATE 20 MG PO TABS: 20.0000 mg | ORAL_TABLET | Freq: Every day | ORAL | Status: DC

## 2012-12-14 MED ORDER — POTASSIUM CHLORIDE ER 10 MEQ PO TBCR: 20.0000 meq | EXTENDED_RELEASE_TABLET | Freq: Two times a day (BID) | ORAL | Status: DC

## 2012-12-14 MED ORDER — VALSARTAN-HYDROCHLOROTHIAZIDE 160-25 MG PO TABS: 1.0000 | ORAL_TABLET | Freq: Every day | ORAL | Status: DC

## 2012-12-14 NOTE — Patient Instructions (Signed)

## 2012-12-14 NOTE — Assessment & Plan Note (Signed)
Check labs 

## 2012-12-14 NOTE — Assessment & Plan Note (Signed)
Stable Cont meds 

## 2012-12-14 NOTE — Progress Notes (Signed)
  Subjective:    Patient ID: Jacqueline Duran, female    DOB: [birth year], 62 y.o.   MRN: [id number]  HPI HYPERTENSION Disease Monitoring Blood pressure range-not check Chest pain- no      Dyspnea- no Medications Compliance- good Lightheadedness- no   Edema- yes   DIABETES Disease Monitoring Blood Sugar ranges-not checking  Polyuria- no New Visual problems- no Medications Compliance- good Hypoglycemic symptoms- no   HYPERLIPIDEMIA Disease Monitoring See symptoms for Hypertension Medications Compliance- good RUQ pain- no  Muscle aches- no  ROS See HPI above   PMH Smoking Status noted     Review of Systems As above    Objective:   Physical Exam  BP 122/72  Pulse 91  Temp(Src) 96.8 F (36 C) (Oral)  Wt 265 lb 9.6 oz (120.475 kg)  BMI 45.23 kg/m2  SpO2 95% General appearance: alert, cooperative and appears stated age Lungs: clear to auscultation bilaterally Heart: S1, S2 normal Extremities: extremities normal, atraumatic, no cyanosis or edema Neurologic: Grossly normal Sensory exam of the foot is normal, tested with the monofilament. Good pulses, no lesions or ulcers, good peripheral pulses.       Assessment & Plan:

## 2012-12-14 NOTE — Assessment & Plan Note (Signed)
Check labs Encouraged pt to check glucose at least a couple of times a week

## 2012-12-28 ENCOUNTER — Telehealth: Payer: Self-pay | Admitting: Hematology & Oncology

## 2012-12-28 NOTE — Telephone Encounter (Signed)
Pt called wants to schedule appointment with MD. Dr. Myna Hidalgo reviewed chart and said pt could see PA. Pt aware to schedule appointment with PA but she wants to wait until the second week in April to see Dr. Myna Hidalgo. I scheduled 02-10-13 appointemt. Pt is going to call me back for appointment details, she is going to pick up her kids.

## 2012-12-28 NOTE — Telephone Encounter (Signed)
Pt made 4-9 appointment wants to wait to see Dr. Myna Hidalgo

## 2012-12-29 ENCOUNTER — Telehealth: Payer: Self-pay | Admitting: *Deleted

## 2012-12-29 NOTE — Telephone Encounter (Signed)
Pt states that due to insurance some of her med are expensive and would like to be change to cheaper meds. Pt advise to obtain copy of her formulary so that we may determine which med are cheaper on her plan. Pt ok stating that she will get info and try to get it to [place] by next week. Meds are lexapro, lipitor, potassium

## 2012-12-29 NOTE — Telephone Encounter (Signed)
ok 

## 2013-01-25 ENCOUNTER — Telehealth: Payer: Self-pay | Admitting: *Deleted

## 2013-01-25 MED ORDER — FOLIC ACID 1 MG PO TABS: 1.0000 mg | ORAL_TABLET | Freq: Every day | ORAL | Status: DC

## 2013-01-25 NOTE — Telephone Encounter (Signed)
Please advise ok refill

## 2013-01-25 NOTE — Telephone Encounter (Signed)
Refill for 6 months. 

## 2013-01-29 ENCOUNTER — Telehealth: Payer: Self-pay | Admitting: Family Medicine

## 2013-01-29 DIAGNOSIS — E119 Type 2 diabetes mellitus without complications: Secondary | ICD-10-CM

## 2013-01-29 NOTE — Telephone Encounter (Signed)
Patient called back regarding this and wondered if we have received anything from the insurance regarding prior auth??

## 2013-02-01 NOTE — Telephone Encounter (Signed)
Prior auth denied Lipitor for brand name because there is no documentation that Pt is unable to take atorvastatin. Please advise ok to change Pt to generic   PA for kombiglyze is still in process awaiting determination.

## 2013-02-01 NOTE — Telephone Encounter (Signed)
D/w pt please

## 2013-02-01 NOTE — Telephone Encounter (Signed)
Inform pt that we will switch to janumet xr 100/1000 1 po qpm per insurance

## 2013-02-01 NOTE — Telephone Encounter (Signed)
Prior Auth denied kombiglyze. Pt must have tried metformin ( at maximally tolerated dose) combined with a certain other drug ( a preferred DPP-4 inhibitor such as Januvia, Tradjenta, , Juvisync); or a preferred combination product (janumet, janumet XR, Jentadueto).Please advise   Pt is ok with doing the generic Lipitor pharmacy verbally advise ok to fill for generic.

## 2013-02-03 MED ORDER — SITAGLIP PHOS-METFORMIN HCL ER 100-1000 MG PO TB24: 1.0000 | ORAL_TABLET | Freq: Every evening | ORAL | Status: DC

## 2013-02-03 NOTE — Telephone Encounter (Signed)
Patient has been made aware and voiced understanding.     KP 

## 2013-02-05 ENCOUNTER — Telehealth: Payer: Self-pay | Admitting: Hematology & Oncology

## 2013-02-05 NOTE — Telephone Encounter (Signed)
Patient called and cx 02/10/13 apt and resch for 03/10/13

## 2013-02-10 ENCOUNTER — Ambulatory Visit: Payer: Self-pay | Admitting: Hematology & Oncology

## 2013-02-10 ENCOUNTER — Other Ambulatory Visit: Payer: Self-pay | Admitting: Lab

## 2013-02-18 ENCOUNTER — Telehealth: Payer: Self-pay | Admitting: *Deleted

## 2013-02-18 NOTE — Telephone Encounter (Signed)
Prior Auth approved from 02-04-13 until 02-09-14.

## 2013-03-01 ENCOUNTER — Telehealth: Payer: Self-pay

## 2013-03-01 NOTE — Telephone Encounter (Signed)
PA initiated and approved from 02/04/13 until 02/09/14. Ref # [id number]. Copy sent to be scanned      KP

## 2013-03-02 ENCOUNTER — Telehealth: Payer: Self-pay | Admitting: Hematology & Oncology

## 2013-03-02 NOTE — Telephone Encounter (Signed)
Patient called and cx 03/10/13 apt and stated she will call back to resch °

## 2013-03-10 ENCOUNTER — Other Ambulatory Visit: Payer: Self-pay | Admitting: Lab

## 2013-03-10 ENCOUNTER — Ambulatory Visit: Payer: Self-pay | Admitting: Hematology & Oncology

## 2013-05-12 ENCOUNTER — Other Ambulatory Visit: Payer: Self-pay

## 2013-05-12 DIAGNOSIS — Z1231 Encounter for screening mammogram for malignant neoplasm of breast: Secondary | ICD-10-CM

## 2013-05-13 ENCOUNTER — Other Ambulatory Visit: Payer: Self-pay

## 2013-06-08 ENCOUNTER — Ambulatory Visit: Payer: BC Managed Care – PPO

## 2013-06-14 ENCOUNTER — Other Ambulatory Visit: Payer: Self-pay | Admitting: Family Medicine

## 2013-06-14 MED ORDER — CEPHALEXIN 500 MG PO CAPS: 500.0000 mg | ORAL_CAPSULE | Freq: Two times a day (BID) | ORAL | Status: DC

## 2013-06-24 ENCOUNTER — Ambulatory Visit
Admission: RE | Admit: 2013-06-24 | Discharge: 2013-06-24 | Disposition: A | Discharge status: Home / Self Care | Payer: BC Managed Care – PPO | Source: Ambulatory Visit

## 2013-06-24 DIAGNOSIS — Z1231 Encounter for screening mammogram for malignant neoplasm of breast: Secondary | ICD-10-CM

## 2013-07-06 ENCOUNTER — Encounter: Payer: BC Managed Care – PPO | Admitting: Family Medicine

## 2013-07-08 ENCOUNTER — Ambulatory Visit (INDEPENDENT_AMBULATORY_CARE_PROVIDER_SITE_OTHER): Payer: BC Managed Care – PPO | Admitting: Family Medicine

## 2013-07-08 ENCOUNTER — Encounter: Payer: Self-pay | Admitting: Family Medicine

## 2013-07-08 VITALS — BP 110/70 | HR 64 | Temp 98.1°F | Ht 64.0 in | Wt 271.2 lb

## 2013-07-08 DIAGNOSIS — IMO0002 Reserved for concepts with insufficient information to code with codable children: Secondary | ICD-10-CM

## 2013-07-08 DIAGNOSIS — IMO0001 Reserved for inherently not codable concepts without codable children: Secondary | ICD-10-CM

## 2013-07-08 DIAGNOSIS — J40 Bronchitis, not specified as acute or chronic: Secondary | ICD-10-CM

## 2013-07-08 DIAGNOSIS — E785 Hyperlipidemia, unspecified: Secondary | ICD-10-CM

## 2013-07-08 DIAGNOSIS — Z0000 Encounter for general adult medical examination without abnormal findings: Secondary | ICD-10-CM

## 2013-07-08 DIAGNOSIS — F3289 Other specified depressive episodes: Secondary | ICD-10-CM

## 2013-07-08 DIAGNOSIS — E669 Obesity, unspecified: Secondary | ICD-10-CM | POA: Insufficient documentation

## 2013-07-08 DIAGNOSIS — N390 Urinary tract infection, site not specified: Secondary | ICD-10-CM

## 2013-07-08 DIAGNOSIS — I10 Essential (primary) hypertension: Secondary | ICD-10-CM

## 2013-07-08 DIAGNOSIS — J329 Chronic sinusitis, unspecified: Secondary | ICD-10-CM

## 2013-07-08 DIAGNOSIS — E7210 Disorders of sulfur-bearing amino-acid metabolism, unspecified: Secondary | ICD-10-CM

## 2013-07-08 DIAGNOSIS — F32A Depression, unspecified: Secondary | ICD-10-CM

## 2013-07-08 DIAGNOSIS — F329 Major depressive disorder, single episode, unspecified: Secondary | ICD-10-CM

## 2013-07-08 DIAGNOSIS — E119 Type 2 diabetes mellitus without complications: Secondary | ICD-10-CM

## 2013-07-08 DIAGNOSIS — E876 Hypokalemia: Secondary | ICD-10-CM

## 2013-07-08 DIAGNOSIS — R7983 Abnormal findings of blood amino-acid level: Secondary | ICD-10-CM

## 2013-07-08 DIAGNOSIS — E1165 Type 2 diabetes mellitus with hyperglycemia: Secondary | ICD-10-CM

## 2013-07-08 LAB — POCT URINALYSIS DIPSTICK
Bilirubin, UA: NEGATIVE
Blood, UA: NEGATIVE
Ketones, UA: NEGATIVE
Protein, UA: NEGATIVE
Spec Grav, UA: 1.015
pH, UA: 6.5

## 2013-07-08 LAB — BASIC METABOLIC PANEL
Calcium: 9.3 mg/dL (ref 8.4–10.5)
Creatinine, Ser: 0.7 mg/dL (ref 0.4–1.2)
GFR: 92.28 mL/min (ref >60.00)
Glucose, Bld: 143 mg/dL — ABNORMAL HIGH (ref 70–99)
Sodium: 138 mEq/L (ref 135–145)

## 2013-07-08 LAB — LIPID PANEL
HDL: 43.2 mg/dL (ref >39.00)
LDL Cholesterol: 54 mg/dL (ref 0–99)
Total CHOL/HDL Ratio: 3
Triglycerides: 156 mg/dL — ABNORMAL HIGH (ref 0.0–149.0)
VLDL: 31.2 mg/dL (ref 0.0–40.0)

## 2013-07-08 LAB — CBC WITH DIFFERENTIAL/PLATELET
Basophils Absolute: 0.1 10*3/uL (ref 0.0–0.1)
Eosinophils Absolute: 0.4 10*3/uL (ref 0.0–0.7)
Hemoglobin: 14 g/dL (ref 12.0–15.0)
Lymphocytes Relative: 20.8 % (ref 12.0–46.0)
MCHC: 33.6 g/dL (ref 30.0–36.0)
Monocytes Relative: 7 % (ref 3.0–12.0)
Neutro Abs: 9.5 10*3/uL — ABNORMAL HIGH (ref 1.4–7.7)
Neutrophils Relative %: 68.8 % (ref 43.0–77.0)
RBC: 4.59 Mil/uL (ref 3.87–5.11)
RDW: 13.3 % (ref 11.5–14.6)

## 2013-07-08 LAB — MICROALBUMIN / CREATININE URINE RATIO: Microalb, Ur: 0.3 mg/dL (ref 0.0–1.9)

## 2013-07-08 LAB — HEMOGLOBIN A1C: Hgb A1c MFr Bld: 7.2 % — ABNORMAL HIGH (ref 4.6–6.5)

## 2013-07-08 LAB — HEPATIC FUNCTION PANEL
Alkaline Phosphatase: 88 U/L (ref 39–117)
Bilirubin, Direct: 0.1 mg/dL (ref 0.0–0.3)
Total Bilirubin: 0.8 mg/dL (ref 0.3–1.2)
Total Protein: 7.4 g/dL (ref 6.0–8.3)

## 2013-07-08 MED ORDER — ESCITALOPRAM OXALATE 20 MG PO TABS: 20.0000 mg | ORAL_TABLET | Freq: Every day | ORAL | Status: DC

## 2013-07-08 MED ORDER — ALBUTEROL SULFATE HFA 108 (90 BASE) MCG/ACT IN AERS: 2.0000 | INHALATION_SPRAY | Freq: Four times a day (QID) | RESPIRATORY_TRACT | Status: DC | PRN

## 2013-07-08 MED ORDER — FLUTICASONE PROPIONATE 50 MCG/ACT NA SUSP: 2.0000 | Freq: Every day | NASAL | Status: DC

## 2013-07-08 MED ORDER — FOLIC ACID 1 MG PO TABS: 1.0000 mg | ORAL_TABLET | Freq: Every day | ORAL | Status: DC

## 2013-07-08 MED ORDER — LIPITOR 10 MG PO TABS: 10.0000 mg | ORAL_TABLET | Freq: Every day | ORAL | Status: DC

## 2013-07-08 MED ORDER — ONETOUCH LANCETS MISC: Status: DC

## 2013-07-08 MED ORDER — VALSARTAN-HYDROCHLOROTHIAZIDE 160-25 MG PO TABS: 1.0000 | ORAL_TABLET | Freq: Every day | ORAL | Status: DC

## 2013-07-08 MED ORDER — POTASSIUM CHLORIDE ER 10 MEQ PO TBCR: 20.0000 meq | EXTENDED_RELEASE_TABLET | Freq: Two times a day (BID) | ORAL | Status: DC

## 2013-07-08 MED ORDER — SAXAGLIPTIN-METFORMIN ER 2.5-1000 MG PO TB24: 2.0000 | ORAL_TABLET | Freq: Every day | ORAL | Status: DC

## 2013-07-08 NOTE — Progress Notes (Signed)
Subjective:     Jacqueline Duran is a 62 y.o. female and is here for a comprehensive physical exam. The patient reports no problems.  History   Social History  . Marital Status: Married    Spouse Name: N/A    Number of Children: N/A  . Years of Education: N/A   Occupational History  . unemployed    Social History Main Topics  . Smoking status: Never Smoker   . Smokeless tobacco: Never Used  . Alcohol Use: No  . Drug Use: No  . Sexual Activity: Yes    Partners: Male   Other Topics Concern  . Not on file   Social History Narrative   Husband dx with dementia 12/2011   Health Maintenance  Topic Date Due  . Influenza Vaccine  06/04/2013  . Hemoglobin A1c  06/13/2013  . Urine Microalbumin  12/14/2013  . Ophthalmology Exam  03/17/2014  . Pap Smear  05/19/2014  . Foot Exam  07/08/2014  . Mammogram  06/25/2015  . Pneumococcal Polysaccharide Vaccine (#2) 06/04/2017  . Tetanus/tdap  05/17/2019  . Colonoscopy  05/19/2020    The following portions of the patient's history were reviewed and updated as appropriate:  She  has a past medical history of Hypertension; Hyperlipidemia; Deafness; Diabetes mellitus; and Colon polyps. She  does not have any pertinent problems on file. She  has past surgical history that includes Abdominal hysterectomy; Tonsillectomy; Cervical fusion (07/2004); and Colonoscopy (8/11). Her family history includes Alcohol abuse in her other and another family member; Cancer in her father; Clotting disorder in her sister; Coronary artery disease in her mother; Factor V Leiden deficiency in her other; Heart attack in an other family member; Heart disease in her other; Leukemia in her maternal uncle; Lung cancer in her father; Stroke in an other family member. She  reports that she has never smoked. She has never used smokeless tobacco. She reports that she does not drink alcohol or use illicit drugs. She has a current medication list which includes the following  prescription(s): albuterol, ascorbic acid, aspirin, vitamin d, escitalopram, fish oil-omega-3 fatty acids, fluticasone, folic acid, lipitor, multivitamin, omeprazole, onetouch delica lancets, potassium chloride, saxagliptin-metformin, and valsartan-hydrochlorothiazide. Current Outpatient Prescriptions on File Prior to Visit  Medication Sig Dispense Refill  . ascorbic acid (VITAMIN C) 500 MG tablet Take 500 mg by mouth daily.        Marland Kitchen aspirin 81 MG tablet Take 81 mg by mouth daily.        . Cholecalciferol (VITAMIN D) 400 UNITS capsule Take 1,200 Units by mouth daily.        . fish oil-omega-3 fatty acids 1000 MG capsule Take 1 g by mouth daily.        . Multiple Vitamin (MULTIVITAMIN) tablet Take 1 tablet by mouth daily.        Marland Kitchen omeprazole (PRILOSEC OTC) 20 MG tablet Take 20 mg by mouth daily.        Letta Pate DELICA LANCETS MISC 1 Device by Does not apply route 2 (two) times daily.         No current facility-administered medications on file prior to visit.   She is allergic to amlodipine besylate..  Review of Systems Review of Systems  Constitutional: Negative for activity change, appetite change and fatigue.  HENT: Negative for hearing loss, congestion, tinnitus and ear discharge.  dentist -due Eyes: Negative for visual disturbance (see optho q1y -- vision corrected to 20/20 with glasses).  Respiratory: Negative for  cough, chest tightness and shortness of breath.   Cardiovascular: Negative for chest pain, palpitations and leg swelling.  Gastrointestinal: Negative for abdominal pain, diarrhea, constipation and abdominal distention.  Genitourinary: Negative for urgency, frequency, decreased urine volume and difficulty urinating.  Musculoskeletal: Negative for back pain, arthralgias and gait problem.  Skin: Negative for color change, pallor and rash.  Neurological: Negative for dizziness, light-headedness, numbness and headaches.  Hematological: Negative for adenopathy. Does not  bruise/bleed easily.  Psychiatric/Behavioral: Negative for suicidal ideas, confusion, sleep disturbance, self-injury, dysphoric mood, decreased concentration and agitation.       Objective:    BP 110/70  Pulse 64  Temp(Src) 98.1 F (36.7 C) (Oral)  Ht 5\' 4"  (1.626 m)  Wt 271 lb 3.2 oz (123.016 kg)  BMI 46.53 kg/m2  SpO2 97% General appearance: alert, cooperative, appears stated age and no distress Head: Normocephalic, without obvious abnormality, atraumatic Eyes: negative findings: lids and lashes normal and pupils equal, round, reactive to light and accomodation Ears: normal TM's and external ear canals both ears Nose: Nares normal. Septum midline. Mucosa normal. No drainage or sinus tenderness. Throat: lips, mucosa, and tongue normal; teeth and gums normal Neck: no adenopathy, no carotid bruit, no JVD, supple, symmetrical, trachea midline and thyroid not enlarged, symmetric, no tenderness/mass/nodules Back: symmetric, no curvature. ROM normal. No CVA tenderness. Lungs: clear to auscultation bilaterally Breasts: normal appearance, no masses or tenderness Heart: regular rate and rhythm, S1, S2 normal, no murmur, click, rub or gallop Abdomen: soft, non-tender; bowel sounds normal; no masses,  no organomegaly Pelvic: not indicated; post-menopausal, no abnormal Pap smears in past Extremities: extremities normal, atraumatic, no cyanosis or edema Pulses: 2+ and symmetric Skin: Skin color, texture, turgor normal. No rashes or lesions Lymph nodes: Cervical, supraclavicular, and axillary nodes normal. Neurologic: Alert and oriented X 3, normal strength and tone. Normal symmetric reflexes. Normal coordination and gait Psych-- no depression, no anxiety      Assessment:    Healthy female exam.      Plan:    check labs  ghm utd See After Visit Summary for Counseling Recommendations

## 2013-07-08 NOTE — Assessment & Plan Note (Signed)
Stable con't meds 

## 2013-07-08 NOTE — Assessment & Plan Note (Signed)
Check labs Cont' meds 

## 2013-07-08 NOTE — Assessment & Plan Note (Signed)
Check labs 

## 2013-07-08 NOTE — Patient Instructions (Addendum)
Preventive Care for Adults, Female A healthy lifestyle and preventive care can promote health and wellness. Preventive health guidelines for women include the following key practices.  A routine yearly physical is a good way to check with your caregiver about your health and preventive screening. It is a chance to share any concerns and updates on your health, and to receive a thorough exam.  Visit your dentist for a routine exam and preventive care every 6 months. Brush your teeth twice a day and floss once a day. Good oral hygiene prevents tooth decay and gum disease.  The frequency of eye exams is based on your age, health, family medical history, use of contact lenses, and other factors. Follow your caregiver's recommendations for frequency of eye exams.  Eat a healthy diet. Foods like vegetables, fruits, whole grains, low-fat dairy products, and lean protein foods contain the nutrients you need without too many calories. Decrease your intake of foods high in solid fats, added sugars, and salt. Eat the right amount of calories for you.Get information about a proper diet from your caregiver, if necessary.  Regular physical exercise is one of the most important things you can do for your health. Most adults should get at least 150 minutes of moderate-intensity exercise (any activity that increases your heart rate and causes you to sweat) each week. In addition, most adults need muscle-strengthening exercises on 2 or more days a week.  Maintain a healthy weight. The body mass index (BMI) is a screening tool to identify possible weight problems. It provides an estimate of body fat based on height and weight. Your caregiver can help determine your BMI, and can help you achieve or maintain a healthy weight.For adults 20 years and older:  A BMI below 18.5 is considered underweight.  A BMI of 18.5 to 24.9 is normal.  A BMI of 25 to 29.9 is considered overweight.  A BMI of 30 and above is  considered obese.  Maintain normal blood lipids and cholesterol levels by exercising and minimizing your intake of saturated fat. Eat a balanced diet with plenty of fruit and vegetables. Blood tests for lipids and cholesterol should begin at age 20 and be repeated every 5 years. If your lipid or cholesterol levels are high, you are over 50, or you are at high risk for heart disease, you may need your cholesterol levels checked more frequently.Ongoing high lipid and cholesterol levels should be treated with medicines if diet and exercise are not effective.  If you smoke, find out from your caregiver how to quit. If you do not use tobacco, do not start.  If you are pregnant, do not drink alcohol. If you are breastfeeding, be very cautious about drinking alcohol. If you are not pregnant and choose to drink alcohol, do not exceed 1 drink per day. One drink is considered to be 12 ounces (355 mL) of beer, 5 ounces (148 mL) of wine, or 1.5 ounces (44 mL) of liquor.  Avoid use of street drugs. Do not share needles with anyone. Ask for help if you need support or instructions about stopping the use of drugs.  High blood pressure causes heart disease and increases the risk of stroke. Your blood pressure should be checked at least every 1 to 2 years. Ongoing high blood pressure should be treated with medicines if weight loss and exercise are not effective.  If you are 55 to 60 years old, ask your caregiver if you should take aspirin to prevent strokes.  Diabetes   screening involves taking a blood sample to check your fasting blood sugar level. This should be done once every 3 years, after age 45, if you are within normal weight and without risk factors for diabetes. Testing should be considered at a younger age or be carried out more frequently if you are overweight and have at least 1 risk factor for diabetes.  Breast cancer screening is essential preventive care for women. You should practice "breast  self-awareness." This means understanding the normal appearance and feel of your breasts and may include breast self-examination. Any changes detected, no matter how small, should be reported to a caregiver. Women in their 20s and 30s should have a clinical breast exam (CBE) by a caregiver as part of a regular health exam every 1 to 3 years. After age 40, women should have a CBE every year. Starting at age 40, women should consider having a mammography (breast X-ray test) every year. Women who have a family history of breast cancer should talk to their caregiver about genetic screening. Women at a high risk of breast cancer should talk to their caregivers about having magnetic resonance imaging (MRI) and a mammography every year.  The Pap test is a screening test for cervical cancer. A Pap test can show cell changes on the cervix that might become cervical cancer if left untreated. A Pap test is a procedure in which cells are obtained and examined from the lower end of the uterus (cervix).  Women should have a Pap test starting at age 21.  Between ages 21 and 29, Pap tests should be repeated every 2 years.  Beginning at age 30, you should have a Pap test every 3 years as long as the past 3 Pap tests have been normal.  Some women have medical problems that increase the chance of getting cervical cancer. Talk to your caregiver about these problems. It is especially important to talk to your caregiver if a new problem develops soon after your last Pap test. In these cases, your caregiver may recommend more frequent screening and Pap tests.  The above recommendations are the same for women who have or have not gotten the vaccine for human papillomavirus (HPV).  If you had a hysterectomy for a problem that was not cancer or a condition that could lead to cancer, then you no longer need Pap tests. Even if you no longer need a Pap test, a regular exam is a good idea to make sure no other problems are  starting.  If you are between ages 65 and 70, and you have had normal Pap tests going back 10 years, you no longer need Pap tests. Even if you no longer need a Pap test, a regular exam is a good idea to make sure no other problems are starting.  If you have had past treatment for cervical cancer or a condition that could lead to cancer, you need Pap tests and screening for cancer for at least 20 years after your treatment.  If Pap tests have been discontinued, risk factors (such as a new sexual partner) need to be reassessed to determine if screening should be resumed.  The HPV test is an additional test that may be used for cervical cancer screening. The HPV test looks for the virus that can cause the cell changes on the cervix. The cells collected during the Pap test can be tested for HPV. The HPV test could be used to screen women aged 30 years and older, and should   be used in women of any age who have unclear Pap test results. After the age of 30, women should have HPV testing at the same frequency as a Pap test.  Colorectal cancer can be detected and often prevented. Most routine colorectal cancer screening begins at the age of 50 and continues through age 75. However, your caregiver may recommend screening at an earlier age if you have risk factors for colon cancer. On a yearly basis, your caregiver may provide home test kits to check for hidden blood in the stool. Use of a small camera at the end of a tube, to directly examine the colon (sigmoidoscopy or colonoscopy), can detect the earliest forms of colorectal cancer. Talk to your caregiver about this at age 50, when routine screening begins. Direct examination of the colon should be repeated every 5 to 10 years through age 75, unless early forms of pre-cancerous polyps or small growths are found.  Hepatitis C blood testing is recommended for all people born from 1945 through 1965 and any individual with known risks for hepatitis C.  Practice  safe sex. Use condoms and avoid high-risk sexual practices to reduce the spread of sexually transmitted infections (STIs). STIs include gonorrhea, chlamydia, syphilis, trichomonas, herpes, HPV, and human immunodeficiency virus (HIV). Herpes, HIV, and HPV are viral illnesses that have no cure. They can result in disability, cancer, and death. Sexually active women aged 25 and younger should be checked for chlamydia. Older women with new or multiple partners should also be tested for chlamydia. Testing for other STIs is recommended if you are sexually active and at increased risk.  Osteoporosis is a disease in which the bones lose minerals and strength with aging. This can result in serious bone fractures. The risk of osteoporosis can be identified using a bone density scan. Women ages 65 and over and women at risk for fractures or osteoporosis should discuss screening with their caregivers. Ask your caregiver whether you should take a calcium supplement or vitamin D to reduce the rate of osteoporosis.  Menopause can be associated with physical symptoms and risks. Hormone replacement therapy is available to decrease symptoms and risks. You should talk to your caregiver about whether hormone replacement therapy is right for you.  Use sunscreen with sun protection factor (SPF) of 30 or more. Apply sunscreen liberally and repeatedly throughout the day. You should seek shade when your shadow is shorter than you. Protect yourself by wearing long sleeves, pants, a wide-brimmed hat, and sunglasses year round, whenever you are outdoors.  Once a month, do a whole body skin exam, using a mirror to look at the skin on your back. Notify your caregiver of new moles, moles that have irregular borders, moles that are larger than a pencil eraser, or moles that have changed in shape or color.  Stay current with required immunizations.  Influenza. You need a dose every fall (or winter). The composition of the flu vaccine  changes each year, so being vaccinated once is not enough.  Pneumococcal polysaccharide. You need 1 to 2 doses if you smoke cigarettes or if you have certain chronic medical conditions. You need 1 dose at age 65 (or older) if you have never been vaccinated.  Tetanus, diphtheria, pertussis (Tdap, Td). Get 1 dose of Tdap vaccine if you are younger than age 65, are over 65 and have contact with an infant, are a healthcare worker, are pregnant, or simply want to be protected from whooping cough. After that, you need a Td   booster dose every 10 years. Consult your caregiver if you have not had at least 3 tetanus and diphtheria-containing shots sometime in your life or have a deep or dirty wound.  HPV. You need this vaccine if you are a woman age 26 or younger. The vaccine is given in 3 doses over 6 months.  Measles, mumps, rubella (MMR). You need at least 1 dose of MMR if you were born in 1957 or later. You may also need a second dose.  Meningococcal. If you are age 19 to 21 and a first-year college student living in a residence hall, or have one of several medical conditions, you need to get vaccinated against meningococcal disease. You may also need additional booster doses.  Zoster (shingles). If you are age 60 or older, you should get this vaccine.  Varicella (chickenpox). If you have never had chickenpox or you were vaccinated but received only 1 dose, talk to your caregiver to find out if you need this vaccine.  Hepatitis A. You need this vaccine if you have a specific risk factor for hepatitis A virus infection or you simply wish to be protected from this disease. The vaccine is usually given as 2 doses, 6 to 18 months apart.  Hepatitis B. You need this vaccine if you have a specific risk factor for hepatitis B virus infection or you simply wish to be protected from this disease. The vaccine is given in 3 doses, usually over 6 months. Preventive Services / Frequency Ages 19 to 39  Blood  pressure check.** / Every 1 to 2 years.  Lipid and cholesterol check.** / Every 5 years beginning at age 20.  Clinical breast exam.** / Every 3 years for women in their 20s and 30s.  Pap test.** / Every 2 years from ages 21 through 29. Every 3 years starting at age 30 through age 65 or 70 with a history of 3 consecutive normal Pap tests.  HPV screening.** / Every 3 years from ages 30 through ages 65 to 70 with a history of 3 consecutive normal Pap tests.  Hepatitis C blood test.** / For any individual with known risks for hepatitis C.  Skin self-exam. / Monthly.  Influenza immunization.** / Every year.  Pneumococcal polysaccharide immunization.** / 1 to 2 doses if you smoke cigarettes or if you have certain chronic medical conditions.  Tetanus, diphtheria, pertussis (Tdap, Td) immunization. / A one-time dose of Tdap vaccine. After that, you need a Td booster dose every 10 years.  HPV immunization. / 3 doses over 6 months, if you are 26 and younger.  Measles, mumps, rubella (MMR) immunization. / You need at least 1 dose of MMR if you were born in 1957 or later. You may also need a second dose.  Meningococcal immunization. / 1 dose if you are age 19 to 21 and a first-year college student living in a residence hall, or have one of several medical conditions, you need to get vaccinated against meningococcal disease. You may also need additional booster doses.  Varicella immunization.** / Consult your caregiver.  Hepatitis A immunization.** / Consult your caregiver. 2 doses, 6 to 18 months apart.  Hepatitis B immunization.** / Consult your caregiver. 3 doses usually over 6 months. Ages 40 to 64  Blood pressure check.** / Every 1 to 2 years.  Lipid and cholesterol check.** / Every 5 years beginning at age 20.  Clinical breast exam.** / Every year after age 40.  Mammogram.** / Every year beginning at age 40   and continuing for as long as you are in good health. Consult with your  caregiver.  Pap test.** / Every 3 years starting at age 30 through age 65 or 70 with a history of 3 consecutive normal Pap tests.  HPV screening.** / Every 3 years from ages 30 through ages 65 to 70 with a history of 3 consecutive normal Pap tests.  Fecal occult blood test (FOBT) of stool. / Every year beginning at age 50 and continuing until age 75. You may not need to do this test if you get a colonoscopy every 10 years.  Flexible sigmoidoscopy or colonoscopy.** / Every 5 years for a flexible sigmoidoscopy or every 10 years for a colonoscopy beginning at age 50 and continuing until age 75.  Hepatitis C blood test.** / For all people born from 1945 through 1965 and any individual with known risks for hepatitis C.  Skin self-exam. / Monthly.  Influenza immunization.** / Every year.  Pneumococcal polysaccharide immunization.** / 1 to 2 doses if you smoke cigarettes or if you have certain chronic medical conditions.  Tetanus, diphtheria, pertussis (Tdap, Td) immunization.** / A one-time dose of Tdap vaccine. After that, you need a Td booster dose every 10 years.  Measles, mumps, rubella (MMR) immunization. / You need at least 1 dose of MMR if you were born in 1957 or later. You may also need a second dose.  Varicella immunization.** / Consult your caregiver.  Meningococcal immunization.** / Consult your caregiver.  Hepatitis A immunization.** / Consult your caregiver. 2 doses, 6 to 18 months apart.  Hepatitis B immunization.** / Consult your caregiver. 3 doses, usually over 6 months. Ages 65 and over  Blood pressure check.** / Every 1 to 2 years.  Lipid and cholesterol check.** / Every 5 years beginning at age 20.  Clinical breast exam.** / Every year after age 40.  Mammogram.** / Every year beginning at age 40 and continuing for as long as you are in good health. Consult with your caregiver.  Pap test.** / Every 3 years starting at age 30 through age 65 or 70 with a 3  consecutive normal Pap tests. Testing can be stopped between 65 and 70 with 3 consecutive normal Pap tests and no abnormal Pap or HPV tests in the past 10 years.  HPV screening.** / Every 3 years from ages 30 through ages 65 or 70 with a history of 3 consecutive normal Pap tests. Testing can be stopped between 65 and 70 with 3 consecutive normal Pap tests and no abnormal Pap or HPV tests in the past 10 years.  Fecal occult blood test (FOBT) of stool. / Every year beginning at age 50 and continuing until age 75. You may not need to do this test if you get a colonoscopy every 10 years.  Flexible sigmoidoscopy or colonoscopy.** / Every 5 years for a flexible sigmoidoscopy or every 10 years for a colonoscopy beginning at age 50 and continuing until age 75.  Hepatitis C blood test.** / For all people born from 1945 through 1965 and any individual with known risks for hepatitis C.  Osteoporosis screening.** / A one-time screening for women ages 65 and over and women at risk for fractures or osteoporosis.  Skin self-exam. / Monthly.  Influenza immunization.** / Every year.  Pneumococcal polysaccharide immunization.** / 1 dose at age 65 (or older) if you have never been vaccinated.  Tetanus, diphtheria, pertussis (Tdap, Td) immunization. / A one-time dose of Tdap vaccine if you are over   65 and have contact with an infant, are a healthcare worker, or simply want to be protected from whooping cough. After that, you need a Td booster dose every 10 years.  Varicella immunization.** / Consult your caregiver.  Meningococcal immunization.** / Consult your caregiver.  Hepatitis A immunization.** / Consult your caregiver. 2 doses, 6 to 18 months apart.  Hepatitis B immunization.** / Check with your caregiver. 3 doses, usually over 6 months. ** Family history and personal history of risk and conditions may change your caregiver's recommendations. Document Released: 12/17/2001 Document Revised: 01/13/2012  Document Reviewed: 03/18/2011 [employer] Patient Information 2014 [employer].  

## 2013-07-21 ENCOUNTER — Telehealth: Payer: Self-pay

## 2013-07-21 NOTE — Telephone Encounter (Signed)
Message copied by Arnette Norris on Wed Jul 21, 2013  3:10 PM ------      Message from: Lelon Perla      Created: Fri Jul 09, 2013 12:05 PM       TG high---- con't meds---add antara 90 mg 1 po qd #30  2 refills       DM not controlled--- con/t meds---add amaryl 2 mg 1 po qd  #30  2 refills      Recheck 3 months-----250.02   272.4  Lipid , hep, bmp, hgba1c, microalbumin ------

## 2013-07-23 MED ORDER — GLIMEPIRIDE 2 MG PO TABS: 2.0000 mg | ORAL_TABLET | Freq: Every day | ORAL | Status: DC

## 2013-07-23 MED ORDER — ANTARA 90 MG PO CAPS: 1.0000 | ORAL_CAPSULE | Freq: Every day | ORAL | Status: DC

## 2013-08-10 ENCOUNTER — Ambulatory Visit: Payer: Self-pay | Admitting: Podiatry

## 2013-08-16 ENCOUNTER — Ambulatory Visit (INDEPENDENT_AMBULATORY_CARE_PROVIDER_SITE_OTHER): Payer: BC Managed Care – PPO

## 2013-08-16 DIAGNOSIS — Z23 Encounter for immunization: Secondary | ICD-10-CM

## 2013-08-26 ENCOUNTER — Ambulatory Visit: Payer: BC Managed Care – PPO

## 2013-12-16 ENCOUNTER — Ambulatory Visit (INDEPENDENT_AMBULATORY_CARE_PROVIDER_SITE_OTHER): Payer: 59 | Admitting: Family Medicine

## 2013-12-16 ENCOUNTER — Encounter: Payer: Self-pay | Admitting: Family Medicine

## 2013-12-16 VITALS — BP 130/78 | HR 85 | Temp 98.4°F | Wt 269.6 lb

## 2013-12-16 DIAGNOSIS — S90822A Blister (nonthermal), left foot, initial encounter: Secondary | ICD-10-CM

## 2013-12-16 DIAGNOSIS — IMO0002 Reserved for concepts with insufficient information to code with codable children: Secondary | ICD-10-CM

## 2013-12-16 DIAGNOSIS — E1149 Type 2 diabetes mellitus with other diabetic neurological complication: Secondary | ICD-10-CM

## 2013-12-16 DIAGNOSIS — J111 Influenza due to unidentified influenza virus with other respiratory manifestations: Secondary | ICD-10-CM

## 2013-12-16 DIAGNOSIS — R52 Pain, unspecified: Secondary | ICD-10-CM

## 2013-12-16 DIAGNOSIS — E785 Hyperlipidemia, unspecified: Secondary | ICD-10-CM

## 2013-12-16 DIAGNOSIS — R509 Fever, unspecified: Secondary | ICD-10-CM

## 2013-12-16 LAB — POC INFLUENZA A&B (BINAX/QUICKVUE)
INFLUENZA B, POC: NEGATIVE
Influenza A, POC: NEGATIVE

## 2013-12-16 MED ORDER — OSELTAMIVIR PHOSPHATE 75 MG PO CAPS: 75.0000 mg | ORAL_CAPSULE | Freq: Two times a day (BID) | ORAL | Status: DC

## 2013-12-16 NOTE — Assessment & Plan Note (Signed)
F/u podiatry

## 2013-12-16 NOTE — Progress Notes (Signed)
  Subjective:     Jacqueline Duran is a 63 y.o. female who presents for evaluation of fever. She has had the fever for 3 days. Symptoms have been gradually worsening. Symptoms are described as fevers up to 102 degrees, and are worse in the morning. Associated symptoms are body aches and chills. Patient denies abdominal pain, diarrhea, fatigue, headache, nausea, otitis symptoms, poor appetite, URI symptoms, urinary tract symptoms and vomiting.  She has tried to alleviate the symptoms with acetaminophen and ibuprofen with moderate relief. The patient has no known comorbidities (structural heart/valvular disease, prosthetic joints, immunocompromised state, recent dental work, known abscesses).  The following portions of the patient's history were reviewed and updated as appropriate: allergies, current medications, past family history, past medical history, past social history, past surgical history and problem list.  Review of Systems Pertinent items are noted in HPI.   Objective:    BP 130/78  Pulse 85  Temp(Src) 98.4 F (36.9 C) (Oral)  Wt 269 lb 9.6 oz (122.29 kg)  SpO2 97% General appearance: alert, cooperative, appears stated age and no distress Head: Normocephalic, without obvious abnormality, atraumatic Ears: normal TM's and external ear canals both ears Nose: clear discharge, moderate congestion, turbinates red, swollen Throat: abnormal findings: mild oropharyngeal erythema and pnd Neck: mild anterior cervical adenopathy, supple, symmetrical, trachea midline and thyroid not enlarged, symmetric, no tenderness/mass/nodules Lungs: clear to auscultation bilaterally Heart: S1, S2 normal Extremities: extremities normal, atraumatic, no cyanosis or edema Lymph nodes: Cervical, supraclavicular, and axillary nodes normal.  L foot-- + callouses and blister big toe Assessment:    Fever is likely secondary to viral etiology. -- flu  Plan:    Supportive care with appropriate antipyretics and  fluids. tamiflu  rto or call prn

## 2013-12-16 NOTE — Patient Instructions (Signed)

## 2013-12-17 ENCOUNTER — Telehealth: Payer: Self-pay

## 2013-12-17 ENCOUNTER — Telehealth: Payer: Self-pay | Admitting: *Deleted

## 2013-12-17 NOTE — Telephone Encounter (Signed)
Relevant patient education assigned to patient using Emmi. ° °

## 2013-12-17 NOTE — Telephone Encounter (Signed)
Pt states she has a blood blister, began since she had the flu.  Pt called again said everything is okay, now the blister broke.  I called again and left a message to make an appt.

## 2014-01-06 ENCOUNTER — Other Ambulatory Visit: Payer: 59

## 2014-01-11 ENCOUNTER — Other Ambulatory Visit (INDEPENDENT_AMBULATORY_CARE_PROVIDER_SITE_OTHER): Payer: 59

## 2014-01-11 DIAGNOSIS — E1149 Type 2 diabetes mellitus with other diabetic neurological complication: Secondary | ICD-10-CM

## 2014-01-11 DIAGNOSIS — E785 Hyperlipidemia, unspecified: Secondary | ICD-10-CM

## 2014-01-11 LAB — LIPID PANEL
CHOL/HDL RATIO: 3
Cholesterol: 121 mg/dL (ref 0–200)
HDL: 43 mg/dL (ref >39.00)
LDL CALC: 47 mg/dL (ref 0–99)
TRIGLYCERIDES: 155 mg/dL — AB (ref 0.0–149.0)
VLDL: 31 mg/dL (ref 0.0–40.0)

## 2014-01-11 LAB — BASIC METABOLIC PANEL
BUN: 11 mg/dL (ref 6–23)
CHLORIDE: 103 meq/L (ref 96–112)
CO2: 30 meq/L (ref 19–32)
Calcium: 8.8 mg/dL (ref 8.4–10.5)
Creatinine, Ser: 0.8 mg/dL (ref 0.4–1.2)
GFR: 81.16 mL/min (ref >60.00)
Glucose, Bld: 128 mg/dL — ABNORMAL HIGH (ref 70–99)
Potassium: 3.9 mEq/L (ref 3.5–5.1)
SODIUM: 142 meq/L (ref 135–145)

## 2014-01-11 LAB — HEPATIC FUNCTION PANEL
ALK PHOS: 102 U/L (ref 39–117)
ALT: 19 U/L (ref 0–35)
AST: 16 U/L (ref 0–37)
Albumin: 3.5 g/dL (ref 3.5–5.2)
BILIRUBIN DIRECT: 0 mg/dL (ref 0.0–0.3)
BILIRUBIN TOTAL: 0.5 mg/dL (ref 0.3–1.2)
TOTAL PROTEIN: 6.9 g/dL (ref 6.0–8.3)

## 2014-01-11 LAB — MICROALBUMIN / CREATININE URINE RATIO
Creatinine,U: 187.8 mg/dL
MICROALB UR: 0.3 mg/dL (ref 0.0–1.9)
Microalb Creat Ratio: 0.2 mg/g (ref 0.0–30.0)

## 2014-01-11 LAB — POCT URINALYSIS DIPSTICK
BILIRUBIN UA: NEGATIVE
Glucose, UA: NEGATIVE
Ketones, UA: NEGATIVE
Leukocytes, UA: NEGATIVE
Nitrite, UA: NEGATIVE
PROTEIN UA: NEGATIVE
RBC UA: NEGATIVE
Spec Grav, UA: 1.025
Urobilinogen, UA: 0.2
pH, UA: 6

## 2014-01-11 LAB — HEMOGLOBIN A1C: Hgb A1c MFr Bld: 7.4 % — ABNORMAL HIGH (ref 4.6–6.5)

## 2014-01-18 ENCOUNTER — Telehealth: Payer: Self-pay

## 2014-01-18 DIAGNOSIS — E785 Hyperlipidemia, unspecified: Secondary | ICD-10-CM

## 2014-01-18 DIAGNOSIS — R7983 Abnormal findings of blood amino-acid level: Secondary | ICD-10-CM

## 2014-01-18 DIAGNOSIS — E119 Type 2 diabetes mellitus without complications: Secondary | ICD-10-CM

## 2014-01-18 DIAGNOSIS — F329 Major depressive disorder, single episode, unspecified: Secondary | ICD-10-CM

## 2014-01-18 DIAGNOSIS — F32A Depression, unspecified: Secondary | ICD-10-CM

## 2014-01-18 DIAGNOSIS — E7219 Other disorders of sulfur-bearing amino-acid metabolism: Secondary | ICD-10-CM

## 2014-01-18 DIAGNOSIS — I10 Essential (primary) hypertension: Secondary | ICD-10-CM

## 2014-01-18 DIAGNOSIS — E876 Hypokalemia: Secondary | ICD-10-CM

## 2014-01-18 MED ORDER — SAXAGLIPTIN-METFORMIN ER 2.5-1000 MG PO TB24: 2.0000 | ORAL_TABLET | Freq: Every day | ORAL | Status: DC

## 2014-01-18 MED ORDER — ESCITALOPRAM OXALATE 20 MG PO TABS: 20.0000 mg | ORAL_TABLET | Freq: Every day | ORAL | Status: DC

## 2014-01-18 MED ORDER — GLIMEPIRIDE 2 MG PO TABS: 2.0000 mg | ORAL_TABLET | Freq: Every day | ORAL | Status: DC

## 2014-01-18 MED ORDER — LIPITOR 10 MG PO TABS: 10.0000 mg | ORAL_TABLET | Freq: Every day | ORAL | Status: DC

## 2014-01-18 MED ORDER — VALSARTAN-HYDROCHLOROTHIAZIDE 160-25 MG PO TABS: 1.0000 | ORAL_TABLET | Freq: Every day | ORAL | Status: DC

## 2014-01-18 MED ORDER — ANTARA 90 MG PO CAPS: 1.0000 | ORAL_CAPSULE | Freq: Every day | ORAL | Status: DC

## 2014-01-18 MED ORDER — POTASSIUM CHLORIDE ER 10 MEQ PO TBCR: 20.0000 meq | EXTENDED_RELEASE_TABLET | Freq: Two times a day (BID) | ORAL | Status: DC

## 2014-01-18 MED ORDER — FOLIC ACID 1 MG PO TABS: 1.0000 mg | ORAL_TABLET | Freq: Every day | ORAL | Status: DC

## 2014-01-18 NOTE — Telephone Encounter (Signed)
Message copied by Ewing Schlein on Tue Jan 18, 2014  4:41 PM ------      Message from: Rosalita Chessman      Created: Thu Jan 13, 2014  9:32 PM       Increase amaryl to 4 mg #30  1 qd ,  2 refillls      Cholesterol--- LDL goal < 70,  HDL >40,  TG < 150.  Diet and exercise will increase HDL and decrease LDL and TG.  Fish,  Fish Oil, Flaxseed oil will also help increase the HDL and decrease Triglycerides.   Recheck labs in 3 months------- 272.4  240///02  Lipid, hep, bmp, hgba1c.             ------

## 2014-01-18 NOTE — Telephone Encounter (Signed)
Spoke with patient and she has not been taking the Amaryl. She never got the medication refilled. Per Dr.Lowne ok for the patient to take the 2 mg and we will recheck labs in 3 mos. Patient has agreed with recommendations.      KP

## 2014-06-02 ENCOUNTER — Other Ambulatory Visit: Payer: Self-pay

## 2014-06-02 DIAGNOSIS — Z1231 Encounter for screening mammogram for malignant neoplasm of breast: Secondary | ICD-10-CM

## 2014-06-27 ENCOUNTER — Ambulatory Visit: Payer: 59

## 2014-07-06 ENCOUNTER — Ambulatory Visit
Admission: RE | Admit: 2014-07-06 | Discharge: 2014-07-06 | Disposition: A | Discharge status: Home / Self Care | Payer: 59 | Source: Ambulatory Visit

## 2014-07-06 DIAGNOSIS — Z1231 Encounter for screening mammogram for malignant neoplasm of breast: Secondary | ICD-10-CM

## 2014-07-15 ENCOUNTER — Encounter: Payer: Self-pay | Admitting: Family Medicine

## 2014-07-15 ENCOUNTER — Ambulatory Visit (INDEPENDENT_AMBULATORY_CARE_PROVIDER_SITE_OTHER): Payer: 59 | Admitting: Family Medicine

## 2014-07-15 VITALS — BP 119/69 | HR 66 | Temp 99.0°F | Ht 64.0 in | Wt 269.8 lb

## 2014-07-15 DIAGNOSIS — Z0000 Encounter for general adult medical examination without abnormal findings: Secondary | ICD-10-CM

## 2014-07-15 DIAGNOSIS — E1159 Type 2 diabetes mellitus with other circulatory complications: Secondary | ICD-10-CM

## 2014-07-15 DIAGNOSIS — Z2911 Encounter for prophylactic immunotherapy for respiratory syncytial virus (RSV): Secondary | ICD-10-CM

## 2014-07-15 DIAGNOSIS — I10 Essential (primary) hypertension: Secondary | ICD-10-CM

## 2014-07-15 DIAGNOSIS — Z23 Encounter for immunization: Secondary | ICD-10-CM

## 2014-07-15 DIAGNOSIS — E785 Hyperlipidemia, unspecified: Secondary | ICD-10-CM

## 2014-07-15 LAB — BASIC METABOLIC PANEL
BUN: 13 mg/dL (ref 6–23)
CALCIUM: 9.2 mg/dL (ref 8.4–10.5)
CO2: 31 mEq/L (ref 19–32)
Chloride: 100 mEq/L (ref 96–112)
Creatinine, Ser: 0.7 mg/dL (ref 0.4–1.2)
GFR: 86.17 mL/min (ref >60.00)
GLUCOSE: 116 mg/dL — AB (ref 70–99)
Potassium: 4.4 mEq/L (ref 3.5–5.1)
SODIUM: 138 meq/L (ref 135–145)

## 2014-07-15 LAB — CBC WITH DIFFERENTIAL/PLATELET
BASOS ABS: 0 10*3/uL (ref 0.0–0.1)
BASOS PCT: 0.3 % (ref 0.0–3.0)
Eosinophils Absolute: 0.3 10*3/uL (ref 0.0–0.7)
Eosinophils Relative: 2.7 % (ref 0.0–5.0)
HEMATOCRIT: 42.7 % (ref 36.0–46.0)
Hemoglobin: 14.2 g/dL (ref 12.0–15.0)
LYMPHS ABS: 2.5 10*3/uL (ref 0.7–4.0)
Lymphocytes Relative: 26.3 % (ref 12.0–46.0)
MCHC: 33.3 g/dL (ref 30.0–36.0)
MCV: 91.1 fl (ref 78.0–100.0)
MONO ABS: 0.7 10*3/uL (ref 0.1–1.0)
Monocytes Relative: 7.6 % (ref 3.0–12.0)
NEUTROS ABS: 5.9 10*3/uL (ref 1.4–7.7)
Neutrophils Relative %: 63.1 % (ref 43.0–77.0)
Platelets: 164 10*3/uL (ref 150.0–400.0)
RBC: 4.68 Mil/uL (ref 3.87–5.11)
RDW: 13.4 % (ref 11.5–15.5)
WBC: 9.4 10*3/uL (ref 4.0–10.5)

## 2014-07-15 LAB — HEMOGLOBIN A1C: Hgb A1c MFr Bld: 7.3 % — ABNORMAL HIGH (ref 4.6–6.5)

## 2014-07-15 LAB — LIPID PANEL
CHOL/HDL RATIO: 4
CHOLESTEROL: 164 mg/dL (ref 0–200)
HDL: 42.5 mg/dL (ref >39.00)
LDL Cholesterol: 92 mg/dL (ref 0–99)
NonHDL: 121.5
TRIGLYCERIDES: 149 mg/dL (ref 0.0–149.0)
VLDL: 29.8 mg/dL (ref 0.0–40.0)

## 2014-07-15 LAB — HEPATIC FUNCTION PANEL
ALT: 21 U/L (ref 0–35)
AST: 24 U/L (ref 0–37)
Albumin: 3.5 g/dL (ref 3.5–5.2)
Alkaline Phosphatase: 96 U/L (ref 39–117)
BILIRUBIN DIRECT: 0.1 mg/dL (ref 0.0–0.3)
TOTAL PROTEIN: 7 g/dL (ref 6.0–8.3)
Total Bilirubin: 0.9 mg/dL (ref 0.2–1.2)

## 2014-07-15 LAB — TSH: TSH: 1.15 u[IU]/mL (ref 0.35–4.50)

## 2014-07-15 LAB — MICROALBUMIN / CREATININE URINE RATIO
Creatinine,U: 144.1 mg/dL
MICROALB/CREAT RATIO: 1.5 mg/g (ref 0.0–30.0)
Microalb, Ur: 2.1 mg/dL — ABNORMAL HIGH (ref 0.0–1.9)

## 2014-07-15 NOTE — Progress Notes (Signed)
Pre visit review using our clinic review tool, if applicable. No additional management support is needed unless otherwise documented below in the visit note. 

## 2014-07-15 NOTE — Patient Instructions (Signed)
Preventive Care for Adults A healthy lifestyle and preventive care can promote health and wellness. Preventive health guidelines for women include the following key practices.  A routine yearly physical is a good way to check with your health care provider about your health and preventive screening. It is a chance to share any concerns and updates on your health and to receive a thorough exam.  Visit your dentist for a routine exam and preventive care every 6 months. Brush your teeth twice a day and floss once a day. Good oral hygiene prevents tooth decay and gum disease.  The frequency of eye exams is based on your age, health, family medical history, use of contact lenses, and other factors. Follow your health care provider's recommendations for frequency of eye exams.  Eat a healthy diet. Foods like vegetables, fruits, whole grains, low-fat dairy products, and lean protein foods contain the nutrients you need without too many calories. Decrease your intake of foods high in solid fats, added sugars, and salt. Eat the right amount of calories for you.Get information about a proper diet from your health care provider, if necessary.  Regular physical exercise is one of the most important things you can do for your health. Most adults should get at least 150 minutes of moderate-intensity exercise (any activity that increases your heart rate and causes you to sweat) each week. In addition, most adults need muscle-strengthening exercises on 2 or more days a week.  Maintain a healthy weight. The body mass index (BMI) is a screening tool to identify possible weight problems. It provides an estimate of body fat based on height and weight. Your health care provider can find your BMI and can help you achieve or maintain a healthy weight.For adults 20 years and older:  A BMI below 18.5 is considered underweight.  A BMI of 18.5 to 24.9 is normal.  A BMI of 25 to 29.9 is considered overweight.  A BMI of  30 and above is considered obese.  Maintain normal blood lipids and cholesterol levels by exercising and minimizing your intake of saturated fat. Eat a balanced diet with plenty of fruit and vegetables. Blood tests for lipids and cholesterol should begin at age 76 and be repeated every 5 years. If your lipid or cholesterol levels are high, you are over 50, or you are at high risk for heart disease, you may need your cholesterol levels checked more frequently.Ongoing high lipid and cholesterol levels should be treated with medicines if diet and exercise are not working.  If you smoke, find out from your health care provider how to quit. If you do not use tobacco, do not start.  Lung cancer screening is recommended for adults aged 22-80 years who are at high risk for developing lung cancer because of a history of smoking. A yearly low-dose CT scan of the lungs is recommended for people who have at least a 30-pack-year history of smoking and are a current smoker or have quit within the past 15 years. A pack year of smoking is smoking an average of 1 pack of cigarettes a day for 1 year (for example: 1 pack a day for 30 years or 2 packs a day for 15 years). Yearly screening should continue until the smoker has stopped smoking for at least 15 years. Yearly screening should be stopped for people who develop a health problem that would prevent them from having lung cancer treatment.  If you are pregnant, do not drink alcohol. If you are breastfeeding,  be very cautious about drinking alcohol. If you are not pregnant and choose to drink alcohol, do not have more than 1 drink per day. One drink is considered to be 12 ounces (355 mL) of beer, 5 ounces (148 mL) of wine, or 1.5 ounces (44 mL) of liquor.  Avoid use of street drugs. Do not share needles with anyone. Ask for help if you need support or instructions about stopping the use of drugs.  High blood pressure causes heart disease and increases the risk of  stroke. Your blood pressure should be checked at least every 1 to 2 years. Ongoing high blood pressure should be treated with medicines if weight loss and exercise do not work.  If you are 3-86 years old, ask your health care provider if you should take aspirin to prevent strokes.  Diabetes screening involves taking a blood sample to check your fasting blood sugar level. This should be done once every 3 years, after age 67, if you are within normal weight and without risk factors for diabetes. Testing should be considered at a younger age or be carried out more frequently if you are overweight and have at least 1 risk factor for diabetes.  Breast cancer screening is essential preventive care for women. You should practice "breast self-awareness." This means understanding the normal appearance and feel of your breasts and may include breast self-examination. Any changes detected, no matter how small, should be reported to a health care provider. Women in their 8s and 30s should have a clinical breast exam (CBE) by a health care provider as part of a regular health exam every 1 to 3 years. After age 70, women should have a CBE every year. Starting at age 25, women should consider having a mammogram (breast X-ray test) every year. Women who have a family history of breast cancer should talk to their health care provider about genetic screening. Women at a high risk of breast cancer should talk to their health care providers about having an MRI and a mammogram every year.  Breast cancer gene (BRCA)-related cancer risk assessment is recommended for women who have family members with BRCA-related cancers. BRCA-related cancers include breast, ovarian, tubal, and peritoneal cancers. Having family members with these cancers may be associated with an increased risk for harmful changes (mutations) in the breast cancer genes BRCA1 and BRCA2. Results of the assessment will determine the need for genetic counseling and  BRCA1 and BRCA2 testing.  Routine pelvic exams to screen for cancer are no longer recommended for nonpregnant women who are considered low risk for cancer of the pelvic organs (ovaries, uterus, and vagina) and who do not have symptoms. Ask your health care provider if a screening pelvic exam is right for you.  If you have had past treatment for cervical cancer or a condition that could lead to cancer, you need Pap tests and screening for cancer for at least 20 years after your treatment. If Pap tests have been discontinued, your risk factors (such as having a new sexual partner) need to be reassessed to determine if screening should be resumed. Some women have medical problems that increase the chance of getting cervical cancer. In these cases, your health care provider may recommend more frequent screening and Pap tests.  The HPV test is an additional test that may be used for cervical cancer screening. The HPV test looks for the virus that can cause the cell changes on the cervix. The cells collected during the Pap test can be  tested for HPV. The HPV test could be used to screen women aged 30 years and older, and should be used in women of any age who have unclear Pap test results. After the age of 30, women should have HPV testing at the same frequency as a Pap test.  Colorectal cancer can be detected and often prevented. Most routine colorectal cancer screening begins at the age of 50 years and continues through age 75 years. However, your health care provider may recommend screening at an earlier age if you have risk factors for colon cancer. On a yearly basis, your health care provider may provide home test kits to check for hidden blood in the stool. Use of a small camera at the end of a tube, to directly examine the colon (sigmoidoscopy or colonoscopy), can detect the earliest forms of colorectal cancer. Talk to your health care provider about this at age 50, when routine screening begins. Direct  exam of the colon should be repeated every 5-10 years through age 75 years, unless early forms of pre-cancerous polyps or small growths are found.  People who are at an increased risk for hepatitis B should be screened for this virus. You are considered at high risk for hepatitis B if:  You were born in a country where hepatitis B occurs often. Talk with your health care provider about which countries are considered high risk.  Your parents were born in a high-risk country and you have not received a shot to protect against hepatitis B (hepatitis B vaccine).  You have HIV or AIDS.  You use needles to inject street drugs.  You live with, or have sex with, someone who has hepatitis B.  You get hemodialysis treatment.  You take certain medicines for conditions like cancer, organ transplantation, and autoimmune conditions.  Hepatitis C blood testing is recommended for all people born from 1945 through 1965 and any individual with known risks for hepatitis C.  Practice safe sex. Use condoms and avoid high-risk sexual practices to reduce the spread of sexually transmitted infections (STIs). STIs include gonorrhea, chlamydia, syphilis, trichomonas, herpes, HPV, and human immunodeficiency virus (HIV). Herpes, HIV, and HPV are viral illnesses that have no cure. They can result in disability, cancer, and death.  You should be screened for sexually transmitted illnesses (STIs) including gonorrhea and chlamydia if:  You are sexually active and are younger than 24 years.  You are older than 24 years and your health care provider tells you that you are at risk for this type of infection.  Your sexual activity has changed since you were last screened and you are at an increased risk for chlamydia or gonorrhea. Ask your health care provider if you are at risk.  If you are at risk of being infected with HIV, it is recommended that you take a prescription medicine daily to prevent HIV infection. This is  called preexposure prophylaxis (PrEP). You are considered at risk if:  You are a heterosexual woman, are sexually active, and are at increased risk for HIV infection.  You take drugs by injection.  You are sexually active with a partner who has HIV.  Talk with your health care provider about whether you are at high risk of being infected with HIV. If you choose to begin PrEP, you should first be tested for HIV. You should then be tested every 3 months for as long as you are taking PrEP.  Osteoporosis is a disease in which the bones lose minerals and strength   with aging. This can result in serious bone fractures or breaks. The risk of osteoporosis can be identified using a bone density scan. Women ages 65 years and over and women at risk for fractures or osteoporosis should discuss screening with their health care providers. Ask your health care provider whether you should take a calcium supplement or vitamin D to reduce the rate of osteoporosis.  Menopause can be associated with physical symptoms and risks. Hormone replacement therapy is available to decrease symptoms and risks. You should talk to your health care provider about whether hormone replacement therapy is right for you.  Use sunscreen. Apply sunscreen liberally and repeatedly throughout the day. You should seek shade when your shadow is shorter than you. Protect yourself by wearing long sleeves, pants, a wide-brimmed hat, and sunglasses year round, whenever you are outdoors.  Once a month, do a whole body skin exam, using a mirror to look at the skin on your back. Tell your health care provider of new moles, moles that have irregular borders, moles that are larger than a pencil eraser, or moles that have changed in shape or color.  Stay current with required vaccines (immunizations).  Influenza vaccine. All adults should be immunized every year.  Tetanus, diphtheria, and acellular pertussis (Td, Tdap) vaccine. Pregnant women should  receive 1 dose of Tdap vaccine during each pregnancy. The dose should be obtained regardless of the length of time since the last dose. Immunization is preferred during the 27th-36th week of gestation. An adult who has not previously received Tdap or who does not know her vaccine status should receive 1 dose of Tdap. This initial dose should be followed by tetanus and diphtheria toxoids (Td) booster doses every 10 years. Adults with an unknown or incomplete history of completing a 3-dose immunization series with Td-containing vaccines should begin or complete a primary immunization series including a Tdap dose. Adults should receive a Td booster every 10 years.  Varicella vaccine. An adult without evidence of immunity to varicella should receive 2 doses or a second dose if she has previously received 1 dose. Pregnant females who do not have evidence of immunity should receive the first dose after pregnancy. This first dose should be obtained before leaving the health care facility. The second dose should be obtained 4-8 weeks after the first dose.  Human papillomavirus (HPV) vaccine. Females aged 13-26 years who have not received the vaccine previously should obtain the 3-dose series. The vaccine is not recommended for use in pregnant females. However, pregnancy testing is not needed before receiving a dose. If a female is found to be pregnant after receiving a dose, no treatment is needed. In that case, the remaining doses should be delayed until after the pregnancy. Immunization is recommended for any person with an immunocompromised condition through the age of 26 years if she did not get any or all doses earlier. During the 3-dose series, the second dose should be obtained 4-8 weeks after the first dose. The third dose should be obtained 24 weeks after the first dose and 16 weeks after the second dose.  Zoster vaccine. One dose is recommended for adults aged 60 years or older unless certain conditions are  present.  Measles, mumps, and rubella (MMR) vaccine. Adults born before 1957 generally are considered immune to measles and mumps. Adults born in 1957 or later should have 1 or more doses of MMR vaccine unless there is a contraindication to the vaccine or there is laboratory evidence of immunity to   each of the three diseases. A routine second dose of MMR vaccine should be obtained at least 28 days after the first dose for students attending postsecondary schools, health care workers, or international travelers. People who received inactivated measles vaccine or an unknown type of measles vaccine during 1963-1967 should receive 2 doses of MMR vaccine. People who received inactivated mumps vaccine or an unknown type of mumps vaccine before 1979 and are at high risk for mumps infection should consider immunization with 2 doses of MMR vaccine. For females of childbearing age, rubella immunity should be determined. If there is no evidence of immunity, females who are not pregnant should be vaccinated. If there is no evidence of immunity, females who are pregnant should delay immunization until after pregnancy. Unvaccinated health care workers born before 1957 who lack laboratory evidence of measles, mumps, or rubella immunity or laboratory confirmation of disease should consider measles and mumps immunization with 2 doses of MMR vaccine or rubella immunization with 1 dose of MMR vaccine.  Pneumococcal 13-valent conjugate (PCV13) vaccine. When indicated, a person who is uncertain of her immunization history and has no record of immunization should receive the PCV13 vaccine. An adult aged 19 years or older who has certain medical conditions and has not been previously immunized should receive 1 dose of PCV13 vaccine. This PCV13 should be followed with a dose of pneumococcal polysaccharide (PPSV23) vaccine. The PPSV23 vaccine dose should be obtained at least 8 weeks after the dose of PCV13 vaccine. An adult aged 19  years or older who has certain medical conditions and previously received 1 or more doses of PPSV23 vaccine should receive 1 dose of PCV13. The PCV13 vaccine dose should be obtained 1 or more years after the last PPSV23 vaccine dose.  Pneumococcal polysaccharide (PPSV23) vaccine. When PCV13 is also indicated, PCV13 should be obtained first. All adults aged 65 years and older should be immunized. An adult younger than age 65 years who has certain medical conditions should be immunized. Any person who resides in a nursing home or long-term care facility should be immunized. An adult smoker should be immunized. People with an immunocompromised condition and certain other conditions should receive both PCV13 and PPSV23 vaccines. People with human immunodeficiency virus (HIV) infection should be immunized as soon as possible after diagnosis. Immunization during chemotherapy or radiation therapy should be avoided. Routine use of PPSV23 vaccine is not recommended for American Indians, [place] Natives, or people younger than 65 years unless there are medical conditions that require PPSV23 vaccine. When indicated, people who have unknown immunization and have no record of immunization should receive PPSV23 vaccine. One-time revaccination 5 years after the first dose of PPSV23 is recommended for people aged 19-64 years who have chronic kidney failure, nephrotic syndrome, asplenia, or immunocompromised conditions. People who received 1-2 doses of PPSV23 before age 65 years should receive another dose of PPSV23 vaccine at age 65 years or later if at least 5 years have passed since the previous dose. Doses of PPSV23 are not needed for people immunized with PPSV23 at or after age 65 years.  Meningococcal vaccine. Adults with asplenia or persistent complement component deficiencies should receive 2 doses of quadrivalent meningococcal conjugate (MenACWY-D) vaccine. The doses should be obtained at least 2 months apart.  Microbiologists working with certain meningococcal bacteria, military recruits, people at risk during an outbreak, and people who travel to or live in countries with a high rate of meningitis should be immunized. A first-year college student up through age   21 years who is living in a residence hall should receive a dose if she did not receive a dose on or after her 16th birthday. Adults who have certain high-risk conditions should receive one or more doses of vaccine.  Hepatitis A vaccine. Adults who wish to be protected from this disease, have certain high-risk conditions, work with hepatitis A-infected animals, work in hepatitis A research labs, or travel to or work in countries with a high rate of hepatitis A should be immunized. Adults who were previously unvaccinated and who anticipate close contact with an international adoptee during the first 60 days after arrival in the [place] from a country with a high rate of hepatitis A should be immunized.  Hepatitis B vaccine. Adults who wish to be protected from this disease, have certain high-risk conditions, may be exposed to blood or other infectious body fluids, are household contacts or sex partners of hepatitis B positive people, are clients or workers in certain care facilities, or travel to or work in countries with a high rate of hepatitis B should be immunized.  Haemophilus influenzae type b (Hib) vaccine. A previously unvaccinated person with asplenia or sickle cell disease or having a scheduled splenectomy should receive 1 dose of Hib vaccine. Regardless of previous immunization, a recipient of a hematopoietic stem cell transplant should receive a 3-dose series 6-12 months after her successful transplant. Hib vaccine is not recommended for adults with HIV infection. Preventive Services / Frequency Ages 64 to 68 years  Blood pressure check.** / Every 1 to 2 years.  Lipid and cholesterol check.** / Every 5 years beginning at age  22.  Clinical breast exam.** / Every 3 years for women in their 88s and 53s.  BRCA-related cancer risk assessment.** / For women who have family members with a BRCA-related cancer (breast, ovarian, tubal, or peritoneal cancers).  Pap test.** / Every 2 years from ages 90 through 51. Every 3 years starting at age 21 through age 56 or 3 with a history of 3 consecutive normal Pap tests.  HPV screening.** / Every 3 years from ages 24 through ages 1 to 46 with a history of 3 consecutive normal Pap tests.  Hepatitis C blood test.** / For any individual with known risks for hepatitis C.  Skin self-exam. / Monthly.  Influenza vaccine. / Every year.  Tetanus, diphtheria, and acellular pertussis (Tdap, Td) vaccine.** / Consult your health care provider. Pregnant women should receive 1 dose of Tdap vaccine during each pregnancy. 1 dose of Td every 10 years.  Varicella vaccine.** / Consult your health care provider. Pregnant females who do not have evidence of immunity should receive the first dose after pregnancy.  HPV vaccine. / 3 doses over 6 months, if 72 and younger. The vaccine is not recommended for use in pregnant females. However, pregnancy testing is not needed before receiving a dose.  Measles, mumps, rubella (MMR) vaccine.** / You need at least 1 dose of MMR if you were born in 1957 or later. You may also need a 2nd dose. For females of childbearing age, rubella immunity should be determined. If there is no evidence of immunity, females who are not pregnant should be vaccinated. If there is no evidence of immunity, females who are pregnant should delay immunization until after pregnancy.  Pneumococcal 13-valent conjugate (PCV13) vaccine.** / Consult your health care provider.  Pneumococcal polysaccharide (PPSV23) vaccine.** / 1 to 2 doses if you smoke cigarettes or if you have certain conditions.  Meningococcal vaccine.** /  1 dose if you are age 19 to 21 years and a first-year college  student living in a residence hall, or have one of several medical conditions, you need to get vaccinated against meningococcal disease. You may also need additional booster doses.  Hepatitis A vaccine.** / Consult your health care provider.  Hepatitis B vaccine.** / Consult your health care provider.  Haemophilus influenzae type b (Hib) vaccine.** / Consult your health care provider. Ages 40 to 64 years  Blood pressure check.** / Every 1 to 2 years.  Lipid and cholesterol check.** / Every 5 years beginning at age 20 years.  Lung cancer screening. / Every year if you are aged 55-80 years and have a 30-pack-year history of smoking and currently smoke or have quit within the past 15 years. Yearly screening is stopped once you have quit smoking for at least 15 years or develop a health problem that would prevent you from having lung cancer treatment.  Clinical breast exam.** / Every year after age 40 years.  BRCA-related cancer risk assessment.** / For women who have family members with a BRCA-related cancer (breast, ovarian, tubal, or peritoneal cancers).  Mammogram.** / Every year beginning at age 40 years and continuing for as long as you are in good health. Consult with your health care provider.  Pap test.** / Every 3 years starting at age 30 years through age 65 or 70 years with a history of 3 consecutive normal Pap tests.  HPV screening.** / Every 3 years from ages 30 years through ages 65 to 70 years with a history of 3 consecutive normal Pap tests.  Fecal occult blood test (FOBT) of stool. / Every year beginning at age 50 years and continuing until age 75 years. You may not need to do this test if you get a colonoscopy every 10 years.  Flexible sigmoidoscopy or colonoscopy.** / Every 5 years for a flexible sigmoidoscopy or every 10 years for a colonoscopy beginning at age 50 years and continuing until age 75 years.  Hepatitis C blood test.** / For all people born from 1945 through  1965 and any individual with known risks for hepatitis C.  Skin self-exam. / Monthly.  Influenza vaccine. / Every year.  Tetanus, diphtheria, and acellular pertussis (Tdap/Td) vaccine.** / Consult your health care provider. Pregnant women should receive 1 dose of Tdap vaccine during each pregnancy. 1 dose of Td every 10 years.  Varicella vaccine.** / Consult your health care provider. Pregnant females who do not have evidence of immunity should receive the first dose after pregnancy.  Zoster vaccine.** / 1 dose for adults aged 60 years or older.  Measles, mumps, rubella (MMR) vaccine.** / You need at least 1 dose of MMR if you were born in 1957 or later. You may also need a 2nd dose. For females of childbearing age, rubella immunity should be determined. If there is no evidence of immunity, females who are not pregnant should be vaccinated. If there is no evidence of immunity, females who are pregnant should delay immunization until after pregnancy.  Pneumococcal 13-valent conjugate (PCV13) vaccine.** / Consult your health care provider.  Pneumococcal polysaccharide (PPSV23) vaccine.** / 1 to 2 doses if you smoke cigarettes or if you have certain conditions.  Meningococcal vaccine.** / Consult your health care provider.  Hepatitis A vaccine.** / Consult your health care provider.  Hepatitis B vaccine.** / Consult your health care provider.  Haemophilus influenzae type b (Hib) vaccine.** / Consult your health care provider. Ages 65   years and over  Blood pressure check.** / Every 1 to 2 years.  Lipid and cholesterol check.** / Every 5 years beginning at age 22 years.  Lung cancer screening. / Every year if you are aged 73-80 years and have a 30-pack-year history of smoking and currently smoke or have quit within the past 15 years. Yearly screening is stopped once you have quit smoking for at least 15 years or develop a health problem that would prevent you from having lung cancer  treatment.  Clinical breast exam.** / Every year after age 4 years.  BRCA-related cancer risk assessment.** / For women who have family members with a BRCA-related cancer (breast, ovarian, tubal, or peritoneal cancers).  Mammogram.** / Every year beginning at age 40 years and continuing for as long as you are in good health. Consult with your health care provider.  Pap test.** / Every 3 years starting at age 9 years through age 34 or 91 years with 3 consecutive normal Pap tests. Testing can be stopped between 65 and 70 years with 3 consecutive normal Pap tests and no abnormal Pap or HPV tests in the past 10 years.  HPV screening.** / Every 3 years from ages 57 years through ages 64 or 45 years with a history of 3 consecutive normal Pap tests. Testing can be stopped between 65 and 70 years with 3 consecutive normal Pap tests and no abnormal Pap or HPV tests in the past 10 years.  Fecal occult blood test (FOBT) of stool. / Every year beginning at age 15 years and continuing until age 17 years. You may not need to do this test if you get a colonoscopy every 10 years.  Flexible sigmoidoscopy or colonoscopy.** / Every 5 years for a flexible sigmoidoscopy or every 10 years for a colonoscopy beginning at age 86 years and continuing until age 71 years.  Hepatitis C blood test.** / For all people born from [birth year] through 1963 and any individual with known risks for hepatitis C.  Osteoporosis screening.** / A one-time screening for women ages 83 years and over and women at risk for fractures or osteoporosis.  Skin self-exam. / Monthly.  Influenza vaccine. / Every year.  Tetanus, diphtheria, and acellular pertussis (Tdap/Td) vaccine.** / 1 dose of Td every 10 years.  Varicella vaccine.** / Consult your health care provider.  Zoster vaccine.** / 1 dose for adults aged 61 years or older.  Pneumococcal 13-valent conjugate (PCV13) vaccine.** / Consult your health care provider.  Pneumococcal  polysaccharide (PPSV23) vaccine.** / 1 dose for all adults aged 28 years and older.  Meningococcal vaccine.** / Consult your health care provider.  Hepatitis A vaccine.** / Consult your health care provider.  Hepatitis B vaccine.** / Consult your health care provider.  Haemophilus influenzae type b (Hib) vaccine.** / Consult your health care provider. ** Family history and personal history of risk and conditions may change your health care provider's recommendations. Document Released: 12/17/2001 Document Revised: 03/07/2014 Document Reviewed: 03/18/2011 [facility] Patient Information 2015 [place], [place]. This information is not intended to replace advice given to you by your health care provider. Make sure you discuss any questions you have with your health care provider.

## 2014-07-15 NOTE — Progress Notes (Signed)
Subjective:     Jacqueline Duran is a 63 y.o. female and is here for a comprehensive physical exam. The patient reports no problems.  History   Social History  . Marital Status: Married    Spouse Name: N/A    Number of Children: N/A  . Years of Education: N/A   Occupational History  . unemployed    Social History Main Topics  . Smoking status: Never Smoker   . Smokeless tobacco: Never Used  . Alcohol Use: No  . Drug Use: No  . Sexual Activity: Not Currently    Partners: Male   Other Topics Concern  . Not on file   Social History Narrative   Husband dx with dementia 12/2011   Health Maintenance  Topic Date Due  . Zostavax  08/21/2013  . Ophthalmology Exam  03/17/2014  . Influenza Vaccine  06/04/2014  . Foot Exam  07/08/2014  . Hemoglobin A1c  07/14/2014  . Pap Smear  07/16/2015 (Originally 05/19/2014)  . Urine Microalbumin  01/12/2015  . Mammogram  07/06/2016  . Pneumococcal Polysaccharide Vaccine (##2) 06/04/2017  . Tetanus/tdap  05/17/2019  . Colonoscopy  05/19/2020    The following portions of the patient's history were reviewed and updated as appropriate:  She  has a past medical history of Hypertension; Hyperlipidemia; Deafness; Diabetes mellitus; and Colon polyps. She  does not have any pertinent problems on file. She  has past surgical history that includes Abdominal hysterectomy; Tonsillectomy; Cervical fusion (07/2004); and Colonoscopy (8/11). Her family history includes Alcohol abuse in her other and another family member; Cancer in her father; Clotting disorder in her sister; Coronary artery disease in her mother; Factor V Leiden deficiency in her other; Heart attack in an other family member; Heart disease in her other; Leukemia in her maternal uncle; Lung cancer in her father; Stroke in an other family member. She  reports that she has never smoked. She has never used smokeless tobacco. She reports that she does not drink alcohol or use illicit drugs. She  has a current medication list which includes the following prescription(s): albuterol, ascorbic acid, aspirin, vitamin d, escitalopram, fish oil-omega-3 fatty acids, fluticasone, folic acid, lipitor, multivitamin, omeprazole, one touch lancets, onetouch delica lancets, potassium chloride, saxagliptin-metformin, and valsartan-hydrochlorothiazide. Current Outpatient Prescriptions on File Prior to Visit  Medication Sig Dispense Refill  . albuterol (PROAIR HFA) 108 (90 BASE) MCG/ACT inhaler Inhale 2 puffs into the lungs every 6 (six) hours as needed for wheezing.  1 Inhaler  2  . ascorbic acid (VITAMIN C) 500 MG tablet Take 500 mg by mouth daily.        Marland Kitchen aspirin 81 MG tablet Take 81 mg by mouth daily.        . Cholecalciferol (VITAMIN D) 400 UNITS capsule Take 1,200 Units by mouth daily.        Marland Kitchen escitalopram (LEXAPRO) 20 MG tablet Take 1 tablet (20 mg total) by mouth daily.  90 tablet  1  . fish oil-omega-3 fatty acids 1000 MG capsule Take 1 g by mouth daily.        . fluticasone (FLONASE) 50 MCG/ACT nasal spray Place 2 sprays into the nose daily.  16 g  2  . folic acid (FOLVITE) 1 MG tablet Take 1 tablet (1 mg total) by mouth daily.  90 tablet  1  . LIPITOR 10 MG tablet Take 1 tablet (10 mg total) by mouth daily.  30 tablet  1  . Multiple Vitamin (MULTIVITAMIN) tablet Take  1 tablet by mouth daily.        Marland Kitchen omeprazole (PRILOSEC OTC) 20 MG tablet Take 20 mg by mouth daily.        . ONE TOUCH LANCETS MISC qd  200 each  1  . ONETOUCH DELICA LANCETS MISC 1 Device by Does not apply route 2 (two) times daily.        . potassium chloride (K-DUR) 10 MEQ tablet Take 2 tablets (20 mEq total) by mouth 2 (two) times daily.  180 tablet  3  . Saxagliptin-Metformin 2.03-999 MG TB24 Take 2 tablets by mouth daily.  180 tablet  3  . valsartan-hydrochlorothiazide (DIOVAN HCT) 160-25 MG per tablet Take 1 tablet by mouth daily.  90 tablet  1   No current facility-administered medications on file prior to visit.   She  is allergic to amlodipine besylate..  Review of Systems Review of Systems  Constitutional: Negative for activity change, appetite change and fatigue.  HENT: Negative for hearing loss, congestion, tinnitus and ear discharge.  dentist -due Eyes: Negative for visual disturbance (see optho--due) Respiratory: Negative for cough, chest tightness and shortness of breath.   Cardiovascular: Negative for chest pain, palpitations and leg swelling.  Gastrointestinal: Negative for abdominal pain, diarrhea, constipation and abdominal distention.  Genitourinary: Negative for urgency, frequency, decreased urine volume and difficulty urinating.  Musculoskeletal: Negative for back pain, arthralgias and gait problem.  Skin: Negative for color change, pallor and rash.  Neurological: Negative for dizziness, light-headedness, numbness and headaches.  Hematological: Negative for adenopathy. Does not bruise/bleed easily.  Psychiatric/Behavioral: Negative for suicidal ideas, confusion, sleep disturbance, self-injury, dysphoric mood, decreased concentration and agitation.       Objective:    BP 119/69  Pulse 66  Temp(Src) 99 F (37.2 C) (Oral)  Ht 5\' 4"  (1.626 m)  Wt 269 lb 13.5 oz (122.4 kg)  BMI 46.30 kg/m2  SpO2 95% General appearance: alert, cooperative, appears stated age and no distress Head: Normocephalic, without obvious abnormality, atraumatic Eyes: conjunctivae/corneas clear. PERRL, EOM's intact. Fundi benign. Ears: normal TM's and external ear canals both ears Nose: Nares normal. Septum midline. Mucosa normal. No drainage or sinus tenderness. Throat: lips, mucosa, and tongue normal; teeth and gums normal Neck: no adenopathy, no carotid bruit, no JVD, supple, symmetrical, trachea midline and thyroid not enlarged, symmetric, no tenderness/mass/nodules Back: symmetric, no curvature. ROM normal. No CVA tenderness. Lungs: clear to auscultation bilaterally Breasts: normal appearance, no masses or  tenderness Heart: regular rate and rhythm, S1, S2 normal, no murmur, click, rub or gallop Abdomen: soft, non-tender; bowel sounds normal; no masses,  no organomegaly Pelvic: not indicated; status post hysterectomy, negative ROS Extremities: extremities normal, atraumatic, no cyanosis or edema Pulses: 2+ and symmetric Skin: Skin color, texture, turgor normal. No rashes or lesions Lymph nodes: Cervical, supraclavicular, and axillary nodes normal. Neurologic: Alert and oriented X 3, normal strength and tone. Normal symmetric reflexes. Normal coordination and gait Psych--no depression, no anxiety     feet--pt unable to feel monofilament Assessment:    Healthy female exam.      Plan:     ghm utd Check labs See After Visit Summary for Counseling Recommendations   1. Type II or unspecified type diabetes mellitus with peripheral circulatory disorders, uncontrolled(250.72) Check labs , con't meds-- pt due for oph exam - Basic metabolic panel - CBC with Differential - Hemoglobin A1c - Hepatic function panel - Lipid panel - Microalbumin / creatinine urine ratio - POCT urinalysis dipstick - TSH  2.  Essential hypertension Stable, con't meds - Basic metabolic panel - CBC with Differential - Hemoglobin A1c - Hepatic function panel - Lipid panel - Microalbumin / creatinine urine ratio - POCT urinalysis dipstick - TSH  3. Other and unspecified hyperlipidemia Check labs today - Basic metabolic panel - CBC with Differential - Hemoglobin A1c - Hepatic function panel - Lipid panel - Microalbumin / creatinine urine ratio - POCT urinalysis dipstick - TSH  4. Preventative health care  - Basic metabolic panel - CBC with Differential - Hemoglobin A1c - Hepatic function panel - Lipid panel - Microalbumin / creatinine urine ratio - POCT urinalysis dipstick - TSH  5. Need for shingles vaccine   - Varicella-zoster vaccine subcutaneous  6. Need for prophylactic vaccination and  inoculation against influenza   - Flu Vaccine QUAD 36+ mos PF IM (Fluarix Quad PF)

## 2014-07-21 ENCOUNTER — Telehealth: Payer: Self-pay

## 2014-07-21 MED ORDER — DAPAGLIFLOZIN PROPANEDIOL 5 MG PO TABS: 5.0000 mg | ORAL_TABLET | Freq: Every day | ORAL | Status: DC

## 2014-07-21 NOTE — Telephone Encounter (Signed)
Notes Recorded by Rosalita Chessman, DO on 07/20/2014 at 9:48 PM LDL goal for diabetic is 70 Try 1/2 of 2 mg amaryl --- otherwise try --- farxiga 5 mg 1 po qam #30 2 refiills   Notes Recorded by Ewing Schlein, CMA on 07/20/2014 at 10:15 AM Spoke with patient and the Amaryl makes her sleepy and she can not take it, Her cholesterol was also WNL so are you wanting to increase the Lipitor or was that an error ? KP Notes Recorded by Rosalita Chessman, DO on 07/19/2014 at 12:52 PM DM-- hgba1c stable-- goal < 6.5 --- Add amaryl 1 mg 1 po qd , 2 refills Cholesterol -- Cholesterol--- LDL goal < 70, HDL >40, TG < 150. Diet and exercise will increase HDL and decrease LDL and TG. Fish, Fish Oil, Flaxseed oil will also help increase the HDL and decrease Triglycerides. Recheck labs in 3 months----inc lipitor to 20 mg #30 1 po qhs, 2 refills Lipid, hep, bmp, hgba1 272.4 250.01.

## 2014-07-21 NOTE — Telephone Encounter (Signed)
The patient is ok with starting [place]. Rx faxed and copy of labs mailed to the home address.      KP

## 2014-07-21 NOTE — Telephone Encounter (Signed)
Message copied by Ewing Schlein on Thu Jul 21, 2014  3:39 PM ------      Message from: Rosalita Chessman      Created: Wed Jul 20, 2014  9:48 PM       LDL goal for diabetic is 13      Try 1/2 of 2 mg amaryl --- otherwise try --- farxiga 5 mg 1 po qam  #30  2 refiills             ------

## 2014-12-19 ENCOUNTER — Other Ambulatory Visit: Payer: Self-pay

## 2014-12-19 DIAGNOSIS — F329 Major depressive disorder, single episode, unspecified: Secondary | ICD-10-CM

## 2014-12-19 DIAGNOSIS — F32A Depression, unspecified: Secondary | ICD-10-CM

## 2014-12-19 MED ORDER — ESCITALOPRAM OXALATE 20 MG PO TABS: 20.0000 mg | ORAL_TABLET | Freq: Every day | ORAL | Status: DC

## 2015-02-20 ENCOUNTER — Other Ambulatory Visit: Payer: Self-pay | Admitting: Family Medicine

## 2015-02-21 ENCOUNTER — Telehealth: Payer: Self-pay | Admitting: Family Medicine

## 2015-02-21 DIAGNOSIS — F32A Depression, unspecified: Secondary | ICD-10-CM

## 2015-02-21 DIAGNOSIS — E7219 Other disorders of sulfur-bearing amino-acid metabolism: Secondary | ICD-10-CM

## 2015-02-21 DIAGNOSIS — F329 Major depressive disorder, single episode, unspecified: Secondary | ICD-10-CM

## 2015-02-21 DIAGNOSIS — E119 Type 2 diabetes mellitus without complications: Secondary | ICD-10-CM

## 2015-02-21 DIAGNOSIS — R7983 Abnormal findings of blood amino-acid level: Secondary | ICD-10-CM

## 2015-02-21 NOTE — Telephone Encounter (Signed)
Msg left advising to call back with the names of med's or call the pharmacy to have request sent electronically.      KP

## 2015-02-21 NOTE — Telephone Encounter (Signed)
Caller name: Melesa Relation to pt: self Call back number: [phone] Pharmacy: [facility] on [address]  Reason for call:   Patient states that she had an appointment this week for med refill. She started a new job and her husband has been taken to hospital today and would like to know if dr Etter Sjogren would refill diabetes med and bp med, lexapro.. She states that she will callback and reschedule as soon as possible.

## 2015-02-22 NOTE — Telephone Encounter (Addendum)
Relation to pt: self  Call back number: [phone] Pharmacy: [id number] - [place], [place] [phone] (Phone) [phone] (Fax)       Reason for call:  Pt called regarding the list of medication  kombiglyze xr 225/1000 Escitalopram 20MG  folic acid 1mg  valsartan-hctz 160-25 mg tab (pt has 4 refills) but wanted to make you aware  Pt would like to know if MD wants her to come in for lab work or follow up appointment. Please advise

## 2015-02-23 ENCOUNTER — Ambulatory Visit: Payer: Self-pay | Admitting: Family Medicine

## 2015-02-23 MED ORDER — SAXAGLIPTIN-METFORMIN ER 2.5-1000 MG PO TB24
2.0000 | ORAL_TABLET | Freq: Every day | ORAL | Status: DC
Start: 2015-02-23 — End: 2015-07-01

## 2015-02-23 MED ORDER — VALSARTAN-HYDROCHLOROTHIAZIDE 160-25 MG PO TABS: 1.0000 | ORAL_TABLET | Freq: Every day | ORAL | Status: DC

## 2015-02-23 MED ORDER — FOLIC ACID 1 MG PO TABS: 1.0000 mg | ORAL_TABLET | Freq: Every day | ORAL | Status: DC

## 2015-02-23 MED ORDER — ESCITALOPRAM OXALATE 20 MG PO TABS: 20.0000 mg | ORAL_TABLET | Freq: Every day | ORAL | Status: DC

## 2015-02-23 NOTE — Addendum Note (Signed)
Addended by: Ewing Schlein on: 02/23/2015 09:35 AM   Modules accepted: Orders

## 2015-02-23 NOTE — Telephone Encounter (Signed)
She needs a visit and will need to be fasting.     KP

## 2015-02-23 NOTE — Telephone Encounter (Signed)
Pt scheduled follow up fasting labs for 02/27/2015.

## 2015-02-27 ENCOUNTER — Encounter: Payer: Self-pay | Admitting: Family Medicine

## 2015-02-27 ENCOUNTER — Telehealth: Payer: Self-pay | Admitting: *Deleted

## 2015-02-27 ENCOUNTER — Ambulatory Visit (INDEPENDENT_AMBULATORY_CARE_PROVIDER_SITE_OTHER): Payer: 59 | Admitting: Family Medicine

## 2015-02-27 VITALS — BP 120/82 | HR 63 | Temp 98.6°F | Wt 273.8 lb

## 2015-02-27 DIAGNOSIS — E785 Hyperlipidemia, unspecified: Secondary | ICD-10-CM

## 2015-02-27 DIAGNOSIS — I10 Essential (primary) hypertension: Secondary | ICD-10-CM

## 2015-02-27 DIAGNOSIS — E119 Type 2 diabetes mellitus without complications: Secondary | ICD-10-CM | POA: Diagnosis not present

## 2015-02-27 DIAGNOSIS — E876 Hypokalemia: Secondary | ICD-10-CM

## 2015-02-27 LAB — LIPID PANEL
CHOLESTEROL: 151 mg/dL (ref 0–200)
HDL: 44.7 mg/dL (ref >39.00)
LDL Cholesterol: 79 mg/dL (ref 0–99)
NONHDL: 106.3
Total CHOL/HDL Ratio: 3
Triglycerides: 139 mg/dL (ref 0.0–149.0)
VLDL: 27.8 mg/dL (ref 0.0–40.0)

## 2015-02-27 LAB — BASIC METABOLIC PANEL
BUN: 10 mg/dL (ref 6–23)
CO2: 33 mEq/L — ABNORMAL HIGH (ref 19–32)
Calcium: 9.1 mg/dL (ref 8.4–10.5)
Chloride: 103 mEq/L (ref 96–112)
Creatinine, Ser: 0.6 mg/dL (ref 0.40–1.20)
GFR: 107.83 mL/min (ref >60.00)
GLUCOSE: 138 mg/dL — AB (ref 70–99)
POTASSIUM: 4 meq/L (ref 3.5–5.1)
Sodium: 138 mEq/L (ref 135–145)

## 2015-02-27 LAB — HEMOGLOBIN A1C: Hgb A1c MFr Bld: 7.2 % — ABNORMAL HIGH (ref 4.6–6.5)

## 2015-02-27 LAB — HEPATIC FUNCTION PANEL
ALT: 25 U/L (ref 0–35)
AST: 24 U/L (ref 0–37)
Albumin: 3.8 g/dL (ref 3.5–5.2)
Alkaline Phosphatase: 114 U/L (ref 39–117)
BILIRUBIN TOTAL: 0.6 mg/dL (ref 0.2–1.2)
Bilirubin, Direct: 0.1 mg/dL (ref 0.0–0.3)
Total Protein: 7 g/dL (ref 6.0–8.3)

## 2015-02-27 MED ORDER — POTASSIUM CHLORIDE ER 10 MEQ PO TBCR: 20.0000 meq | EXTENDED_RELEASE_TABLET | Freq: Two times a day (BID) | ORAL | Status: DC

## 2015-02-27 MED ORDER — DAPAGLIFLOZIN PROPANEDIOL 5 MG PO TABS: 5.0000 mg | ORAL_TABLET | Freq: Every day | ORAL | Status: DC

## 2015-02-27 MED ORDER — CANAGLIFLOZIN 100 MG PO TABS: 100.0000 mg | ORAL_TABLET | Freq: Every day | ORAL | Status: DC

## 2015-02-27 MED ORDER — LIPITOR 10 MG PO TABS: 10.0000 mg | ORAL_TABLET | Freq: Every day | ORAL | Status: DC

## 2015-02-27 NOTE — Telephone Encounter (Signed)
PA approved effective through 02/27/2016. JG//CMA

## 2015-02-27 NOTE — Progress Notes (Signed)
Patient ID: Jacqueline Duran, female    DOB: June 30, 1951  Age: 64 y.o. MRN: [id number]    Subjective:  Subjective HPI Jacqueline Duran presents for f/u htn, dm and cholesterol.   HPI HYPERTENSION  Blood pressure range-not checking  Chest pain- no      Dyspnea- no Lightheadedness- no   Edema- no Other side effects - no   Medication compliance: good Low salt diet- no  DIABETES  Blood Sugar ranges-not checking  Polyuria- no New Visual problems- no Hypoglycemic symptoms- no Other side effects-no Medication compliance - poor secondary to ins Last eye exam- last year-- due now Foot exam- today  HYPERLIPIDEMIA  Medication compliance- poor ---secondary to ins RUQ pain- no  Muscle aches- no Other side effects-no   Review of Systems  Constitutional: Negative for diaphoresis, appetite change, fatigue and unexpected weight change.  Eyes: Negative for pain, redness and visual disturbance.  Respiratory: Negative for cough, chest tightness, shortness of breath and wheezing.   Cardiovascular: Negative for chest pain, palpitations and leg swelling.  Endocrine: Negative for cold intolerance, heat intolerance, polydipsia, polyphagia and polyuria.  Genitourinary: Negative for dysuria, frequency and difficulty urinating.  Neurological: Negative for dizziness, light-headedness, numbness and headaches.  Psychiatric/Behavioral: Negative for behavioral problems, decreased concentration and agitation. The patient is not nervous/anxious.     History Past Medical History  Diagnosis Date  . Hypertension   . Hyperlipidemia   . Deafness     Right Ear  . Diabetes mellitus   . Colon polyps     She has past surgical history that includes Abdominal hysterectomy; Tonsillectomy; Cervical fusion (07/2004); and Colonoscopy (8/11).   Her family history includes Alcohol abuse in her other and another family member; Cancer in her father; Clotting disorder in her sister; Coronary artery disease in her  mother; Factor V Leiden deficiency in her other; Heart attack in an other family member; Heart disease in her other; Leukemia in her maternal uncle; Lung cancer in her father; Stroke in an other family member.She reports that she has never smoked. She has never used smokeless tobacco. She reports that she does not drink alcohol or use illicit drugs.  Current Outpatient Prescriptions on File Prior to Visit  Medication Sig Dispense Refill  . albuterol (PROAIR HFA) 108 (90 BASE) MCG/ACT inhaler Inhale 2 puffs into the lungs every 6 (six) hours as needed for wheezing. 1 Inhaler 2  . ascorbic acid (VITAMIN C) 500 MG tablet Take 500 mg by mouth daily.      Marland Kitchen aspirin 81 MG tablet Take 81 mg by mouth daily.      . Cholecalciferol (VITAMIN D) 400 UNITS capsule Take 1,200 Units by mouth daily.      Marland Kitchen escitalopram (LEXAPRO) 20 MG tablet Take 1 tablet (20 mg total) by mouth daily. Office visit due now 90 tablet 0  . fish oil-omega-3 fatty acids 1000 MG capsule Take 1 g by mouth daily.      . fluticasone (FLONASE) 50 MCG/ACT nasal spray Place 2 sprays into the nose daily. 16 g 2  . folic acid (FOLVITE) 1 MG tablet Take 1 tablet (1 mg total) by mouth daily. 90 tablet 0  . Multiple Vitamin (MULTIVITAMIN) tablet Take 1 tablet by mouth daily.      Marland Kitchen omeprazole (PRILOSEC OTC) 20 MG tablet Take 20 mg by mouth daily.      . ONE TOUCH LANCETS MISC qd 200 each 1  . ONETOUCH DELICA LANCETS MISC 1 Device by Does not  apply route 2 (two) times daily.      . Saxagliptin-Metformin 2.03-999 MG TB24 Take 2 tablets by mouth daily. 180 tablet 0  . valsartan-hydrochlorothiazide (DIOVAN-HCT) 160-25 MG per tablet Take 1 tablet by mouth daily. 90 tablet 0   No current facility-administered medications on file prior to visit.     Objective:  Objective Physical Exam  Constitutional: She is oriented to person, place, and time. She appears well-developed and well-nourished.  HENT:  Head: Normocephalic and atraumatic.  Eyes:  Conjunctivae and EOM are normal.  Neck: Normal range of motion. Neck supple. No JVD present. Carotid bruit is not present. No thyromegaly present.  Cardiovascular: Normal rate, regular rhythm and normal heart sounds.   No murmur heard. Pulmonary/Chest: Effort normal and breath sounds normal. No respiratory distress. She has no wheezes. She has no rales. She exhibits no tenderness.  Musculoskeletal: She exhibits no edema.  Neurological: She is alert and oriented to person, place, and time.  Psychiatric: She has a normal mood and affect.  Sensory exam of the foot is normal, tested with the monofilament. Good pulses, no lesions or ulcers, good peripheral pulses.  BP 120/82 mmHg  Pulse 63  Temp(Src) 98.6 F (37 C) (Oral)  Wt 273 lb 12.8 oz (124.195 kg)  SpO2 95% Wt Readings from Last 3 Encounters:  02/27/15 273 lb 12.8 oz (124.195 kg)  07/15/14 269 lb 13.5 oz (122.4 kg)  12/16/13 269 lb 9.6 oz (122.29 kg)     Lab Results  Component Value Date   WBC 9.4 07/15/2014   HGB 14.2 07/15/2014   HCT 42.7 07/15/2014   PLT 164.0 07/15/2014   GLUCOSE 116* 07/15/2014   CHOL 164 07/15/2014   TRIG 149.0 07/15/2014   HDL 42.50 07/15/2014   LDLCALC 92 07/15/2014   ALT 21 07/15/2014   AST 24 07/15/2014   NA 138 07/15/2014   K 4.4 07/15/2014   CL 100 07/15/2014   CREATININE 0.7 07/15/2014   BUN 13 07/15/2014   CO2 31 07/15/2014   TSH 1.15 07/15/2014   HGBA1C 7.3* 07/15/2014   MICROALBUR 2.1* 07/15/2014    Mm Screening Breast Tomo Bilateral  07/06/2014   CLINICAL DATA:  Screening.  EXAM: DIGITAL SCREENING BILATERAL MAMMOGRAM WITH 3D TOMO WITH CAD  COMPARISON:  Previous exam(s).  ACR Breast Density Category c: The breast tissue is heterogeneously dense, which may obscure small masses.  FINDINGS: There are no findings suspicious for malignancy. Images were processed with CAD.  IMPRESSION: No mammographic evidence of malignancy. A result letter of this screening mammogram will be mailed directly  to the patient.  RECOMMENDATION: Screening mammogram in one year. (Code:SM-B-01Y)  BI-RADS CATEGORY  1: Negative.   Electronically Signed   By: Jacqueline Duran M.D.   On: 07/06/2014 14:02     Assessment & Plan:  Plan I have discontinued Ms. Unterreiner's dapagliflozin propanediol and dapagliflozin propanediol. I am also having her start on canagliflozin. Additionally, I am having her maintain her ascorbic acid, aspirin, fish oil-omega-3 fatty acids, multivitamin, ONETOUCH DELICA LANCETS, omeprazole, Vitamin D, fluticasone, albuterol, ONE TOUCH LANCETS, Saxagliptin-Metformin, folic acid, escitalopram, valsartan-hydrochlorothiazide, LIPITOR, and potassium chloride.  Meds ordered this encounter  Medications  . LIPITOR 10 MG tablet    Sig: Take 1 tablet (10 mg total) by mouth daily.    Dispense:  90 tablet    Refill:  3    D/C PREVIOUS SCRIPTS FOR THIS MEDICATION  . potassium chloride (K-DUR) 10 MEQ tablet    Sig: Take 2  tablets (20 mEq total) by mouth 2 (two) times daily.    Dispense:  180 tablet    Refill:  3    D/C PREVIOUS SCRIPTS FOR THIS MEDICATION  . DISCONTD: dapagliflozin propanediol (FARXIGA) 5 MG TABS tablet    Sig: Take 5 mg by mouth daily.    Dispense:  30 tablet    Refill:  2  . canagliflozin (INVOKANA) 100 MG TABS tablet    Sig: Take 1 tablet (100 mg total) by mouth daily.    Dispense:  30 tablet    Refill:  5    Problem List Items Addressed This Visit    None    Visit Diagnoses    Hypokalemia    -  Primary    Relevant Medications    potassium chloride (K-DUR) 10 MEQ tablet    Other Relevant Orders    Basic metabolic panel    Hyperlipidemia        Relevant Medications    LIPITOR 10 MG tablet    Other Relevant Orders    Hepatic function panel    Lipid panel    Diabetes mellitus type II, controlled        Relevant Medications    LIPITOR 10 MG tablet    canagliflozin (INVOKANA) 100 MG TABS tablet    Other Relevant Orders    Hemoglobin A1c    Essential hypertension         Relevant Medications    LIPITOR 10 MG tablet    Other Relevant Orders    Basic metabolic panel       Follow-up: Return in about 6 months (around 08/29/2015), or if symptoms worsen or fail to improve, for f/u and labs.  Garnet Koyanagi, DO

## 2015-02-27 NOTE — Progress Notes (Signed)
Pre visit review using our clinic review tool, if applicable. No additional management support is needed unless otherwise documented below in the visit note. 

## 2015-02-27 NOTE — Patient Instructions (Signed)

## 2015-02-27 NOTE — Telephone Encounter (Signed)
Prior authorization for [employer] initiated. Awaiting determination. JG//CMA

## 2015-02-28 ENCOUNTER — Telehealth: Payer: Self-pay

## 2015-02-28 DIAGNOSIS — E118 Type 2 diabetes mellitus with unspecified complications: Secondary | ICD-10-CM

## 2015-02-28 DIAGNOSIS — E785 Hyperlipidemia, unspecified: Secondary | ICD-10-CM

## 2015-02-28 NOTE — Telephone Encounter (Signed)
-----   Message from Rosalita Chessman, DO sent at 02/27/2015  9:22 PM EDT ----- Not controlled but pt was not taking all her meds. con't meds Recheck 3 months---- lipid, hep, bmp, hgba1c----DM II, hyperlipidemia

## 2015-02-28 NOTE — Telephone Encounter (Signed)
Informed of results verbalized understanding, future labs ordered.

## 2015-03-16 ENCOUNTER — Telehealth: Payer: Self-pay | Admitting: Family Medicine

## 2015-03-16 DIAGNOSIS — E119 Type 2 diabetes mellitus without complications: Secondary | ICD-10-CM

## 2015-03-16 NOTE — Telephone Encounter (Signed)
CallerEmpress Newmann Ph#: [phone]  Pt states she called to get appt with Dr. Tyson Dense 03/28/15. He stated with her insurance she needs to have a referral sent over from [place] for Podiatry.

## 2015-03-16 NOTE — Telephone Encounter (Signed)
Ref placed.      KP 

## 2015-03-28 ENCOUNTER — Ambulatory Visit: Payer: 59 | Admitting: Podiatry

## 2015-04-06 ENCOUNTER — Telehealth: Payer: Self-pay | Admitting: *Deleted

## 2015-04-06 ENCOUNTER — Ambulatory Visit (INDEPENDENT_AMBULATORY_CARE_PROVIDER_SITE_OTHER): Payer: 59 | Admitting: Podiatry

## 2015-04-06 ENCOUNTER — Encounter: Payer: Self-pay | Admitting: Podiatry

## 2015-04-06 VITALS — BP 137/58 | HR 75 | Resp 16

## 2015-04-06 DIAGNOSIS — L89891 Pressure ulcer of other site, stage 1: Secondary | ICD-10-CM | POA: Diagnosis not present

## 2015-04-06 DIAGNOSIS — E11621 Type 2 diabetes mellitus with foot ulcer: Secondary | ICD-10-CM

## 2015-04-06 DIAGNOSIS — L97529 Non-pressure chronic ulcer of other part of left foot with unspecified severity: Principal | ICD-10-CM

## 2015-04-06 MED ORDER — AMOXICILLIN-POT CLAVULANATE 875-125 MG PO TABS: 1.0000 | ORAL_TABLET | Freq: Two times a day (BID) | ORAL | Status: DC

## 2015-04-06 MED ORDER — MUPIROCIN 2 % EX OINT: TOPICAL_OINTMENT | CUTANEOUS | Status: DC

## 2015-04-06 NOTE — Progress Notes (Signed)
° °  Subjective:    Patient ID: Jacqueline Duran, female    DOB: [birth year], 64 y.o.   MRN: [id number]  HPI  N: Ache, with high fever it gets bad/sharp pain L: Left foot on outside of pinky foot D: A couple of months (April 18th) O: Sudden C: Ache, bareable pain for now A: Shoes, swelling, walking w/preaaure, soft drinks T: Soaking ebsant salt, neosporn, Keflex medi helped with the infection   Review of Systems  All other systems reviewed and are negative.      Objective:   Physical Exam: I have reviewed her past medical history medications allergy surgery social history and review of systems. Pulses are barely palpable bilateral. Sluggish capillary refill time Neurologic sensorium is intact per Semmes-Weinstein monofilament.. Deep tendon reflexes are intact bilateral muscle strength +5 over 5 dorsiflexion plantar flexors and inverters everters onto the musculature is intact. Considerable amount of edema to the bilateral foot particular to the left foot. Orthopedic evaluation shows also assess ankle full range of motion without crepitus she has a superficial ulceration measuring proximally centimeter diameter left foot. Ulceration appears to be dirty with Lint and hair. There is noted. There is no purulence no malodor. This does not probe to bone. Mild cellulitis is noted surrounding the wound.       Assessment & Plan:   assessment: Diabetic ulceration with peripheral vascular disease. Ulceration metatarsal left. Cellulitis fifth metatarsal left.  Plan: Started her on Augmentin 875 mg 1 by mouth twice a day. She will start with Epsom salts and warm water soaks dry and clean well. She will apply Bactroban ointment and a dry sterile compressive dressing twice daily. We also dispensed a Darco shoe. Follow up with her in 2 weeks. We sent her for vascular evaluation.

## 2015-04-10 NOTE — Telephone Encounter (Signed)
Entered in error

## 2015-04-18 ENCOUNTER — Ambulatory Visit (HOSPITAL_COMMUNITY)
Admission: RE | Admit: 2015-04-18 | Discharge: 2015-04-18 | Disposition: A | Discharge status: Home / Self Care | Payer: 59 | Source: Ambulatory Visit | Attending: Internal Medicine | Admitting: Internal Medicine

## 2015-04-18 DIAGNOSIS — L97529 Non-pressure chronic ulcer of other part of left foot with unspecified severity: Secondary | ICD-10-CM | POA: Diagnosis not present

## 2015-04-18 DIAGNOSIS — E11621 Type 2 diabetes mellitus with foot ulcer: Secondary | ICD-10-CM

## 2015-04-20 ENCOUNTER — Encounter: Payer: Self-pay | Admitting: Podiatry

## 2015-04-20 ENCOUNTER — Ambulatory Visit (INDEPENDENT_AMBULATORY_CARE_PROVIDER_SITE_OTHER): Payer: 59 | Admitting: Podiatry

## 2015-04-20 VITALS — BP 127/74 | HR 74 | Resp 12

## 2015-04-20 DIAGNOSIS — L97529 Non-pressure chronic ulcer of other part of left foot with unspecified severity: Principal | ICD-10-CM

## 2015-04-20 DIAGNOSIS — L89891 Pressure ulcer of other site, stage 1: Secondary | ICD-10-CM

## 2015-04-20 DIAGNOSIS — E11621 Type 2 diabetes mellitus with foot ulcer: Secondary | ICD-10-CM

## 2015-04-20 NOTE — Progress Notes (Signed)
The ulceration to the left foot appears to be healing she says. She does relate wearing regular shoes and not wearing her Darco shoe. However she continues her Vioxx until they're completed and currently dresses the foot with ointment and a dressing daily.  Objective: Vital signs are stable she is alert and oriented 3 no signs of systemic infection. Evaluation of the left foot doesn't demonstrate much decrease in diameter of the ulceration now measures only and proximally 3 mm internal diameter necrotic tissue was sharply resected today with bleeding. No signs of infection and no signs of probing  Assessment: Diabetic ulceration left foot.  Plan: Debrided all reactive hyperkeratosis today redressed. Discussed how she should take care of this wound and I will follow-up with her in 2-3 weeks. We have yet to obtain the final results from her vascular evaluation.

## 2015-04-21 ENCOUNTER — Telehealth: Payer: Self-pay | Admitting: Family Medicine

## 2015-04-21 NOTE — Telephone Encounter (Signed)
Patient said she stopped her Invokana because it was causing itching. She will call the insurance company and request a formulary or see what they cover and get back to [place] on what she can take.     KP

## 2015-04-21 NOTE — Telephone Encounter (Signed)
Patient calling to report that she is having a allergic reaction to the canagliflozin (INVOKANA) 100 MG TABS tablet, requesting doctor to discuss with insurance about trying something else [phone] or [phone]

## 2015-04-21 NOTE — Telephone Encounter (Signed)
Msg left to call.     KP

## 2015-04-24 NOTE — Telephone Encounter (Signed)
noted 

## 2015-04-24 NOTE — Telephone Encounter (Signed)
tradjenta 5 mg 1 po qd #30  , 2 refills

## 2015-04-24 NOTE — Telephone Encounter (Signed)
Patient can take Onglyza and Tradjenta. Please advise    KP

## 2015-04-25 MED ORDER — LINAGLIPTIN 5 MG PO TABS: 5.0000 mg | ORAL_TABLET | Freq: Every day | ORAL | Status: DC

## 2015-04-25 NOTE — Telephone Encounter (Signed)
Rx faxed for Tradjenta and message left on VM to make the patient aware.      KP

## 2015-04-27 ENCOUNTER — Telehealth (HOSPITAL_COMMUNITY): Payer: Self-pay | Admitting: *Deleted

## 2015-05-04 ENCOUNTER — Ambulatory Visit (INDEPENDENT_AMBULATORY_CARE_PROVIDER_SITE_OTHER): Payer: 59 | Admitting: Podiatry

## 2015-05-04 ENCOUNTER — Encounter: Payer: Self-pay | Admitting: Podiatry

## 2015-05-04 ENCOUNTER — Ambulatory Visit (INDEPENDENT_AMBULATORY_CARE_PROVIDER_SITE_OTHER): Payer: 59

## 2015-05-04 VITALS — BP 119/66 | HR 76 | Resp 18

## 2015-05-04 DIAGNOSIS — L97529 Non-pressure chronic ulcer of other part of left foot with unspecified severity: Principal | ICD-10-CM

## 2015-05-04 DIAGNOSIS — L89891 Pressure ulcer of other site, stage 1: Secondary | ICD-10-CM

## 2015-05-04 DIAGNOSIS — E11621 Type 2 diabetes mellitus with foot ulcer: Secondary | ICD-10-CM | POA: Diagnosis not present

## 2015-05-07 NOTE — Progress Notes (Signed)
She presents today for follow-up of ulceration fifth metatarsal of the left foot. Vascular evaluation, back negative. She states that the toe and foot stay swollen. She continues her conservative therapies.  Objective: Vital signs are stable she is alert and oriented 3 presents today with ulceration fifth metatarsal lateral head. Radiographs do not demonstrate osteomyelitic changes at this point. No cellulitis just mild edema. Ulcerative lesion measures less than 5 mm in diameter.  Assessment: Ulceration fifth metatarsal left foot.  Plan: She's continue all conservative therapies and I believe she is to start a new diabetes mellitus medication to help decrease her sugar level. It is possible that that is all this will take his to get her blood sugar under control. Obviously the longer this stays open the greater the chance of osteomyelitis

## 2015-05-19 ENCOUNTER — Encounter: Payer: Self-pay | Admitting: Internal Medicine

## 2015-05-21 ENCOUNTER — Other Ambulatory Visit: Payer: Self-pay | Admitting: Family Medicine

## 2015-05-25 ENCOUNTER — Telehealth: Payer: Self-pay | Admitting: Family Medicine

## 2015-05-25 MED ORDER — ONETOUCH DELICA LANCETS FINE MISC: Status: DC

## 2015-05-25 MED ORDER — GLUCOSE BLOOD VI STRP
ORAL_STRIP | Status: DC
Start: 2015-05-25 — End: 2015-12-12

## 2015-05-25 NOTE — Telephone Encounter (Signed)
Rx faxed.    KP 

## 2015-05-25 NOTE — Telephone Encounter (Signed)
Caller name:Lidiya Relationship to patient:self Can be [phone] Pharmacy: [facility] [address] [place]   Reason for call:she needs lancets and test strips for one touch ultra mini

## 2015-06-08 ENCOUNTER — Ambulatory Visit: Payer: 59 | Admitting: Podiatry

## 2015-06-29 ENCOUNTER — Other Ambulatory Visit: Payer: Self-pay

## 2015-06-29 DIAGNOSIS — Z1231 Encounter for screening mammogram for malignant neoplasm of breast: Secondary | ICD-10-CM

## 2015-07-01 ENCOUNTER — Other Ambulatory Visit: Payer: Self-pay | Admitting: Family Medicine

## 2015-07-21 ENCOUNTER — Ambulatory Visit
Admission: RE | Admit: 2015-07-21 | Discharge: 2015-07-21 | Disposition: A | Discharge status: Home / Self Care | Payer: 59 | Source: Ambulatory Visit

## 2015-07-21 DIAGNOSIS — Z1231 Encounter for screening mammogram for malignant neoplasm of breast: Secondary | ICD-10-CM

## 2015-08-10 ENCOUNTER — Telehealth: Payer: Self-pay | Admitting: *Deleted

## 2015-08-10 NOTE — Telephone Encounter (Signed)
PA for kombiglyze XR 2.03-999 mg tabs initiated. Awaiting determination. JG//CMA

## 2015-08-16 NOTE — Telephone Encounter (Signed)
PA denied. Covered alternatives include Janumet, Janumet XR, Jentadueto, and Jentadueto XR. Please advise. JG//CMA

## 2015-08-17 ENCOUNTER — Encounter: Payer: 59 | Admitting: Family Medicine

## 2015-08-17 ENCOUNTER — Telehealth: Payer: Self-pay

## 2015-08-17 MED ORDER — SITAGLIP PHOS-METFORMIN HCL ER 50-1000 MG PO TB24: 2.0000 | ORAL_TABLET | Freq: Every day | ORAL | Status: DC

## 2015-08-17 NOTE — Telephone Encounter (Signed)
Patient has been made aware and verbalized understanding. Lab apt scheduled for tomorrow. Rx faxed.      KP

## 2015-08-17 NOTE — Telephone Encounter (Signed)
Patient cannot make her 1:30pm appt today because something urgent came up at work she is very sorry however she needs a refill on her [place] xr patient stated  you can call her at her office number if you have any questions [phone]

## 2015-08-17 NOTE — Telephone Encounter (Signed)
janumet xr 50/1000 2 po qd #60  2 refills 

## 2015-08-17 NOTE — Telephone Encounter (Signed)
Ok, apt canceled on the system per Dr.Lowne and Rx faxed for Janument XR due to Insurance no longer covering [place].       KP

## 2015-08-17 NOTE — Addendum Note (Signed)
Addended by: Ewing Schlein on: 08/17/2015 02:10 PM   Modules accepted: Orders

## 2015-08-18 ENCOUNTER — Other Ambulatory Visit (INDEPENDENT_AMBULATORY_CARE_PROVIDER_SITE_OTHER): Payer: 59

## 2015-08-18 ENCOUNTER — Other Ambulatory Visit: Payer: 59

## 2015-08-18 DIAGNOSIS — E118 Type 2 diabetes mellitus with unspecified complications: Secondary | ICD-10-CM | POA: Diagnosis not present

## 2015-08-18 DIAGNOSIS — E785 Hyperlipidemia, unspecified: Secondary | ICD-10-CM | POA: Diagnosis not present

## 2015-08-18 LAB — BASIC METABOLIC PANEL
BUN: 12 mg/dL (ref 6–23)
CALCIUM: 9.2 mg/dL (ref 8.4–10.5)
CO2: 33 meq/L — AB (ref 19–32)
Chloride: 101 mEq/L (ref 96–112)
Creatinine, Ser: 0.77 mg/dL (ref 0.40–1.20)
GFR: 80.74 mL/min (ref >60.00)
GLUCOSE: 173 mg/dL — AB (ref 70–99)
Potassium: 4.9 mEq/L (ref 3.5–5.1)
SODIUM: 140 meq/L (ref 135–145)

## 2015-08-18 LAB — LIPID PANEL
Cholesterol: 111 mg/dL (ref 0–200)
HDL: 41 mg/dL (ref >39.00)
LDL Cholesterol: 47 mg/dL (ref 0–99)
NONHDL: 69.67
Total CHOL/HDL Ratio: 3
Triglycerides: 114 mg/dL (ref 0.0–149.0)
VLDL: 22.8 mg/dL (ref 0.0–40.0)

## 2015-08-18 LAB — HEPATIC FUNCTION PANEL
ALK PHOS: 128 U/L — AB (ref 39–117)
ALT: 30 U/L (ref 0–35)
AST: 29 U/L (ref 0–37)
Albumin: 3.7 g/dL (ref 3.5–5.2)
BILIRUBIN TOTAL: 0.5 mg/dL (ref 0.2–1.2)
Bilirubin, Direct: 0.1 mg/dL (ref 0.0–0.3)
Total Protein: 7.2 g/dL (ref 6.0–8.3)

## 2015-08-18 LAB — HEMOGLOBIN A1C: Hgb A1c MFr Bld: 7.2 % — ABNORMAL HIGH (ref 4.6–6.5)

## 2015-09-01 ENCOUNTER — Other Ambulatory Visit: Payer: Self-pay

## 2015-09-01 MED ORDER — CANAGLIFLOZIN 300 MG PO TABS
300.0000 mg | ORAL_TABLET | Freq: Every day | ORAL | Status: DC
Start: 2015-09-01 — End: 2015-09-05

## 2015-09-04 ENCOUNTER — Telehealth: Payer: Self-pay | Admitting: *Deleted

## 2015-09-04 NOTE — Telephone Encounter (Signed)
PA initiated. Awaiting determination. JG//CMA 

## 2015-09-05 MED ORDER — DAPAGLIFLOZIN PROPANEDIOL 5 MG PO TABS: 5.0000 mg | ORAL_TABLET | Freq: Every day | ORAL | Status: DC

## 2015-09-05 NOTE — Telephone Encounter (Signed)
farxiga 5 mg #30  1 po qam , 2 refills

## 2015-09-05 NOTE — Addendum Note (Signed)
Addended by: Murtis Sink A on: 09/05/2015 12:44 PM   Modules accepted: Orders, Medications

## 2015-09-05 NOTE — Telephone Encounter (Signed)
PA denied. Covered alternatives include [place] and Jardiance. Please advise. JG//CMA

## 2015-09-05 NOTE — Telephone Encounter (Signed)
E-scribed to pharmacy. MAR updated. JG//CMA

## 2015-10-01 ENCOUNTER — Other Ambulatory Visit: Payer: Self-pay | Admitting: Family Medicine

## 2015-11-16 ENCOUNTER — Other Ambulatory Visit: Payer: Self-pay | Admitting: Family Medicine

## 2015-11-30 ENCOUNTER — Encounter: Payer: 59 | Admitting: Family Medicine

## 2015-12-11 ENCOUNTER — Telehealth: Payer: Self-pay | Admitting: *Deleted

## 2015-12-11 NOTE — Telephone Encounter (Signed)
Unable to reach patient at time of pre-visit call. Left message for patient to return call when available.  

## 2015-12-12 ENCOUNTER — Encounter: Payer: Self-pay | Admitting: Family Medicine

## 2015-12-12 ENCOUNTER — Ambulatory Visit (INDEPENDENT_AMBULATORY_CARE_PROVIDER_SITE_OTHER): Payer: 59 | Admitting: Family Medicine

## 2015-12-12 VITALS — BP 110/78 | HR 64 | Temp 98.6°F | Ht 64.0 in | Wt 260.2 lb

## 2015-12-12 DIAGNOSIS — Z0000 Encounter for general adult medical examination without abnormal findings: Secondary | ICD-10-CM | POA: Diagnosis not present

## 2015-12-12 DIAGNOSIS — F411 Generalized anxiety disorder: Secondary | ICD-10-CM

## 2015-12-12 DIAGNOSIS — E785 Hyperlipidemia, unspecified: Secondary | ICD-10-CM

## 2015-12-12 DIAGNOSIS — Z1159 Encounter for screening for other viral diseases: Secondary | ICD-10-CM | POA: Diagnosis not present

## 2015-12-12 DIAGNOSIS — IMO0001 Reserved for inherently not codable concepts without codable children: Secondary | ICD-10-CM

## 2015-12-12 DIAGNOSIS — E1165 Type 2 diabetes mellitus with hyperglycemia: Secondary | ICD-10-CM

## 2015-12-12 DIAGNOSIS — Z114 Encounter for screening for human immunodeficiency virus [HIV]: Secondary | ICD-10-CM

## 2015-12-12 DIAGNOSIS — I10 Essential (primary) hypertension: Secondary | ICD-10-CM | POA: Diagnosis not present

## 2015-12-12 LAB — MICROALBUMIN / CREATININE URINE RATIO
CREATININE, U: 83.3 mg/dL
Microalb Creat Ratio: 0.8 mg/g (ref 0.0–30.0)
Microalb, Ur: <0.7 mg/dL (ref 0.0–1.9)

## 2015-12-12 LAB — COMPREHENSIVE METABOLIC PANEL
ALT: 15 U/L (ref 0–35)
AST: 19 U/L (ref 0–37)
Albumin: 4 g/dL (ref 3.5–5.2)
Alkaline Phosphatase: 96 U/L (ref 39–117)
BILIRUBIN TOTAL: 0.6 mg/dL (ref 0.2–1.2)
BUN: 12 mg/dL (ref 6–23)
CALCIUM: 9.4 mg/dL (ref 8.4–10.5)
CHLORIDE: 100 meq/L (ref 96–112)
CO2: 35 meq/L — AB (ref 19–32)
Creatinine, Ser: 0.65 mg/dL (ref 0.40–1.20)
GFR: 98.07 mL/min (ref >60.00)
Glucose, Bld: 109 mg/dL — ABNORMAL HIGH (ref 70–99)
Potassium: 3.9 mEq/L (ref 3.5–5.1)
Sodium: 140 mEq/L (ref 135–145)
Total Protein: 7.5 g/dL (ref 6.0–8.3)

## 2015-12-12 LAB — CBC WITH DIFFERENTIAL/PLATELET
BASOS ABS: 0.1 10*3/uL (ref 0.0–0.1)
BASOS PCT: 0.3 % (ref 0.0–3.0)
Eosinophils Absolute: 0.4 10*3/uL (ref 0.0–0.7)
Eosinophils Relative: 2.4 % (ref 0.0–5.0)
HEMATOCRIT: 45.9 % (ref 36.0–46.0)
Hemoglobin: 14.9 g/dL (ref 12.0–15.0)
LYMPHS PCT: 22.4 % (ref 12.0–46.0)
Lymphs Abs: 3.4 10*3/uL (ref 0.7–4.0)
MCHC: 32.5 g/dL (ref 30.0–36.0)
MCV: 91.4 fl (ref 78.0–100.0)
MONOS PCT: 6.2 % (ref 3.0–12.0)
Monocytes Absolute: 0.9 10*3/uL (ref 0.1–1.0)
NEUTROS ABS: 10.5 10*3/uL — AB (ref 1.4–7.7)
Neutrophils Relative %: 68.7 % (ref 43.0–77.0)
PLATELETS: 207 10*3/uL (ref 150.0–400.0)
RBC: 5.02 Mil/uL (ref 3.87–5.11)
RDW: 13.4 % (ref 11.5–15.5)
WBC: 15.3 10*3/uL — ABNORMAL HIGH (ref 4.0–10.5)

## 2015-12-12 LAB — LIPID PANEL
CHOL/HDL RATIO: 3
Cholesterol: 124 mg/dL (ref 0–200)
HDL: 43.2 mg/dL (ref >39.00)
LDL CALC: 48 mg/dL (ref 0–99)
NONHDL: 80.42
Triglycerides: 162 mg/dL — ABNORMAL HIGH (ref 0.0–149.0)
VLDL: 32.4 mg/dL (ref 0.0–40.0)

## 2015-12-12 LAB — HEMOGLOBIN A1C: Hgb A1c MFr Bld: 7.3 % — ABNORMAL HIGH (ref 4.6–6.5)

## 2015-12-12 LAB — TSH: TSH: 1.61 u[IU]/mL (ref 0.35–4.50)

## 2015-12-12 MED ORDER — FOLIC ACID 1 MG PO TABS: ORAL_TABLET | ORAL | Status: DC

## 2015-12-12 MED ORDER — LIPITOR 10 MG PO TABS: 10.0000 mg | ORAL_TABLET | Freq: Every day | ORAL | Status: DC

## 2015-12-12 MED ORDER — GLUCOSE BLOOD VI STRP: ORAL_STRIP | Status: DC

## 2015-12-12 MED ORDER — DAPAGLIFLOZIN PROPANEDIOL 5 MG PO TABS: 5.0000 mg | ORAL_TABLET | Freq: Every day | ORAL | Status: DC

## 2015-12-12 MED ORDER — ESCITALOPRAM OXALATE 20 MG PO TABS
20.0000 mg | ORAL_TABLET | Freq: Every day | ORAL | Status: DC
Start: 2015-12-12 — End: 2016-03-15

## 2015-12-12 MED ORDER — ONETOUCH DELICA LANCETS FINE MISC: Status: DC

## 2015-12-12 MED ORDER — SITAGLIP PHOS-METFORMIN HCL ER 50-1000 MG PO TB24: 2.0000 | ORAL_TABLET | Freq: Every day | ORAL | Status: DC

## 2015-12-12 NOTE — Progress Notes (Signed)
Subjective:     Jacqueline Duran is a 65 y.o. female and is here for a comprehensive physical exam. The patient reports no problems.  Social History   Social History  . Marital Status: Married    Spouse Name: N/A  . Number of Children: N/A  . Years of Education: N/A   Occupational History  . unemployed    Social History Main Topics  . Smoking status: Never Smoker   . Smokeless tobacco: Never Used  . Alcohol Use: No  . Drug Use: No  . Sexual Activity:    Partners: Male   Other Topics Concern  . Not on file   Social History Narrative   Husband dx with dementia 12/2011   Health Maintenance  Topic Date Due  . Hepatitis C Screening  1951/11/01  . FOOT EXAM  07/16/2015  . HIV Screening  02/27/2016 (Originally 08/21/1968)  . PAP SMEAR  12/11/2016 (Originally 05/19/2014)  . HEMOGLOBIN A1C  02/16/2016  . INFLUENZA VACCINE  06/04/2016  . OPHTHALMOLOGY EXAM  10/04/2016  . PNEUMOCOCCAL POLYSACCHARIDE VACCINE (2) 06/04/2017  . MAMMOGRAM  07/20/2017  . TETANUS/TDAP  05/17/2019  . COLONOSCOPY  06/12/2020  . ZOSTAVAX  Completed    The following portions of the patient's history were reviewed and updated as appropriate:  She  has a past medical history of Hypertension; Hyperlipidemia; Deafness; Diabetes mellitus; and Colon polyps. She  does not have any pertinent problems on file. She  has past surgical history that includes Abdominal hysterectomy; Tonsillectomy; Cervical fusion (07/2004); and Colonoscopy (8/11). Her family history includes Alcohol abuse in her other; Cancer in her father; Clotting disorder in her sister; Coronary artery disease in her mother; Factor V Leiden deficiency in her other; Heart disease in her other; Leukemia in her maternal uncle; Lung cancer in her father. She  reports that she has never smoked. She has never used smokeless tobacco. She reports that she does not drink alcohol or use illicit drugs. She has a current medication list which includes the  following prescription(s): ascorbic acid, aspirin, dapagliflozin propanediol, escitalopram, folic acid, glucose blood, lactobacillus, lipitor, magnesium, megared omega-3 krill oil, multivitamin, onetouch delica lancets fine, sitagliptin-metformin hcl, tradjenta, turmeric, valsartan-hydrochlorothiazide, and vitamin e. Current Outpatient Prescriptions on File Prior to Visit  Medication Sig Dispense Refill  . ascorbic acid (VITAMIN C) 500 MG tablet Take 1,000 mg by mouth daily.     Marland Kitchen aspirin 81 MG tablet Take 81 mg by mouth daily.      . dapagliflozin propanediol (FARXIGA) 5 MG TABS tablet Take 5 mg by mouth daily. 30 tablet 2  . escitalopram (LEXAPRO) 20 MG tablet TAKE ONE TABLET BY MOUTH DAILY 90 tablet 1  . folic acid (FOLVITE) 1 MG tablet TAKE 1 TABLET(1 MG) BY MOUTH DAILY 90 tablet 1  . glucose blood (ONE TOUCH ULTRA TEST) test strip Check blood sugar once daily. Dx:E11.9 100 each 5  . LIPITOR 10 MG tablet Take 1 tablet (10 mg total) by mouth daily. 90 tablet 3  . Multiple Vitamin (MULTIVITAMIN) tablet Take 1 tablet by mouth daily.      [name] DELICA LANCETS FINE MISC Check blood sugar once daily. Dx:E11.9 100 each 5  . SitaGLIPtin-MetFORMIN HCl 50-1000 MG TB24 Take 2 tablets by mouth daily. 60 tablet 2  . TRADJENTA 5 MG TABS tablet TAKE 1 TABLET(5 MG) BY MOUTH DAILY 30 tablet 4  . valsartan-hydrochlorothiazide (DIOVAN-HCT) 160-25 MG tablet TAKE 1 TABLET BY MOUTH DAILY 90 tablet 0   No  current facility-administered medications on file prior to visit.   She is allergic to amlodipine besylate and other..  Review of Systems Review of Systems  Constitutional: Negative for activity change, appetite change and fatigue.  HENT: Negative for hearing loss, congestion, tinnitus and ear discharge.  dentist-- due Eyes: Negative for visual disturbance (see optho q1y -- vision corrected to 20/20 with glasses).  Respiratory: Negative for cough, chest tightness and shortness of breath.    Cardiovascular: Negative for chest pain, palpitations and leg swelling.  Gastrointestinal: Negative for abdominal pain, diarrhea, constipation and abdominal distention.  Genitourinary: Negative for urgency, frequency, decreased urine volume and difficulty urinating.  Musculoskeletal: Negative for back pain, arthralgias and gait problem.  Skin: Negative for color change, pallor and rash.  Neurological: Negative for dizziness, light-headedness, numbness and headaches.  Hematological: Negative for adenopathy. Does not bruise/bleed easily.  Psychiatric/Behavioral: Negative for suicidal ideas, confusion, sleep disturbance, self-injury, dysphoric mood, decreased concentration and agitation.       Objective:    BP 110/78 mmHg  Pulse 64  Temp(Src) 98.6 F (37 C) (Oral)  Ht _0  (1.626 m)  Wt 260 lb 3.2 oz (118.026 kg)  BMI 44.64 kg/m2  SpO2 98% General appearance: alert, cooperative, appears stated age and no distress Head: Normocephalic, without obvious abnormality, atraumatic Eyes: conjunctivae/corneas clear. PERRL, EOM's intact. Fundi benign. Ears: normal TM's and external ear canals both ears Nose: Nares normal. Septum midline. Mucosa normal. No drainage or sinus tenderness. Throat: lips, mucosa, and tongue normal; teeth and gums normal Neck: no adenopathy, no carotid bruit, no JVD, supple, symmetrical, trachea midline and thyroid not enlarged, symmetric, no tenderness/mass/nodules Back: symmetric, no curvature. ROM normal. No CVA tenderness. Lungs: clear to auscultation bilaterally Breasts: normal appearance, no masses or tenderness Heart: regular rate and rhythm, S1, S2 normal, no murmur, click, rub or gallop Abdomen: soft, non-tender; bowel sounds normal; no masses,  no organomegaly Pelvic: not indicated; status post hysterectomy, negative ROS Extremities: extremities normal, atraumatic, no cyanosis or edema Pulses: 2+ and symmetric Skin: Skin color, texture, turgor normal. No  rashes or lesions Lymph nodes: Cervical, supraclavicular, and axillary nodes normal. Neurologic: Alert and oriented X 3, normal strength and tone. Normal symmetric reflexes. Normal coordination and gait Psych- no depression, noanxiety   Assessment:    Healthy female exam.      Plan:    ghm utd Check labs See After Visit Summary for Counseling Recommendations    1. Preventative health care  - Comp Met (CMET) - CBC with Differential/Platelet - Lipid panel - Hemoglobin A1c - Microalbumin / creatinine urine ratio - POCT urinalysis dipstick - TSH - Hepatitis C antibody  2. Need for hepatitis C screening test  3. Uncontrolled type 2 diabetes mellitus without complication, without long-term current use of insulin (HCC)  - Comp Met (CMET) - CBC with Differential/Platelet - Lipid panel - Hemoglobin A1c - Microalbumin / creatinine urine ratio - POCT urinalysis dipstick - TSH - Hepatitis C antibody - dapagliflozin propanediol (FARXIGA) 5 MG TABS tablet; Take 5 mg by mouth daily.  Dispense: 30 tablet; Refill: 2 - folic acid (FOLVITE) 1 MG tablet; TAKE 1 TABLET(1 MG) BY MOUTH DAILY  Dispense: 90 tablet; Refill: 1 - glucose blood (ONE TOUCH ULTRA TEST) test strip; Check blood sugar once daily. Dx:E11.9  Dispense: 100 each; Refill: 5 - [place]; Check blood sugar once daily. Dx:E11.9  Dispense: 100 each; Refill: 5 - SitaGLIPtin-MetFORMIN HCl 50-1000 MG TB24; Take 2 tablets by mouth daily.  Dispense: 60 tablet; Refill: 2  4. Essential hypertension  Current outpatient prescriptions:  .  ascorbic acid (VITAMIN C) 500 MG tablet, Take 1,000 mg by mouth daily. , Disp: , Rfl:  .  aspirin 81 MG tablet, Take 81 mg by mouth daily.  , Disp: , Rfl:  .  dapagliflozin propanediol (FARXIGA) 5 MG TABS tablet, Take 5 mg by mouth daily., Disp: 30 tablet, Rfl: 2 .  escitalopram (LEXAPRO) 20 MG tablet, Take 1 tablet (20 mg total) by mouth daily., Disp: 90 tablet, Rfl: 1 .   folic acid (FOLVITE) 1 MG tablet, TAKE 1 TABLET(1 MG) BY MOUTH DAILY, Disp: 90 tablet, Rfl: 1 .  glucose blood (ONE TOUCH ULTRA TEST) test strip, Check blood sugar once daily. Dx:E11.9, Disp: 100 each, Rfl: 5 .  Lactobacillus (PROBIOTIC ACIDOPHILUS PO), Take by mouth., Disp: , Rfl:  .  LIPITOR 10 MG tablet, Take 1 tablet (10 mg total) by mouth daily., Disp: 90 tablet, Rfl: 3 .  Magnesium 500 MG TABS, Take 1 tablet by mouth daily., Disp: , Rfl:  .  MEGARED OMEGA-3 KRILL OIL 500 MG CAPS, Take 1 capsule by mouth daily., Disp: , Rfl:  .  Multiple Vitamin (MULTIVITAMIN) tablet, Take 1 tablet by mouth daily.  , Disp: , Rfl:  .  ONETOUCH DELICA LANCETS FINE MISC, Check blood sugar once daily. Dx:E11.9, Disp: 100 each, Rfl: 5 .  SitaGLIPtin-MetFORMIN HCl 50-1000 MG TB24, Take 2 tablets by mouth daily., Disp: 60 tablet, Rfl: 2 .  TRADJENTA 5 MG TABS tablet, TAKE 1 TABLET(5 MG) BY MOUTH DAILY, Disp: 30 tablet, Rfl: 4 .  Turmeric 500 MG CAPS, Take 1 capsule by mouth daily., Disp: , Rfl:  .  valsartan-hydrochlorothiazide (DIOVAN-HCT) 160-25 MG tablet, TAKE 1 TABLET BY MOUTH DAILY, Disp: 90 tablet, Rfl: 0 .  vitamin E 400 UNIT capsule, Take 400 Units by mouth daily., Disp: , Rfl:   - Comp Met (CMET) - CBC with Differential/Platelet - Lipid panel - Hemoglobin A1c - Microalbumin / creatinine urine ratio - POCT urinalysis dipstick - TSH - Hepatitis C antibody  5. Hyperlipidemia LDL goal <70 - Comp Met (CMET) - Lipid panel - LIPITOR 10 MG tablet; Take 1 tablet (10 mg total) by mouth daily.  Dispense: 90 tablet; Refill: 3  6 Generalized anxiety disorder  - escitalopram (LEXAPRO) 20 MG tablet; Take 1 tablet (20 mg total) by mouth daily.  Dispense: 90 tablet; Refill: 1  8. Screening for HIV (human immunodeficiency virus)   - HIV antibody  Subjective:     Jacqueline Duran is a 65 y.o. female and is here for a comprehensive physical exam. The patient reports no problems.  Social History   Social  History  . Marital Status: Married    Spouse Name: N/A  . Number of Children: N/A  . Years of Education: N/A   Occupational History  . unemployed    Social History Main Topics  . Smoking status: Never Smoker   . Smokeless tobacco: Never Used  . Alcohol Use: No  . Drug Use: No  . Sexual Activity:    Partners: Male   Other Topics Concern  . Not on file   Social History Narrative   Husband dx with dementia 12/2011   Health Maintenance  Topic Date Due  . Hepatitis C Screening  July 27, 1951  . FOOT EXAM  07/16/2015  . HIV Screening  02/27/2016 (Originally 08/21/1968)  . PAP SMEAR  12/11/2016 (Originally 05/19/2014)  . HEMOGLOBIN A1C  02/16/2016  .  INFLUENZA VACCINE  06/04/2016  . OPHTHALMOLOGY EXAM  10/04/2016  . PNEUMOCOCCAL POLYSACCHARIDE VACCINE (2) 06/04/2017  . MAMMOGRAM  07/20/2017  . TETANUS/TDAP  05/17/2019  . COLONOSCOPY  06/12/2020  . ZOSTAVAX  Completed    The following portions of the patient's history were reviewed and updated as appropriate:  She  has a past medical history of Hypertension; Hyperlipidemia; Deafness; Diabetes mellitus; and Colon polyps. She  does not have any pertinent problems on file. She  has past surgical history that includes Abdominal hysterectomy; Tonsillectomy; Cervical fusion (07/2004); and Colonoscopy (8/11). Her family history includes Alcohol abuse in her other; Cancer in her father; Clotting disorder in her sister; Coronary artery disease in her mother; Factor V Leiden deficiency in her other; Heart disease in her other; Leukemia in her maternal uncle; Lung cancer in her father. She  reports that she has never smoked. She has never used smokeless tobacco. She reports that she does not drink alcohol or use illicit drugs. She has a current medication list which includes the following prescription(s): ascorbic acid, aspirin, dapagliflozin propanediol, escitalopram, folic acid, glucose blood, lactobacillus, lipitor, magnesium, megared omega-3  krill oil, multivitamin, onetouch delica lancets fine, sitagliptin-metformin hcl, tradjenta, turmeric, valsartan-hydrochlorothiazide, and vitamin e. Current Outpatient Prescriptions on File Prior to Visit  Medication Sig Dispense Refill  . ascorbic acid (VITAMIN C) 500 MG tablet Take 1,000 mg by mouth daily.     Marland Kitchen aspirin 81 MG tablet Take 81 mg by mouth daily.      . Multiple Vitamin (MULTIVITAMIN) tablet Take 1 tablet by mouth daily.      . TRADJENTA 5 MG TABS tablet TAKE 1 TABLET(5 MG) BY MOUTH DAILY 30 tablet 4  . valsartan-hydrochlorothiazide (DIOVAN-HCT) 160-25 MG tablet TAKE 1 TABLET BY MOUTH DAILY 90 tablet 0   No current facility-administered medications on file prior to visit.   She is allergic to amlodipine besylate and other..  Review of Systems Review of Systems  Constitutional: Negative for activity change, appetite change and fatigue.  HENT: Negative for hearing loss, congestion, tinnitus and ear discharge.  dentist [id number]: Negative for visual disturbance (see optho q1y -- vision corrected to 20/20 with glasses).  Respiratory: Negative for cough, chest tightness and shortness of breath.   Cardiovascular: Negative for chest pain, palpitations and leg swelling.  Gastrointestinal: Negative for abdominal pain, diarrhea, constipation and abdominal distention.  Genitourinary: Negative for urgency, frequency, decreased urine volume and difficulty urinating.  Musculoskeletal: Negative for back pain, arthralgias and gait problem.  Skin: Negative for color change, pallor and rash.  Neurological: Negative for dizziness, light-headedness, numbness and headaches.  Hematological: Negative for adenopathy. Does not bruise/bleed easily.  Psychiatric/Behavioral: Negative for suicidal ideas, confusion, sleep disturbance, self-injury, dysphoric mood, decreased concentration and agitation.         Objective:    BP 110/78 mmHg  Pulse 64  Temp(Src) 98.6 F (37 C) (Oral)  Ht _0   (1.626 m)  Wt 260 lb 3.2 oz (118.026 kg)  BMI 44.64 kg/m2  SpO2 98% General appearance: alert, cooperative, appears stated age and no distress Head: Normocephalic, without obvious abnormality, atraumatic Eyes: conjunctivae/corneas clear. PERRL, EOM's intact. Fundi benign. Ears: normal TM's and external ear canals both ears Nose: Nares normal. Septum midline. Mucosa normal. No drainage or sinus tenderness. Throat: lips, mucosa, and tongue normal; teeth and gums normal Neck: no adenopathy, no carotid bruit, no JVD, supple, symmetrical, trachea midline and thyroid not enlarged, symmetric, no tenderness/mass/nodules Back: symmetric, no curvature. ROM normal. No  CVA tenderness. Lungs: clear to auscultation bilaterally Breasts: normal appearance, no masses or tenderness Heart: regular rate and rhythm, S1, S2 normal, no murmur, click, rub or gallop Abdomen: soft, non-tender; bowel sounds normal; no masses,  no organomegaly Pelvic: not indicated; status post hysterectomy, negative ROS Extremities: extremities normal, atraumatic, no cyanosis or edema Pulses: 2+ and symmetric Skin: Skin color, texture, turgor normal. No rashes or lesions Lymph nodes: Cervical, supraclavicular, and axillary nodes normal. Neurologic: Alert and oriented X 3, normal strength and tone. Normal symmetric reflexes. Normal coordination and gait Psych- no depression , no anxiety      Assessment:    Healthy female exam.       Plan:    ghm utd Check labs See After Visit Summary for Counseling Recommendations   1. Preventative health care See above - Comp Met (CMET) - CBC with Differential/Platelet - Lipid panel - Hemoglobin A1c - Microalbumin / creatinine urine ratio - POCT urinalysis dipstick - TSH - Hepatitis C antibody  2. Need for hepatitis C screening test   3. Uncontrolled type 2 diabetes mellitus without complication, without long-term current use of insulin (HCC)  - Comp Met (CMET) - CBC with  Differential/Platelet - Lipid panel - Hemoglobin A1c - Microalbumin / creatinine urine ratio - POCT urinalysis dipstick - TSH - Hepatitis C antibody - dapagliflozin propanediol (FARXIGA) 5 MG TABS tablet; Take 5 mg by mouth daily.  Dispense: 30 tablet; Refill: 2 - folic acid (FOLVITE) 1 MG tablet; TAKE 1 TABLET(1 MG) BY MOUTH DAILY  Dispense: 90 tablet; Refill: 1 - glucose blood (ONE TOUCH ULTRA TEST) test strip; Check blood sugar once daily. Dx:E11.9  Dispense: 100 each; Refill: 5 - [place]; Check blood sugar once daily. Dx:E11.9  Dispense: 100 each; Refill: 5 - SitaGLIPtin-MetFORMIN HCl 50-1000 MG TB24; Take 2 tablets by mouth daily.  Dispense: 60 tablet; Refill: 2  4. Essential hypertension   - Comp Met (CMET) - CBC with Differential/Platelet - Lipid panel - Hemoglobin A1c - Microalbumin / creatinine urine ratio - POCT urinalysis dipstick - TSH - Hepatitis C antibody  5. Hyperlipidemia LDL goal <70   - Comp Met (CMET) - Lipid panel  6. Hyperlipidemia   - LIPITOR 10 MG tablet; Take 1 tablet (10 mg total) by mouth daily.  Dispense: 90 tablet; Refill: 3  7. Generalized anxiety disorder   - escitalopram (LEXAPRO) 20 MG tablet; Take 1 tablet (20 mg total) by mouth daily.  Dispense: 90 tablet; Refill: 1  8. Screening for HIV (human immunodeficiency virus)    - HIV antibody

## 2015-12-12 NOTE — Patient Instructions (Signed)
Preventive Care for Adults, Female A healthy lifestyle and preventive care can promote health and wellness. Preventive health guidelines for women include the following key practices.  A routine yearly physical is a good way to check with your health care provider about your health and preventive screening. It is a chance to share any concerns and updates on your health and to receive a thorough exam.  Visit your dentist for a routine exam and preventive care every 6 months. Brush your teeth twice a day and floss once a day. Good oral hygiene prevents tooth decay and gum disease.  The frequency of eye exams is based on your age, health, family medical history, use of contact lenses, and other factors. Follow your health care provider's recommendations for frequency of eye exams.  Eat a healthy diet. Foods like vegetables, fruits, whole grains, low-fat dairy products, and lean protein foods contain the nutrients you need without too many calories. Decrease your intake of foods high in solid fats, added sugars, and salt. Eat the right amount of calories for you.Get information about a proper diet from your health care provider, if necessary.  Regular physical exercise is one of the most important things you can do for your health. Most adults should get at least 150 minutes of moderate-intensity exercise (any activity that increases your heart rate and causes you to sweat) each week. In addition, most adults need muscle-strengthening exercises on 2 or more days a week.  Maintain a healthy weight. The body mass index (BMI) is a screening tool to identify possible weight problems. It provides an estimate of body fat based on height and weight. Your health care provider can find your BMI and can help you achieve or maintain a healthy weight.For adults 20 years and older:  A BMI below 18.5 is considered underweight.  A BMI of 18.5 to 24.9 is normal.  A BMI of 25 to 29.9 is considered overweight.  A  BMI of 30 and above is considered obese.  Maintain normal blood lipids and cholesterol levels by exercising and minimizing your intake of saturated fat. Eat a balanced diet with plenty of fruit and vegetables. Blood tests for lipids and cholesterol should begin at age 45 and be repeated every 5 years. If your lipid or cholesterol levels are high, you are over 50, or you are at high risk for heart disease, you may need your cholesterol levels checked more frequently.Ongoing high lipid and cholesterol levels should be treated with medicines if diet and exercise are not working.  If you smoke, find out from your health care provider how to quit. If you do not use tobacco, do not start.  Lung cancer screening is recommended for adults aged 45-80 years who are at high risk for developing lung cancer because of a history of smoking. A yearly low-dose CT scan of the lungs is recommended for people who have at least a 30-pack-year history of smoking and are a current smoker or have quit within the past 15 years. A pack year of smoking is smoking an average of 1 pack of cigarettes a day for 1 year (for example: 1 pack a day for 30 years or 2 packs a day for 15 years). Yearly screening should continue until the smoker has stopped smoking for at least 15 years. Yearly screening should be stopped for people who develop a health problem that would prevent them from having lung cancer treatment.  If you are pregnant, do not drink alcohol. If you are  breastfeeding, be very cautious about drinking alcohol. If you are not pregnant and choose to drink alcohol, do not have more than 1 drink per day. One drink is considered to be 12 ounces (355 mL) of beer, 5 ounces (148 mL) of wine, or 1.5 ounces (44 mL) of liquor.  Avoid use of street drugs. Do not share needles with anyone. Ask for help if you need support or instructions about stopping the use of drugs.  High blood pressure causes heart disease and increases the risk  of stroke. Your blood pressure should be checked at least every 1 to 2 years. Ongoing high blood pressure should be treated with medicines if weight loss and exercise do not work.  If you are 55-79 years old, ask your health care provider if you should take aspirin to prevent strokes.  Diabetes screening is done by taking a blood sample to check your blood glucose level after you have not eaten for a certain period of time (fasting). If you are not overweight and you do not have risk factors for diabetes, you should be screened once every 3 years starting at age 45. If you are overweight or obese and you are 40-70 years of age, you should be screened for diabetes every year as part of your cardiovascular risk assessment.  Breast cancer screening is essential preventive care for women. You should practice "breast self-awareness." This means understanding the normal appearance and feel of your breasts and may include breast self-examination. Any changes detected, no matter how small, should be reported to a health care provider. Women in their 20s and 30s should have a clinical breast exam (CBE) by a health care provider as part of a regular health exam every 1 to 3 years. After age 40, women should have a CBE every year. Starting at age 40, women should consider having a mammogram (breast X-ray test) every year. Women who have a family history of breast cancer should talk to their health care provider about genetic screening. Women at a high risk of breast cancer should talk to their health care providers about having an MRI and a mammogram every year.  Breast cancer gene (BRCA)-related cancer risk assessment is recommended for women who have family members with BRCA-related cancers. BRCA-related cancers include breast, ovarian, tubal, and peritoneal cancers. Having family members with these cancers may be associated with an increased risk for harmful changes (mutations) in the breast cancer genes BRCA1 and  BRCA2. Results of the assessment will determine the need for genetic counseling and BRCA1 and BRCA2 testing.  Your health care provider may recommend that you be screened regularly for cancer of the pelvic organs (ovaries, uterus, and vagina). This screening involves a pelvic examination, including checking for microscopic changes to the surface of your cervix (Pap test). You may be encouraged to have this screening done every 3 years, beginning at age 21.  For women ages 30-65, health care providers may recommend pelvic exams and Pap testing every 3 years, or they may recommend the Pap and pelvic exam, combined with testing for human papilloma virus (HPV), every 5 years. Some types of HPV increase your risk of cervical cancer. Testing for HPV may also be done on women of any age with unclear Pap test results.  Other health care providers may not recommend any screening for nonpregnant women who are considered low risk for pelvic cancer and who do not have symptoms. Ask your health care provider if a screening pelvic exam is right for   you.  If you have had past treatment for cervical cancer or a condition that could lead to cancer, you need Pap tests and screening for cancer for at least 20 years after your treatment. If Pap tests have been discontinued, your risk factors (such as having a new sexual partner) need to be reassessed to determine if screening should resume. Some women have medical problems that increase the chance of getting cervical cancer. In these cases, your health care provider may recommend more frequent screening and Pap tests.  Colorectal cancer can be detected and often prevented. Most routine colorectal cancer screening begins at the age of 50 years and continues through age 75 years. However, your health care provider may recommend screening at an earlier age if you have risk factors for colon cancer. On a yearly basis, your health care provider may provide home test kits to check  for hidden blood in the stool. Use of a small camera at the end of a tube, to directly examine the colon (sigmoidoscopy or colonoscopy), can detect the earliest forms of colorectal cancer. Talk to your health care provider about this at age 50, when routine screening begins. Direct exam of the colon should be repeated every 5-10 years through age 75 years, unless early forms of precancerous polyps or small growths are found.  People who are at an increased risk for hepatitis B should be screened for this virus. You are considered at high risk for hepatitis B if:  You were born in a country where hepatitis B occurs often. Talk with your health care provider about which countries are considered high risk.  Your parents were born in a high-risk country and you have not received a shot to protect against hepatitis B (hepatitis B vaccine).  You have HIV or AIDS.  You use needles to inject street drugs.  You live with, or have sex with, someone who has hepatitis B.  You get hemodialysis treatment.  You take certain medicines for conditions like cancer, organ transplantation, and autoimmune conditions.  Hepatitis C blood testing is recommended for all people born from 1945 through 1965 and any individual with known risks for hepatitis C.  Practice safe sex. Use condoms and avoid high-risk sexual practices to reduce the spread of sexually transmitted infections (STIs). STIs include gonorrhea, chlamydia, syphilis, trichomonas, herpes, HPV, and human immunodeficiency virus (HIV). Herpes, HIV, and HPV are viral illnesses that have no cure. They can result in disability, cancer, and death.  You should be screened for sexually transmitted illnesses (STIs) including gonorrhea and chlamydia if:  You are sexually active and are younger than 24 years.  You are older than 24 years and your health care provider tells you that you are at risk for this type of infection.  Your sexual activity has changed  since you were last screened and you are at an increased risk for chlamydia or gonorrhea. Ask your health care provider if you are at risk.  If you are at risk of being infected with HIV, it is recommended that you take a prescription medicine daily to prevent HIV infection. This is called preexposure prophylaxis (PrEP). You are considered at risk if:  You are sexually active and do not regularly use condoms or know the HIV status of your partner(s).  You take drugs by injection.  You are sexually active with a partner who has HIV.  Talk with your health care provider about whether you are at high risk of being infected with HIV. If   you choose to begin PrEP, you should first be tested for HIV. You should then be tested every 3 months for as long as you are taking PrEP.  Osteoporosis is a disease in which the bones lose minerals and strength with aging. This can result in serious bone fractures or breaks. The risk of osteoporosis can be identified using a bone density scan. Women ages 67 years and over and women at risk for fractures or osteoporosis should discuss screening with their health care providers. Ask your health care provider whether you should take a calcium supplement or vitamin D to reduce the rate of osteoporosis.  Menopause can be associated with physical symptoms and risks. Hormone replacement therapy is available to decrease symptoms and risks. You should talk to your health care provider about whether hormone replacement therapy is right for you.  Use sunscreen. Apply sunscreen liberally and repeatedly throughout the day. You should seek shade when your shadow is shorter than you. Protect yourself by wearing long sleeves, pants, a wide-brimmed hat, and sunglasses year round, whenever you are outdoors.  Once a month, do a whole body skin exam, using a mirror to look at the skin on your back. Tell your health care provider of new moles, moles that have irregular borders, moles that  are larger than a pencil eraser, or moles that have changed in shape or color.  Stay current with required vaccines (immunizations).  Influenza vaccine. All adults should be immunized every year.  Tetanus, diphtheria, and acellular pertussis (Td, Tdap) vaccine. Pregnant women should receive 1 dose of Tdap vaccine during each pregnancy. The dose should be obtained regardless of the length of time since the last dose. Immunization is preferred during the 27th-36th week of gestation. An adult who has not previously received Tdap or who does not know her vaccine status should receive 1 dose of Tdap. This initial dose should be followed by tetanus and diphtheria toxoids (Td) booster doses every 10 years. Adults with an unknown or incomplete history of completing a 3-dose immunization series with Td-containing vaccines should begin or complete a primary immunization series including a Tdap dose. Adults should receive a Td booster every 10 years.  Varicella vaccine. An adult without evidence of immunity to varicella should receive 2 doses or a second dose if she has previously received 1 dose. Pregnant females who do not have evidence of immunity should receive the first dose after pregnancy. This first dose should be obtained before leaving the health care facility. The second dose should be obtained 4-8 weeks after the first dose.  Human papillomavirus (HPV) vaccine. Females aged 13-26 years who have not received the vaccine previously should obtain the 3-dose series. The vaccine is not recommended for use in pregnant females. However, pregnancy testing is not needed before receiving a dose. If a female is found to be pregnant after receiving a dose, no treatment is needed. In that case, the remaining doses should be delayed until after the pregnancy. Immunization is recommended for any person with an immunocompromised condition through the age of 61 years if she did not get any or all doses earlier. During the  3-dose series, the second dose should be obtained 4-8 weeks after the first dose. The third dose should be obtained 24 weeks after the first dose and 16 weeks after the second dose.  Zoster vaccine. One dose is recommended for adults aged 30 years or older unless certain conditions are present.  Measles, mumps, and rubella (MMR) vaccine. Adults born  before 1957 generally are considered immune to measles and mumps. Adults born in 1957 or later should have 1 or more doses of MMR vaccine unless there is a contraindication to the vaccine or there is laboratory evidence of immunity to each of the three diseases. A routine second dose of MMR vaccine should be obtained at least 28 days after the first dose for students attending postsecondary schools, health care workers, or international travelers. People who received inactivated measles vaccine or an unknown type of measles vaccine during 1963-1967 should receive 2 doses of MMR vaccine. People who received inactivated mumps vaccine or an unknown type of mumps vaccine before 1979 and are at high risk for mumps infection should consider immunization with 2 doses of MMR vaccine. For females of childbearing age, rubella immunity should be determined. If there is no evidence of immunity, females who are not pregnant should be vaccinated. If there is no evidence of immunity, females who are pregnant should delay immunization until after pregnancy. Unvaccinated health care workers born before 1957 who lack laboratory evidence of measles, mumps, or rubella immunity or laboratory confirmation of disease should consider measles and mumps immunization with 2 doses of MMR vaccine or rubella immunization with 1 dose of MMR vaccine.  Pneumococcal 13-valent conjugate (PCV13) vaccine. When indicated, a person who is uncertain of his immunization history and has no record of immunization should receive the PCV13 vaccine. All adults 65 years of age and older should receive this  vaccine. An adult aged 19 years or older who has certain medical conditions and has not been previously immunized should receive 1 dose of PCV13 vaccine. This PCV13 should be followed with a dose of pneumococcal polysaccharide (PPSV23) vaccine. Adults who are at high risk for pneumococcal disease should obtain the PPSV23 vaccine at least 8 weeks after the dose of PCV13 vaccine. Adults older than 63 years of age who have normal immune system function should obtain the PPSV23 vaccine dose at least 1 year after the dose of PCV13 vaccine.  Pneumococcal polysaccharide (PPSV23) vaccine. When PCV13 is also indicated, PCV13 should be obtained first. All adults aged 65 years and older should be immunized. An adult younger than age 65 years who has certain medical conditions should be immunized. Any person who resides in a nursing home or long-term care facility should be immunized. An adult smoker should be immunized. People with an immunocompromised condition and certain other conditions should receive both PCV13 and PPSV23 vaccines. People with human immunodeficiency virus (HIV) infection should be immunized as soon as possible after diagnosis. Immunization during chemotherapy or radiation therapy should be avoided. Routine use of PPSV23 vaccine is not recommended for American Indians, [place] Natives, or people younger than 65 years unless there are medical conditions that require PPSV23 vaccine. When indicated, people who have unknown immunization and have no record of immunization should receive PPSV23 vaccine. One-time revaccination 5 years after the first dose of PPSV23 is recommended for people aged 19-64 years who have chronic kidney failure, nephrotic syndrome, asplenia, or immunocompromised conditions. People who received 1-2 doses of PPSV23 before age 65 years should receive another dose of PPSV23 vaccine at age 65 years or later if at least 5 years have passed since the previous dose. Doses of PPSV23 are not  needed for people immunized with PPSV23 at or after age 65 years.  Meningococcal vaccine. Adults with asplenia or persistent complement component deficiencies should receive 2 doses of quadrivalent meningococcal conjugate (MenACWY-D) vaccine. The doses should be obtained   at least 2 months apart. Microbiologists working with certain meningococcal bacteria, [place] recruits, people at risk during an outbreak, and people who travel to or live in countries with a high rate of meningitis should be immunized. A first-year college student up through age 34 years who is living in a residence hall should receive a dose if she did not receive a dose on or after her 16th birthday. Adults who have certain high-risk conditions should receive one or more doses of vaccine.  Hepatitis A vaccine. Adults who wish to be protected from this disease, have certain high-risk conditions, work with hepatitis A-infected animals, work in hepatitis A research labs, or travel to or work in countries with a high rate of hepatitis A should be immunized. Adults who were previously unvaccinated and who anticipate close contact with an international adoptee during the first 60 days after arrival in the [place] from a country with a high rate of hepatitis A should be immunized.  Hepatitis B vaccine. Adults who wish to be protected from this disease, have certain high-risk conditions, may be exposed to blood or other infectious body fluids, are household contacts or sex partners of hepatitis B positive people, are clients or workers in certain care facilities, or travel to or work in countries with a high rate of hepatitis B should be immunized.  Haemophilus influenzae type b (Hib) vaccine. A previously unvaccinated person with asplenia or sickle cell disease or having a scheduled splenectomy should receive 1 dose of Hib vaccine. Regardless of previous immunization, a recipient of a hematopoietic stem cell transplant should receive a  3-dose series 6-12 months after her successful transplant. Hib vaccine is not recommended for adults with HIV infection. Preventive Services / Frequency Ages 35 to 4 years  Blood pressure check.** / Every 3-5 years.  Lipid and cholesterol check.** / Every 5 years beginning at age 60.  Clinical breast exam.** / Every 3 years for women in their 71s and 10s.  BRCA-related cancer risk assessment.** / For women who have family members with a BRCA-related cancer (breast, ovarian, tubal, or peritoneal cancers).  Pap test.** / Every 2 years from ages 76 through 26. Every 3 years starting at age 61 through age 76 or 93 with a history of 3 consecutive normal Pap tests.  HPV screening.** / Every 3 years from ages 37 through ages 60 to 51 with a history of 3 consecutive normal Pap tests.  Hepatitis C blood test.** / For any individual with known risks for hepatitis C.  Skin self-exam. / Monthly.  Influenza vaccine. / Every year.  Tetanus, diphtheria, and acellular pertussis (Tdap, Td) vaccine.** / Consult your health care provider. Pregnant women should receive 1 dose of Tdap vaccine during each pregnancy. 1 dose of Td every 10 years.  Varicella vaccine.** / Consult your health care provider. Pregnant females who do not have evidence of immunity should receive the first dose after pregnancy.  HPV vaccine. / 3 doses over 6 months, if 93 and younger. The vaccine is not recommended for use in pregnant females. However, pregnancy testing is not needed before receiving a dose.  Measles, mumps, rubella (MMR) vaccine.** / You need at least 1 dose of MMR if you were born in 1957 or later. You may also need a 2nd dose. For females of childbearing age, rubella immunity should be determined. If there is no evidence of immunity, females who are not pregnant should be vaccinated. If there is no evidence of immunity, females who are  pregnant should delay immunization until after pregnancy.  Pneumococcal  13-valent conjugate (PCV13) vaccine.** / Consult your health care provider.  Pneumococcal polysaccharide (PPSV23) vaccine.** / 1 to 2 doses if you smoke cigarettes or if you have certain conditions.  Meningococcal vaccine.** / 1 dose if you are age 68 to 8 years and a Market researcher living in a residence hall, or have one of several medical conditions, you need to get vaccinated against meningococcal disease. You may also need additional booster doses.  Hepatitis A vaccine.** / Consult your health care provider.  Hepatitis B vaccine.** / Consult your health care provider.  Haemophilus influenzae type b (Hib) vaccine.** / Consult your health care provider. Ages 7 to 53 years  Blood pressure check.** / Every year.  Lipid and cholesterol check.** / Every 5 years beginning at age 25 years.  Lung cancer screening. / Every year if you are aged 11-80 years and have a 30-pack-year history of smoking and currently smoke or have quit within the past 15 years. Yearly screening is stopped once you have quit smoking for at least 15 years or develop a health problem that would prevent you from having lung cancer treatment.  Clinical breast exam.** / Every year after age 48 years.  BRCA-related cancer risk assessment.** / For women who have family members with a BRCA-related cancer (breast, ovarian, tubal, or peritoneal cancers).  Mammogram.** / Every year beginning at age 41 years and continuing for as long as you are in good health. Consult with your health care provider.  Pap test.** / Every 3 years starting at age 65 years through age 37 or 70 years with a history of 3 consecutive normal Pap tests.  HPV screening.** / Every 3 years from ages 72 years through ages 60 to 40 years with a history of 3 consecutive normal Pap tests.  Fecal occult blood test (FOBT) of stool. / Every year beginning at age 21 years and continuing until age 5 years. You may not need to do this test if you get  a colonoscopy every 10 years.  Flexible sigmoidoscopy or colonoscopy.** / Every 5 years for a flexible sigmoidoscopy or every 10 years for a colonoscopy beginning at age 35 years and continuing until age 48 years.  Hepatitis C blood test.** / For all people born from [birth year] through 1965 and any individual with known risks for hepatitis C.  Skin self-exam. / Monthly.  Influenza vaccine. / Every year.  Tetanus, diphtheria, and acellular pertussis (Tdap/Td) vaccine.** / Consult your health care provider. Pregnant women should receive 1 dose of Tdap vaccine during each pregnancy. 1 dose of Td every 10 years.  Varicella vaccine.** / Consult your health care provider. Pregnant females who do not have evidence of immunity should receive the first dose after pregnancy.  Zoster vaccine.** / 1 dose for adults aged 30 years or older.  Measles, mumps, rubella (MMR) vaccine.** / You need at least 1 dose of MMR if you were born in 1957 or later. You may also need a second dose. For females of childbearing age, rubella immunity should be determined. If there is no evidence of immunity, females who are not pregnant should be vaccinated. If there is no evidence of immunity, females who are pregnant should delay immunization until after pregnancy.  Pneumococcal 13-valent conjugate (PCV13) vaccine.** / Consult your health care provider.  Pneumococcal polysaccharide (PPSV23) vaccine.** / 1 to 2 doses if you smoke cigarettes or if you have certain conditions.  Meningococcal vaccine.** /  Consult your health care provider.  Hepatitis A vaccine.** / Consult your health care provider.  Hepatitis B vaccine.** / Consult your health care provider.  Haemophilus influenzae type b (Hib) vaccine.** / Consult your health care provider. Ages 64 years and over  Blood pressure check.** / Every year.  Lipid and cholesterol check.** / Every 5 years beginning at age 23 years.  Lung cancer screening. / Every year if you  are aged 16-80 years and have a 30-pack-year history of smoking and currently smoke or have quit within the past 15 years. Yearly screening is stopped once you have quit smoking for at least 15 years or develop a health problem that would prevent you from having lung cancer treatment.  Clinical breast exam.** / Every year after age 74 years.  BRCA-related cancer risk assessment.** / For women who have family members with a BRCA-related cancer (breast, ovarian, tubal, or peritoneal cancers).  Mammogram.** / Every year beginning at age 44 years and continuing for as long as you are in good health. Consult with your health care provider.  Pap test.** / Every 3 years starting at age 58 years through age 22 or 39 years with 3 consecutive normal Pap tests. Testing can be stopped between 65 and 70 years with 3 consecutive normal Pap tests and no abnormal Pap or HPV tests in the past 10 years.  HPV screening.** / Every 3 years from ages 64 years through ages 70 or 61 years with a history of 3 consecutive normal Pap tests. Testing can be stopped between 65 and 70 years with 3 consecutive normal Pap tests and no abnormal Pap or HPV tests in the past 10 years.  Fecal occult blood test (FOBT) of stool. / Every year beginning at age 40 years and continuing until age 27 years. You may not need to do this test if you get a colonoscopy every 10 years.  Flexible sigmoidoscopy or colonoscopy.** / Every 5 years for a flexible sigmoidoscopy or every 10 years for a colonoscopy beginning at age 7 years and continuing until age 32 years.  Hepatitis C blood test.** / For all people born from [birth year] through 1963 and any individual with known risks for hepatitis C.  Osteoporosis screening.** / A one-time screening for women ages 30 years and over and women at risk for fractures or osteoporosis.  Skin self-exam. / Monthly.  Influenza vaccine. / Every year.  Tetanus, diphtheria, and acellular pertussis (Tdap/Td)  vaccine.** / 1 dose of Td every 10 years.  Varicella vaccine.** / Consult your health care provider.  Zoster vaccine.** / 1 dose for adults aged 35 years or older.  Pneumococcal 13-valent conjugate (PCV13) vaccine.** / Consult your health care provider.  Pneumococcal polysaccharide (PPSV23) vaccine.** / 1 dose for all adults aged 46 years and older.  Meningococcal vaccine.** / Consult your health care provider.  Hepatitis A vaccine.** / Consult your health care provider.  Hepatitis B vaccine.** / Consult your health care provider.  Haemophilus influenzae type b (Hib) vaccine.** / Consult your health care provider. ** Family history and personal history of risk and conditions may change your health care provider's recommendations.   This information is not intended to replace advice given to you by your health care provider. Make sure you discuss any questions you have with your health care provider.   Document Released: 12/17/2001 Document Revised: 11/11/2014 Document Reviewed: 03/18/2011 Elsevier Interactive Patient Education [employer].

## 2015-12-12 NOTE — Progress Notes (Signed)
Pre visit review using our clinic review tool, if applicable. No additional management support is needed unless otherwise documented below in the visit note. 

## 2015-12-12 NOTE — Telephone Encounter (Signed)
error:[id number] ° °

## 2015-12-13 LAB — HIV ANTIBODY (ROUTINE TESTING W REFLEX): HIV: NONREACTIVE

## 2015-12-13 LAB — HEPATITIS C ANTIBODY: HCV Ab: NEGATIVE

## 2015-12-15 ENCOUNTER — Other Ambulatory Visit: Payer: Self-pay | Admitting: Family Medicine

## 2015-12-15 DIAGNOSIS — E785 Hyperlipidemia, unspecified: Secondary | ICD-10-CM

## 2015-12-15 NOTE — Telephone Encounter (Signed)
Jacqueline Duran with [facility] in [employer] on [address] Ph# [phone]  PA request for Lipitor was faxed to [phone] 12/12/15. She is calling to check status. No documentation in system. She is refaxing today to [phone]. Pt has called them to f/u on med refill.

## 2015-12-18 ENCOUNTER — Telehealth: Payer: Self-pay | Admitting: *Deleted

## 2015-12-18 ENCOUNTER — Other Ambulatory Visit: Payer: Self-pay

## 2015-12-18 DIAGNOSIS — IMO0001 Reserved for inherently not codable concepts without codable children: Secondary | ICD-10-CM

## 2015-12-18 DIAGNOSIS — E1165 Type 2 diabetes mellitus with hyperglycemia: Principal | ICD-10-CM

## 2015-12-18 MED ORDER — DAPAGLIFLOZIN PROPANEDIOL 10 MG PO TABS: 10.0000 mg | ORAL_TABLET | Freq: Every day | ORAL | Status: DC

## 2015-12-18 NOTE — Telephone Encounter (Signed)
Received fax requesting PA on Lipitor/SLS 02/13

## 2015-12-28 ENCOUNTER — Encounter: Payer: Self-pay | Admitting: Family Medicine

## 2015-12-28 DIAGNOSIS — E785 Hyperlipidemia, unspecified: Secondary | ICD-10-CM

## 2015-12-28 MED ORDER — ATORVASTATIN CALCIUM 10 MG PO TABS: 10.0000 mg | ORAL_TABLET | Freq: Every day | ORAL | Status: DC

## 2015-12-28 MED ORDER — VALSARTAN-HYDROCHLOROTHIAZIDE 160-25 MG PO TABS: 1.0000 | ORAL_TABLET | Freq: Every day | ORAL | Status: DC

## 2015-12-28 MED ORDER — LINAGLIPTIN 5 MG PO TABS: ORAL_TABLET | ORAL | Status: DC

## 2015-12-28 NOTE — Telephone Encounter (Signed)
Pharmacy called wanting to change medication to generic LIPITOR 10 MG tablet insurance is not paying [phone] fax [phone]

## 2015-12-28 NOTE — Telephone Encounter (Signed)
Rx is for Brand named, is it ok to switch to generic?      KP

## 2016-01-02 ENCOUNTER — Encounter: Payer: Self-pay | Admitting: Family Medicine

## 2016-01-02 DIAGNOSIS — IMO0001 Reserved for inherently not codable concepts without codable children: Secondary | ICD-10-CM

## 2016-01-02 DIAGNOSIS — E1165 Type 2 diabetes mellitus with hyperglycemia: Principal | ICD-10-CM

## 2016-01-02 MED ORDER — SITAGLIP PHOS-METFORMIN HCL ER 50-1000 MG PO TB24: 2.0000 | ORAL_TABLET | Freq: Every day | ORAL | Status: DC

## 2016-02-24 ENCOUNTER — Ambulatory Visit: Payer: 59 | Admitting: Family Medicine

## 2016-02-28 ENCOUNTER — Telehealth: Payer: Self-pay | Admitting: Family Medicine

## 2016-02-28 ENCOUNTER — Encounter: Payer: Self-pay | Admitting: Medical

## 2016-02-28 ENCOUNTER — Ambulatory Visit (HOSPITAL_BASED_OUTPATIENT_CLINIC_OR_DEPARTMENT_OTHER)
Admission: RE | Admit: 2016-02-28 | Discharge: 2016-02-28 | Disposition: A | Discharge status: Home / Self Care | Payer: 59 | Source: Ambulatory Visit | Attending: Medical | Admitting: Medical

## 2016-02-28 ENCOUNTER — Ambulatory Visit (INDEPENDENT_AMBULATORY_CARE_PROVIDER_SITE_OTHER): Payer: 59 | Admitting: Medical

## 2016-02-28 VITALS — BP 124/78 | HR 64 | Temp 98.0°F | Resp 18 | Ht 64.0 in | Wt 263.4 lb

## 2016-02-28 DIAGNOSIS — R062 Wheezing: Secondary | ICD-10-CM | POA: Diagnosis not present

## 2016-02-28 DIAGNOSIS — R059 Cough, unspecified: Secondary | ICD-10-CM

## 2016-02-28 DIAGNOSIS — R05 Cough: Secondary | ICD-10-CM

## 2016-02-28 MED ORDER — HYDROCODONE-HOMATROPINE 5-1.5 MG/5ML PO SYRP: 5.0000 mL | ORAL_SOLUTION | Freq: Three times a day (TID) | ORAL | Status: DC | PRN

## 2016-02-28 MED ORDER — PREDNISONE 10 MG PO TABS: ORAL_TABLET | ORAL | Status: DC

## 2016-02-28 MED ORDER — ALBUTEROL SULFATE HFA 108 (90 BASE) MCG/ACT IN AERS: 2.0000 | INHALATION_SPRAY | Freq: Four times a day (QID) | RESPIRATORY_TRACT | Status: DC | PRN

## 2016-02-28 MED ORDER — FLUTICASONE PROPIONATE HFA 110 MCG/ACT IN AERO: 2.0000 | INHALATION_SPRAY | Freq: Two times a day (BID) | RESPIRATORY_TRACT | Status: DC

## 2016-02-28 NOTE — Telephone Encounter (Signed)
Pt called in because she says that she left a vm canceling appt for Saturday 02/24/16.

## 2016-02-28 NOTE — Progress Notes (Signed)
Subjective:    Patient ID: Jacqueline Duran, female    DOB: [birth year], 65 y.o.   MRN: [id number]  HPI  Pt in with some cough on and off for 3 weeks. She states last week got a zpack. Pt state NP gave her the rx. Also she took benzonatate. Has decreased her cough some but has not stopped.   Pt has coughed up some mucous. Pt has some sweats. No chills or fever. Pt states early on 3 weeks ago had some sinus pressure.   Pt has had some wheezing intermittently during the day. No wheezing in past 2 days. More in the evening.  In past has used inhalers intermittently.  Pt usually get bronchitis about one time. Usually in the spring.  Pt just moved over the weekend.  Review of Systems  Constitutional: Negative for fever, chills and fatigue.  HENT: Positive for congestion. Negative for sinus pressure.   Respiratory: Positive for cough and wheezing. Negative for chest tightness and shortness of breath.   Cardiovascular: Negative for chest pain and palpitations.  Gastrointestinal: Negative for abdominal pain.  Musculoskeletal: Negative for back pain.  Skin: Negative for rash.  Neurological: Negative for dizziness and headaches.  Hematological: Negative for adenopathy. Does not bruise/bleed easily.  Psychiatric/Behavioral: Negative for behavioral problems and confusion.    Past Medical History  Diagnosis Date  . Hypertension   . Hyperlipidemia   . Deafness     Right Ear  . Diabetes mellitus   . Colon polyps      Social History   Social History  . Marital Status: Married    Spouse Name: N/A  . Number of Children: N/A  . Years of Education: N/A   Occupational History  . unemployed    Social History Main Topics  . Smoking status: Never Smoker   . Smokeless tobacco: Never Used  . Alcohol Use: No  . Drug Use: No  . Sexual Activity:    Partners: Male   Other Topics Concern  . Not on file   Social History Narrative   Husband dx with dementia 12/2011    Past Surgical  History  Procedure Laterality Date  . Abdominal hysterectomy    . Tonsillectomy    . Cervical fusion  07/2004  . Colonoscopy  8/11    Hyperplastic polyps    Family History  Problem Relation Age of Onset  . Lung cancer Father   . Cancer Father     LUNG  . Heart attack    . Clotting disorder Sister     Factor V  . Alcohol abuse    . Coronary artery disease Mother   . Stroke      Female <50/Female <60  . Leukemia Maternal Uncle   . Heart disease Other   . Factor V Leiden deficiency Other     THROMBOEBOLISM CLOTTING DISORDER  . Alcohol abuse Other     Allergies  Allergen Reactions  . Amlodipine Besylate     REACTION: swelling in feet and ankles/rash/pain  . Other Itching    INVOKANA    Current Outpatient Prescriptions on File Prior to Visit  Medication Sig Dispense Refill  . ascorbic acid (VITAMIN C) 500 MG tablet Take 1,000 mg by mouth daily.     Marland Kitchen aspirin 81 MG tablet Take 81 mg by mouth daily.      Marland Kitchen atorvastatin (LIPITOR) 10 MG tablet Take 1 tablet (10 mg total) by mouth daily. 90 tablet 1  . dapagliflozin propanediol (  FARXIGA) 10 MG TABS tablet Take 10 mg by mouth daily. 30 tablet 2  . escitalopram (LEXAPRO) 20 MG tablet Take 1 tablet (20 mg total) by mouth daily. 90 tablet 1  . folic acid (FOLVITE) 1 MG tablet TAKE 1 TABLET(1 MG) BY MOUTH DAILY 90 tablet 1  . glucose blood (ONE TOUCH ULTRA TEST) test strip Check blood sugar once daily. Dx:E11.9 100 each 5  . Lactobacillus (PROBIOTIC ACIDOPHILUS PO) Take by mouth.    . linagliptin (TRADJENTA) 5 MG TABS tablet TAKE 1 TABLET(5 MG) BY MOUTH DAILY 90 tablet 1  . Magnesium 500 MG TABS Take 1 tablet by mouth daily.    Marland Kitchen MEGARED OMEGA-3 KRILL OIL 500 MG CAPS Take 1 capsule by mouth daily.    . Multiple Vitamin (MULTIVITAMIN) tablet Take 1 tablet by mouth daily.      [name] DELICA LANCETS FINE MISC Check blood sugar once daily. Dx:E11.9 100 each 5  . SitaGLIPtin-MetFORMIN HCl 50-1000 MG TB24 Take 2 tablets by mouth daily.  180 tablet 1  . Turmeric 500 MG CAPS Take 1 capsule by mouth daily.    . valsartan-hydrochlorothiazide (DIOVAN-HCT) 160-25 MG tablet Take 1 tablet by mouth daily. 90 tablet 1  . vitamin E 400 UNIT capsule Take 400 Units by mouth daily.     No current facility-administered medications on file prior to visit.    BP 124/78 mmHg  Pulse 64  Temp(Src) 98 F (36.7 C) (Oral)  Resp 18  Ht 5\' 4"  (1.626 m)  Wt 263 lb 6.4 oz (119.477 kg)  BMI 45.19 kg/m2  SpO2 93%       Objective:   Physical Exam  General  Mental Status - Alert. General Appearance - Well groomed. Not in acute distress.  Skin Rashes- No Rashes.  HEENT Head- Normal. Ear Auditory Canal - Left- Normal. Right - Normal.Tympanic Membrane- Left- Normal. Right- Normal. Eye Sclera/Conjunctiva- Left- Normal. Right- Normal. Nose & Sinuses Nasal Mucosa- Left-  Boggy and Congested. Right-  Boggy and  Congested.Bilateral maxillary and frontal sinus pressure. Mouth & Throat Lips: Upper Lip- Normal: no dryness, cracking, pallor, cyanosis, or vesicular eruption. Lower Lip-Normal: no dryness, cracking, pallor, cyanosis or vesicular eruption. Buccal Mucosa- Bilateral- No Aphthous ulcers. Oropharynx- No Discharge or Erythema. Tonsils: Characteristics- Bilateral- No Erythema or Congestion. Size/Enlargement- Bilateral- No enlargement. Discharge- bilateral-None.  Neck Neck- Supple. No Masses.   Chest and Lung Exam Auscultation: Breath Sounds:-Clear even and unlabored.  Cardiovascular Auscultation:Rythm- Regular, rate and rhythm. Murmurs & Other Heart Sounds:Ausculatation of the heart reveal- No Murmurs.  Lymphatic Head & Neck General Head & Neck Lymphatics: Bilateral: Description- No Localized lymphadenopathy.       Assessment & Plan:  For your cough and wheezing. I will rx hycodan to use at night. For wheezing rx of flovent  inhaler and albuterol inhaler.   If you are worsening with wheezing by Friday or over weekend  then start low dose taper prednisone. But I would want you to monitor sugars closely while on prednisone if you have to use.  Also will get cxr today. Not convinced you need another round not of antibiotic just yet. Will follow chest xray and see how you do clinically. May rx another antibiotic if indicated.  Follow up in 7 days or as needed

## 2016-02-28 NOTE — Progress Notes (Signed)
Pre visit review using our clinic review tool, if applicable. No additional management support is needed unless otherwise documented below in the visit note. 

## 2016-02-28 NOTE — Patient Instructions (Signed)
For your cough and wheezing. I will rx hycodan to use at night. For wheezing rx of flovent  inhaler and albuterol inhaler.   If you are worsening with wheezing by Friday or over weekend then start low dose taper prednisone. But I would want you to monitor sugars closely while on prednisone if you have to use.  Also will get cxr today. Not convinced you need another round not of antibiotic just yet. Will follow chest xray and see how you do clinically. May rx another antibiotic if indicated.  Follow up in 7 days or as needed

## 2016-03-05 ENCOUNTER — Other Ambulatory Visit: Payer: Self-pay

## 2016-03-05 MED ORDER — DAPAGLIFLOZIN PROPANEDIOL 10 MG PO TABS: 10.0000 mg | ORAL_TABLET | Freq: Every day | ORAL | Status: DC

## 2016-03-15 ENCOUNTER — Ambulatory Visit (INDEPENDENT_AMBULATORY_CARE_PROVIDER_SITE_OTHER): Payer: 59 | Admitting: Family Medicine

## 2016-03-15 ENCOUNTER — Encounter: Payer: Self-pay | Admitting: Family Medicine

## 2016-03-15 VITALS — BP 118/68 | HR 69 | Temp 99.0°F | Ht 64.0 in | Wt 254.6 lb

## 2016-03-15 DIAGNOSIS — F411 Generalized anxiety disorder: Secondary | ICD-10-CM

## 2016-03-15 DIAGNOSIS — G4719 Other hypersomnia: Secondary | ICD-10-CM | POA: Diagnosis not present

## 2016-03-15 DIAGNOSIS — R0683 Snoring: Secondary | ICD-10-CM

## 2016-03-15 DIAGNOSIS — I10 Essential (primary) hypertension: Secondary | ICD-10-CM

## 2016-03-15 DIAGNOSIS — E1165 Type 2 diabetes mellitus with hyperglycemia: Secondary | ICD-10-CM | POA: Diagnosis not present

## 2016-03-15 DIAGNOSIS — E785 Hyperlipidemia, unspecified: Secondary | ICD-10-CM

## 2016-03-15 DIAGNOSIS — IMO0001 Reserved for inherently not codable concepts without codable children: Secondary | ICD-10-CM

## 2016-03-15 LAB — CBC WITH DIFFERENTIAL/PLATELET
BASOS PCT: 0.4 % (ref 0.0–3.0)
Basophils Absolute: 0 10*3/uL (ref 0.0–0.1)
EOS ABS: 0.2 10*3/uL (ref 0.0–0.7)
Eosinophils Relative: 2.1 % (ref 0.0–5.0)
HCT: 43.6 % (ref 36.0–46.0)
Hemoglobin: 14.2 g/dL (ref 12.0–15.0)
Lymphocytes Relative: 20.5 % (ref 12.0–46.0)
Lymphs Abs: 2.4 10*3/uL (ref 0.7–4.0)
MCHC: 32.6 g/dL (ref 30.0–36.0)
MCV: 91.4 fl (ref 78.0–100.0)
MONO ABS: 0.7 10*3/uL (ref 0.1–1.0)
Monocytes Relative: 5.8 % (ref 3.0–12.0)
NEUTROS ABS: 8.3 10*3/uL — AB (ref 1.4–7.7)
NEUTROS PCT: 71.2 % (ref 43.0–77.0)
PLATELETS: 148 10*3/uL — AB (ref 150.0–400.0)
RBC: 4.77 Mil/uL (ref 3.87–5.11)
RDW: 13.5 % (ref 11.5–15.5)
WBC: 11.6 10*3/uL — AB (ref 4.0–10.5)

## 2016-03-15 LAB — POCT URINALYSIS DIPSTICK
Bilirubin, UA: NEGATIVE
Blood, UA: NEGATIVE
Glucose, UA: 1000
Ketones, UA: NEGATIVE
LEUKOCYTES UA: NEGATIVE
Nitrite, UA: NEGATIVE
PROTEIN UA: NEGATIVE
Spec Grav, UA: >=1.03
UROBILINOGEN UA: 0.2
pH, UA: 6

## 2016-03-15 LAB — COMPREHENSIVE METABOLIC PANEL
ALBUMIN: 3.9 g/dL (ref 3.5–5.2)
ALT: 14 U/L (ref 0–35)
AST: 16 U/L (ref 0–37)
Alkaline Phosphatase: 94 U/L (ref 39–117)
BUN: 13 mg/dL (ref 6–23)
CHLORIDE: 103 meq/L (ref 96–112)
CO2: 31 meq/L (ref 19–32)
CREATININE: 0.75 mg/dL (ref 0.40–1.20)
Calcium: 9.3 mg/dL (ref 8.4–10.5)
GFR: 83.07 mL/min (ref >60.00)
Glucose, Bld: 135 mg/dL — ABNORMAL HIGH (ref 70–99)
Potassium: 4.1 mEq/L (ref 3.5–5.1)
SODIUM: 142 meq/L (ref 135–145)
Total Bilirubin: 0.7 mg/dL (ref 0.2–1.2)
Total Protein: 7.2 g/dL (ref 6.0–8.3)

## 2016-03-15 LAB — LIPID PANEL
CHOLESTEROL: 108 mg/dL (ref 0–200)
HDL: 37 mg/dL — ABNORMAL LOW (ref >39.00)
LDL Cholesterol: 47 mg/dL (ref 0–99)
NonHDL: 70.95
Total CHOL/HDL Ratio: 3
Triglycerides: 119 mg/dL (ref 0.0–149.0)
VLDL: 23.8 mg/dL (ref 0.0–40.0)

## 2016-03-15 LAB — HEMOGLOBIN A1C: HEMOGLOBIN A1C: 7.2 % — AB (ref 4.6–6.5)

## 2016-03-15 LAB — MICROALBUMIN / CREATININE URINE RATIO
Creatinine,U: 162.2 mg/dL
MICROALB/CREAT RATIO: 0.4 mg/g (ref 0.0–30.0)

## 2016-03-15 MED ORDER — ATORVASTATIN CALCIUM 10 MG PO TABS: 10.0000 mg | ORAL_TABLET | Freq: Every day | ORAL | Status: DC

## 2016-03-15 MED ORDER — ESCITALOPRAM OXALATE 20 MG PO TABS: 20.0000 mg | ORAL_TABLET | Freq: Every day | ORAL | Status: DC

## 2016-03-15 MED ORDER — LINAGLIPTIN 5 MG PO TABS: ORAL_TABLET | ORAL | Status: DC

## 2016-03-15 MED ORDER — VALSARTAN-HYDROCHLOROTHIAZIDE 160-25 MG PO TABS: 1.0000 | ORAL_TABLET | Freq: Every day | ORAL | Status: DC

## 2016-03-15 MED ORDER — SITAGLIP PHOS-METFORMIN HCL ER 50-1000 MG PO TB24: 2.0000 | ORAL_TABLET | Freq: Every day | ORAL | Status: DC

## 2016-03-15 MED ORDER — DAPAGLIFLOZIN PROPANEDIOL 10 MG PO TABS: 10.0000 mg | ORAL_TABLET | Freq: Every day | ORAL | Status: DC

## 2016-03-15 NOTE — Patient Instructions (Signed)

## 2016-03-15 NOTE — Progress Notes (Signed)
Pre visit review using our clinic review tool, if applicable. No additional management support is needed unless otherwise documented below in the visit note. 

## 2016-03-15 NOTE — Progress Notes (Signed)
Patient ID: Jacqueline Duran, female    DOB: 1951-05-06  Age: 65 y.o. MRN: [id number]    Subjective:  Subjective HPI Jacqueline Duran presents for f/u dm , cholesterol and hyperlipidemia.  No complaints.    HPI HYPERTENSION  Blood pressure range-not checking  Chest pain- no      Dyspnea- no Lightheadedness- no   Edema- no Other side effects - no   Medication compliance: good Low salt diet- yes  DIABETES  Blood Sugar ranges-not checking  Polyuria- no New Visual problems- no Hypoglycemic symptoms- no Other side effects-no Medication compliance - good Last eye exam- dec 2016 Foot exam- today  HYPERLIPIDEMIA  Medication compliance- good RUQ pain- no  Muscle aches- no Other side effects-no   Review of Systems  Constitutional: Negative for diaphoresis, appetite change, fatigue and unexpected weight change.  Eyes: Negative for pain, redness and visual disturbance.  Respiratory: Negative for cough, chest tightness, shortness of breath and wheezing.   Cardiovascular: Negative for chest pain, palpitations and leg swelling.  Endocrine: Negative for cold intolerance, heat intolerance, polydipsia, polyphagia and polyuria.  Genitourinary: Negative for dysuria, frequency and difficulty urinating.  Neurological: Negative for dizziness, light-headedness, numbness and headaches.    History Past Medical History  Diagnosis Date  . Hypertension   . Hyperlipidemia   . Deafness     Right Ear  . Diabetes mellitus   . Colon polyps     She has past surgical history that includes Abdominal hysterectomy; Tonsillectomy; Cervical fusion (07/2004); and Colonoscopy (8/11).   Her family history includes Alcohol abuse in her other; Cancer in her father; Clotting disorder in her sister; Coronary artery disease in her mother; Factor V Leiden deficiency in her other; Heart disease in her other; Leukemia in her maternal uncle; Lung cancer in her father.She reports that she has never smoked. She has  never used smokeless tobacco. She reports that she does not drink alcohol or use illicit drugs.  Current Outpatient Prescriptions on File Prior to Visit  Medication Sig Dispense Refill  . albuterol (PROVENTIL HFA;VENTOLIN HFA) 108 (90 Base) MCG/ACT inhaler Inhale 2 puffs into the lungs every 6 (six) hours as needed for wheezing or shortness of breath. 1 Inhaler 2  . ascorbic acid (VITAMIN C) 500 MG tablet Take 1,000 mg by mouth daily.     Marland Kitchen aspirin 81 MG tablet Take 81 mg by mouth daily.      . fluticasone (FLOVENT HFA) 110 MCG/ACT inhaler Inhale 2 puffs into the lungs 2 (two) times daily. 1 Inhaler 0  . folic acid (FOLVITE) 1 MG tablet TAKE 1 TABLET(1 MG) BY MOUTH DAILY 90 tablet 1  . glucose blood (ONE TOUCH ULTRA TEST) test strip Check blood sugar once daily. Dx:E11.9 100 each 5  . Lactobacillus (PROBIOTIC ACIDOPHILUS PO) Take by mouth.    . Magnesium 500 MG TABS Take 1 tablet by mouth daily.    Marland Kitchen MEGARED OMEGA-3 KRILL OIL 500 MG CAPS Take 1 capsule by mouth daily.    . Multiple Vitamin (MULTIVITAMIN) tablet Take 1 tablet by mouth daily.      Jacqueline Duran DELICA LANCETS FINE MISC Check blood sugar once daily. Dx:E11.9 100 each 5  . Turmeric 500 MG CAPS Take 1 capsule by mouth daily.    . vitamin E 400 UNIT capsule Take 400 Units by mouth daily.     No current facility-administered medications on file prior to visit.     Objective:  Objective Physical Exam  Constitutional: She is  oriented to person, place, and time. She appears well-developed and well-nourished.  HENT:  Head: Normocephalic and atraumatic.  Eyes: Conjunctivae and EOM are normal.  Neck: Normal range of motion. Neck supple. No JVD present. Carotid bruit is not present. No thyromegaly present.  Cardiovascular: Normal rate, regular rhythm and normal heart sounds.   No murmur heard. Pulmonary/Chest: Effort normal and breath sounds normal. No respiratory distress. She has no wheezes. She has no rales. She exhibits no  tenderness.  Musculoskeletal: She exhibits no edema.  Neurological: She is alert and oriented to person, place, and time.  Psychiatric: She has a normal mood and affect. Her behavior is normal. Judgment and thought content normal.  Nursing note and vitals reviewed.  BP 118/68 mmHg  Pulse 69  Temp(Src) 99 F (37.2 C) (Oral)  Ht 5\' 4"  (1.626 m)  Wt 254 lb 9.6 oz (115.486 kg)  BMI 43.68 kg/m2  SpO2 95% Wt Readings from Last 3 Encounters:  03/15/16 254 lb 9.6 oz (115.486 kg)  02/28/16 263 lb 6.4 oz (119.477 kg)  12/12/15 260 lb 3.2 oz (118.026 kg)     Lab Results  Component Value Date   WBC 15.3* 12/12/2015   HGB 14.9 12/12/2015   HCT 45.9 12/12/2015   PLT 207.0 12/12/2015   GLUCOSE 109* 12/12/2015   CHOL 124 12/12/2015   TRIG 162.0* 12/12/2015   HDL 43.20 12/12/2015   LDLCALC 48 12/12/2015   ALT 15 12/12/2015   AST 19 12/12/2015   NA 140 12/12/2015   K 3.9 12/12/2015   CL 100 12/12/2015   CREATININE 0.65 12/12/2015   BUN 12 12/12/2015   CO2 35* 12/12/2015   TSH 1.61 12/12/2015   HGBA1C 7.3* 12/12/2015   MICROALBUR <0.7 12/12/2015    Dg Chest 2 View  02/28/2016  CLINICAL DATA:  Productive cough for 3 weeks. Shortness of breath. Wheezing. EXAM: CHEST  2 VIEW COMPARISON:  06/08/2004 FINDINGS: The heart size and mediastinal contours are within normal limits. Both lungs are clear. No evidence of pneumothorax or pleural effusion. Cervical spine fusion hardware noted. IMPRESSION: No active cardiopulmonary disease. Electronically Signed   By: Earle Gell M.D.   On: 02/28/2016 16:42     Assessment & Plan:  Plan I have discontinued Jacqueline Duran's linagliptin, benzonatate, HYDROcodone-homatropine, predniSONE, and linagliptin. I am also having her maintain her ascorbic acid, aspirin, multivitamin, vitamin E, Magnesium, MEGARED OMEGA-3 KRILL OIL, Lactobacillus (PROBIOTIC ACIDOPHILUS PO), Turmeric, folic acid, glucose blood, ONETOUCH DELICA LANCETS FINE, fluticasone, albuterol,  escitalopram, atorvastatin, SitaGLIPtin-MetFORMIN HCl, valsartan-hydrochlorothiazide, and dapagliflozin propanediol.  Meds ordered this encounter  Medications  . escitalopram (LEXAPRO) 20 MG tablet    Sig: Take 1 tablet (20 mg total) by mouth daily.    Dispense:  90 tablet    Refill:  1  . atorvastatin (LIPITOR) 10 MG tablet    Sig: Take 1 tablet (10 mg total) by mouth daily.    Dispense:  90 tablet    Refill:  1    D/C PREVIOUS SCRIPTS FOR THIS MEDICATION  . SitaGLIPtin-MetFORMIN HCl 50-1000 MG TB24    Sig: Take 2 tablets by mouth daily.    Dispense:  180 tablet    Refill:  1  . valsartan-hydrochlorothiazide (DIOVAN-HCT) 160-25 MG tablet    Sig: Take 1 tablet by mouth daily.    Dispense:  90 tablet    Refill:  1  . DISCONTD: linagliptin (TRADJENTA) 5 MG TABS tablet    Sig: TAKE 1 TABLET(5 MG) BY MOUTH DAILY  Dispense:  90 tablet    Refill:  1  . dapagliflozin propanediol (FARXIGA) 10 MG TABS tablet    Sig: Take 10 mg by mouth daily.    Dispense:  90 tablet    Refill:  1    New Rx based on recent labs. Please call the patient when ready.    Problem List Items Addressed This Visit      Unprioritized   Essential hypertension    Stable con't meds      Relevant Medications   atorvastatin (LIPITOR) 10 MG tablet   valsartan-hydrochlorothiazide (DIOVAN-HCT) 160-25 MG tablet   Other Relevant Orders   Comprehensive metabolic panel   CBC with Differential/Platelet   Hemoglobin A1c   Lipid panel   POCT urinalysis dipstick (Completed)   Microalbumin / creatinine urine ratio   Comprehensive metabolic panel    Other Visit Diagnoses    Snoring    -  Primary    Relevant Orders    Ambulatory referral to Neurology    Excessive daytime sleepiness        Relevant Orders    Ambulatory referral to Neurology    Comprehensive metabolic panel    CBC with Differential/Platelet    Hemoglobin A1c    Lipid panel    POCT urinalysis dipstick (Completed)    Microalbumin / creatinine  urine ratio    Comprehensive metabolic panel    Generalized anxiety disorder        Relevant Medications    escitalopram (LEXAPRO) 20 MG tablet    Other Relevant Orders    Comprehensive metabolic panel    CBC with Differential/Platelet    Hemoglobin A1c    Lipid panel    POCT urinalysis dipstick (Completed)    Microalbumin / creatinine urine ratio    Comprehensive metabolic panel    Hyperlipidemia        Relevant Medications    atorvastatin (LIPITOR) 10 MG tablet    valsartan-hydrochlorothiazide (DIOVAN-HCT) 160-25 MG tablet    Other Relevant Orders    Comprehensive metabolic panel    CBC with Differential/Platelet    Hemoglobin A1c    Lipid panel    POCT urinalysis dipstick (Completed)    Microalbumin / creatinine urine ratio    Comprehensive metabolic panel    Uncontrolled type 2 diabetes mellitus without complication, without long-term current use of insulin (HCC)        Relevant Medications    atorvastatin (LIPITOR) 10 MG tablet    SitaGLIPtin-MetFORMIN HCl 50-1000 MG TB24    valsartan-hydrochlorothiazide (DIOVAN-HCT) 160-25 MG tablet    dapagliflozin propanediol (FARXIGA) 10 MG TABS tablet    Other Relevant Orders    Comprehensive metabolic panel    CBC with Differential/Platelet    Hemoglobin A1c    Lipid panel    POCT urinalysis dipstick (Completed)    Microalbumin / creatinine urine ratio    Comprehensive metabolic panel       Follow-up: Return in about 6 months (around 09/15/2016), or if symptoms worsen or fail to improve, for hypertension, hyperlipidemia, diabetes II.  [name], DO

## 2016-03-15 NOTE — Assessment & Plan Note (Signed)
Stable con't meds 

## 2016-03-25 ENCOUNTER — Ambulatory Visit (INDEPENDENT_AMBULATORY_CARE_PROVIDER_SITE_OTHER): Payer: 59 | Admitting: Neurology

## 2016-03-25 ENCOUNTER — Encounter: Payer: Self-pay | Admitting: *Deleted

## 2016-03-25 ENCOUNTER — Encounter: Payer: Self-pay | Admitting: Neurology

## 2016-03-25 VITALS — BP 130/70 | HR 70 | Resp 20 | Ht 64.0 in | Wt 253.0 lb

## 2016-03-25 DIAGNOSIS — R0683 Snoring: Secondary | ICD-10-CM | POA: Diagnosis not present

## 2016-03-25 DIAGNOSIS — G4730 Sleep apnea, unspecified: Secondary | ICD-10-CM | POA: Diagnosis not present

## 2016-03-25 DIAGNOSIS — Z87898 Personal history of other specified conditions: Secondary | ICD-10-CM | POA: Diagnosis not present

## 2016-03-25 DIAGNOSIS — E662 Morbid (severe) obesity with alveolar hypoventilation: Secondary | ICD-10-CM | POA: Diagnosis not present

## 2016-03-25 DIAGNOSIS — G4710 Hypersomnia, unspecified: Secondary | ICD-10-CM

## 2016-03-25 NOTE — Patient Instructions (Signed)

## 2016-03-25 NOTE — Progress Notes (Signed)
SLEEP MEDICINE CLINIC   Provider:  Larey Seat, M D  Referring Provider: Carollee Herter, Alferd Apa, * Primary Care Physician:  [name], DO  Chief Complaint  Patient presents with  . New Patient (Initial Visit)    snores, never had sleep study, stops breathing, rm 10, alone    HPI:  Jacqueline Duran is a 65 y.o. female, seen here as a referral from Dr. Etter Sjogren for a sleep evaluation.   Chief complaint according to patient : Jacqueline Duran is now deceased husband has told her that she "" shock the house "when sleeping at night. He described as a thunderous snorer and had also witnessed apneas. Her primary care physician Garnet Koyanagi, D.O. referred her partially because of this report but also because she has some past medical history that constitutes comorbidity with obstructive sleep apnea.  This includes diabetes mellitus, hyperlipidemia and hypertension. Morbid obesity. She gained weight with each pregnancy. She had very large babies, both over 9 pounds. She reports that she is 4-5 times each night up out of sleep to go to the bathroom. She also feels fatigued and sleepy in daytime. 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 She can work anytime between 6 AM and 10 PM.   Sleep habits are as follows: Some evening she will dose of on the couch but her proper bedtime begins at about 11:30 after the late night news. She usually falls asleep right after 11:30 PM, but she will be up several times due to urinary urge. She also feels a dry mouth when she wakes up. She seems to mouth breathe in her sleep. Her bedroom is cool, but she sleeps all night with a TV sound at the background. She states that when the TV is off she wakes up. She has been sleeping with a TV in the background 43 years. She slept 3 alarms to be ready in the morning 5.30- 6.00 AM . Some mornings she feels refreshed  from others not so. She wakes up frequently with morning headaches, a global and throbbing sensation ,not associated with nausea, palpitations, chest pain or shortness of breath. On the weekends she will sleep in later into the day , she will wake up spontaneously at 9 AM. During the workweek she will get only about  5 hours of sleep at night and sometimes less. She has anecdotally reported that she once fell asleep on the commode.  Sleep medical history and family sleep history: father was a loud snorer.  Social history:  Widowed, husband died of Alzheimer's, his mother had it ( fronto temporal ).  ETOH - rarely, last drink 2 years ago. Tobacco use -none. Caffeine: She sleeps with a diet pepsi on the bed stand. Double shot coffees in AM,   Only in restaurants -Iced tea , not longer at home .  Her daughters are 36 and 58, 2 stepchildren 82 and 36. 10 grandchildren, 8 great grands.   Review of Systems: Out of a complete 14 system review, the patient complains of only the following symptoms, and all other reviewed systems are negative. Fatigue severity score is 47 and Epworth sleepiness score is 15, geriatric depression score is endorsed at one out of 15 points    Social History   Social History  . Marital Status: Married    Spouse Name: N/A  . Number of Children: N/A  .  Years of Education: N/A   Occupational History  . unemployed    Social History Main Topics  . Smoking status: Never Smoker   . Smokeless tobacco: Never Used  . Alcohol Use: No  . Drug Use: No  . Sexual Activity:    Partners: Male   Other Topics Concern  . Not on file   Social History Narrative   Husband dx with dementia 12/2011    Family History  Problem Relation Age of Onset  . Lung cancer Father   . Cancer Father     LUNG  . Heart attack    . Clotting disorder Sister     Factor V  . Alcohol abuse    . Coronary artery disease Mother   . Stroke      Female <50/Female <60  . Leukemia Maternal Uncle     . Heart disease Other   . Factor V Leiden deficiency Other     THROMBOEBOLISM CLOTTING DISORDER  . Alcohol abuse Other     Past Medical History  Diagnosis Date  . Hypertension   . Hyperlipidemia   . Deafness     Right Ear  . Diabetes mellitus   . Colon polyps     Past Surgical History  Procedure Laterality Date  . Abdominal hysterectomy    . Tonsillectomy    . Cervical fusion  07/2004  . Colonoscopy  8/11    Hyperplastic polyps    Current Outpatient Prescriptions  Medication Sig Dispense Refill  . albuterol (PROVENTIL HFA;VENTOLIN HFA) 108 (90 Base) MCG/ACT inhaler Inhale 2 puffs into the lungs every 6 (six) hours as needed for wheezing or shortness of breath. 1 Inhaler 2  . ascorbic acid (VITAMIN C) 500 MG tablet Take 1,000 mg by mouth daily.     Marland Kitchen aspirin 81 MG tablet Take 81 mg by mouth daily.      Marland Kitchen atorvastatin (LIPITOR) 10 MG tablet Take 1 tablet (10 mg total) by mouth daily. 90 tablet 1  . dapagliflozin propanediol (FARXIGA) 10 MG TABS tablet Take 10 mg by mouth daily. 90 tablet 1  . escitalopram (LEXAPRO) 20 MG tablet Take 1 tablet (20 mg total) by mouth daily. 90 tablet 1  . fluticasone (FLOVENT HFA) 110 MCG/ACT inhaler Inhale 2 puffs into the lungs 2 (two) times daily. 1 Inhaler 0  . folic acid (FOLVITE) 1 MG tablet TAKE 1 TABLET(1 MG) BY MOUTH DAILY 90 tablet 1  . glucose blood (ONE TOUCH ULTRA TEST) test strip Check blood sugar once daily. Dx:E11.9 100 each 5  . Lactobacillus (PROBIOTIC ACIDOPHILUS PO) Take by mouth.    . Magnesium 500 MG TABS Take 1 tablet by mouth daily.    Marland Kitchen MEGARED OMEGA-3 KRILL OIL 500 MG CAPS Take 1 capsule by mouth daily.    . Multiple Vitamin (MULTIVITAMIN) tablet Take 1 tablet by mouth daily.      [name] DELICA LANCETS FINE MISC Check blood sugar once daily. Dx:E11.9 100 each 5  . SitaGLIPtin-MetFORMIN HCl 50-1000 MG TB24 Take 2 tablets by mouth daily. 180 tablet 1  . Turmeric 500 MG CAPS Take 1 capsule by mouth daily.    .  valsartan-hydrochlorothiazide (DIOVAN-HCT) 160-25 MG tablet Take 1 tablet by mouth daily. 90 tablet 1  . vitamin E 400 UNIT capsule Take 400 Units by mouth daily.     No current facility-administered medications for this visit.    Allergies as of 03/25/2016 - Review Complete 03/25/2016  Allergen Reaction Noted  . Amlodipine  besylate  05/26/2009  . Other Itching 04/21/2015    Vitals: BP 130/70 mmHg  Pulse 70  Resp 20  Ht 5\' 4"  (1.626 m)  Wt 253 lb (114.76 kg)  BMI 43.41 kg/m2 Last Weight:  Wt Readings from Last 1 Encounters:  03/25/16 253 lb (114.76 kg)   [id number] mass index is 43.41 kg/(m^2).     Last Height:   Ht Readings from Last 1 Encounters:  03/25/16 5\' 4"  (1.626 m)    Physical exam:  General: The patient is awake, alert and appears not in acute distress. The patient is well groomed. Head: Normocephalic, atraumatic. Neck is supple. Mallampati 5,  neck circumference:17. 5 . Nasal airflow intact  evident . Retrognathia is seen.  Patient is status post tonsillectomy, she had an anterior cervical fusion with a well-healed scar on the left. Cardiovascular:  Regular rate and rhythm*, without  murmurs or carotid bruit, and without distended neck veins. Respiratory: Lungs are clear to auscultation. Skin:  Without evidence of edema, or rash Trunk: BMI is elevated - 44. The patient's posture is erect   Neurologic exam : The patient is awake and alert, oriented to place and time.   Memory subjective described as intact.  Attention span & concentration ability appears normal.  Speech is fluent,  Without  dysarthria, dysphonia or aphasia.  Mood and affect are appropriate.  Cranial nerves: Pupils are equal and briskly reactive to light. Funduscopic exam without evidence of pallor or edema.  Extraocular movements in vertical and horizontal planes intact and without nystagmus. Visual fields by finger perimetry are intact. Hearing to finger rub intact. Facial sensation intact to  fine touch. Facial motor strength is symmetric and tongue and uvula move midline. Shoulder shrug was symmetrical.   Motor exam:   Normal tone, muscle bulk and symmetric strength in all extremities.  Sensory:  Fine touch, pinprick and vibration, Proprioception normal.  Coordination: Rapid alternating movements in the fingers/hands was normal.  Finger-to-nose maneuver  normal without evidence of ataxia, dysmetria or tremor.  Gait and station: Patient walks without assistive device and is able unassisted to climb up to the exam table. Strength within normal limits.  Stance is stable and normal.  Deep tendon reflexes: in the  upper and lower extremities are symmetric and intact.   The patient was advised of the nature of the diagnosed sleep disorder , the treatment options and risks for general a health and wellness arising from not treating the condition.  I spent more than 50 minutes of face to face time with the patient. Greater than 50% of time was spent in counseling and coordination of care. We have discussed the diagnosis and differential and I answered the patient's questions.     Assessment:  After physical and neurologic examination, review of laboratory studies,  Personal review of imaging studies, reports of other /same  Imaging studies ,  Results of polysomnography/ neurophysiology testing and pre-existing records as far as provided in visit., my assessment is   1) Mrs. Doose has a high risk of obstructive sleep apnea based on her body mass index of 44, a very high-grade Mallampati- even status after tonsillectomy. Her uvula is not visible above the tongue ground, neck circumference is exceeding 16 inches. She also reports that she was told she snores and this has been going on for at least a decade if not longer, and she has frequent nocturia. She is actually: Sleep deprived given that she only sleeps between 5 hours and 6 hours  on most nights. She endorsed the Epworth sleepiness score at  what  was hypersomnia at 15 out of 24 points .   2)The patient suffers from winter bronchitis, but has no history of COPD, asthma or sarcoid .  3) I will order a split night polysomnography for this patient who also wakes up with nocturnal headaches, therefore I will order capnography. I will ask our attending technologists to use oxygen should the patient developed severe hypoxemia in the absence of apnea. We would then titrated to oxygen only.     Plan:  Treatment plan and additional workup :  SPLIT with CO2. RV after sleep study.     I reviewed the patient's medication list she does take Lexapro, albuterol, Flovent, nutritional supplements and vitamins, Lipitor, metformin, valsartan hydrochlorothiazide and farxyga .      Asencion Partridge Lakenzie Mcclafferty MD  03/25/2016   CC: [name], [address] [place], [place] [address]

## 2016-04-04 ENCOUNTER — Telehealth: Payer: Self-pay | Admitting: Neurology

## 2016-04-04 NOTE — Telephone Encounter (Signed)
Pt called in to check on setting up an appt for sleep study. Please call

## 2016-04-09 ENCOUNTER — Telehealth: Payer: Self-pay | Admitting: Neurology

## 2016-04-09 DIAGNOSIS — R0683 Snoring: Secondary | ICD-10-CM

## 2016-04-09 NOTE — Telephone Encounter (Signed)
[employer] denied Split sleep test however states HST can be preformed without an authorization.  Can I get an order for HST?

## 2016-04-10 NOTE — Telephone Encounter (Signed)
HST ordered after insurance denial.

## 2016-04-22 ENCOUNTER — Telehealth: Payer: Self-pay | Admitting: Neurology

## 2016-04-22 NOTE — Telephone Encounter (Signed)
[place]/[employer] [phone] called said pt is requesting an in home sleep study. She said RN could call the pt back.

## 2016-04-22 NOTE — Telephone Encounter (Signed)
HST has already been ordered.  Jacqueline Duran, can you call this pt to be scheduled for her HST?

## 2016-05-09 ENCOUNTER — Encounter (INDEPENDENT_AMBULATORY_CARE_PROVIDER_SITE_OTHER): Payer: 59 | Admitting: Neurology

## 2016-05-09 DIAGNOSIS — R0683 Snoring: Secondary | ICD-10-CM

## 2016-05-09 DIAGNOSIS — G4710 Hypersomnia, unspecified: Secondary | ICD-10-CM | POA: Diagnosis not present

## 2016-05-27 ENCOUNTER — Telehealth: Payer: Self-pay

## 2016-05-27 DIAGNOSIS — G4733 Obstructive sleep apnea (adult) (pediatric): Secondary | ICD-10-CM

## 2016-05-27 NOTE — Telephone Encounter (Signed)
I spoke to pt and advised her that her HST revealed severe hypoxemia, severe osa and possibly obesity hypoventilation and immediate treatment is advised. PAP pressure in an attended setting for titration of therapy is indicated and oxygen supplementation may be required to alleviate hypoxemia if not responsive to CPAP alone. Alternative therapies such as oral appliance or ENT evaluation do not correct this degree of apnea. Pt verbalized understanding of results. Pt is agreeable to a cpap titration. I advised pt that weight loss and positional therapy are recommended. Pt was advised to not drive or operate hazardous machinery when sleepy.

## 2016-06-12 ENCOUNTER — Telehealth: Payer: Self-pay | Admitting: Neurology

## 2016-06-12 DIAGNOSIS — G4733 Obstructive sleep apnea (adult) (pediatric): Secondary | ICD-10-CM

## 2016-06-12 NOTE — Telephone Encounter (Signed)
[employer] denied CPAP titration suggest auto pap

## 2016-06-12 NOTE — Telephone Encounter (Signed)
[employer] denied CPAP Titration and suggested auto pap.

## 2016-06-13 NOTE — Telephone Encounter (Signed)
I called pt to discuss. No answer, left a message asking her to call me back. 

## 2016-06-13 NOTE — Telephone Encounter (Signed)
Insurance denies attended split-night polysomnography in spite of suspicion for obesity hypoventilation syndrome and ordered hypoxemia titration if needed.  H auto pap recommended.  Auto pap will be 5-15 cm water CD

## 2016-06-13 NOTE — Addendum Note (Signed)
Addended by: Lester Seabrook A on: 06/13/2016 12:17 PM   Modules accepted: Orders

## 2016-06-18 NOTE — Telephone Encounter (Signed)
I spoke to pt and advised her that [facility] denied her cpap titration and therefore Dr. Brett Fairy recommends that she start an auto pap. Pt is agreeable. I advised her that I would send the order to [facility] and they would call her in about a week to get her cpap set up. Pt verbalized understanding. A follow up appt was made for 08/06/16 with Dr. Brett Fairy. Pt had no questions at this time but was encouraged to call back if questions arise.

## 2016-06-20 ENCOUNTER — Ambulatory Visit (INDEPENDENT_AMBULATORY_CARE_PROVIDER_SITE_OTHER): Payer: 59 | Admitting: Family Medicine

## 2016-06-20 VITALS — BP 110/62 | HR 72 | Temp 98.9°F | Wt 258.6 lb

## 2016-06-20 DIAGNOSIS — E1165 Type 2 diabetes mellitus with hyperglycemia: Secondary | ICD-10-CM

## 2016-06-20 DIAGNOSIS — IMO0002 Reserved for concepts with insufficient information to code with codable children: Secondary | ICD-10-CM

## 2016-06-20 DIAGNOSIS — E1151 Type 2 diabetes mellitus with diabetic peripheral angiopathy without gangrene: Secondary | ICD-10-CM

## 2016-06-20 DIAGNOSIS — I10 Essential (primary) hypertension: Secondary | ICD-10-CM

## 2016-06-20 DIAGNOSIS — IMO0001 Reserved for inherently not codable concepts without codable children: Secondary | ICD-10-CM

## 2016-06-20 DIAGNOSIS — F411 Generalized anxiety disorder: Secondary | ICD-10-CM

## 2016-06-20 DIAGNOSIS — E785 Hyperlipidemia, unspecified: Secondary | ICD-10-CM | POA: Diagnosis not present

## 2016-06-20 LAB — HEMOGLOBIN A1C: Hgb A1c MFr Bld: 7 % — ABNORMAL HIGH (ref 4.6–6.5)

## 2016-06-20 LAB — LIPID PANEL
Cholesterol: 124 mg/dL (ref 0–200)
HDL: 46.8 mg/dL (ref >39.00)
LDL Cholesterol: 48 mg/dL (ref 0–99)
NONHDL: 76.96
Total CHOL/HDL Ratio: 3
Triglycerides: 145 mg/dL (ref 0.0–149.0)
VLDL: 29 mg/dL (ref 0.0–40.0)

## 2016-06-20 LAB — COMPREHENSIVE METABOLIC PANEL
ALK PHOS: 95 U/L (ref 39–117)
ALT: 18 U/L (ref 0–35)
AST: 19 U/L (ref 0–37)
Albumin: 3.9 g/dL (ref 3.5–5.2)
BILIRUBIN TOTAL: 0.4 mg/dL (ref 0.2–1.2)
BUN: 14 mg/dL (ref 6–23)
CO2: 33 meq/L — AB (ref 19–32)
CREATININE: 0.78 mg/dL (ref 0.40–1.20)
Calcium: 9.5 mg/dL (ref 8.4–10.5)
Chloride: 104 mEq/L (ref 96–112)
GFR: 79.33 mL/min (ref >60.00)
GLUCOSE: 128 mg/dL — AB (ref 70–99)
Potassium: 4.6 mEq/L (ref 3.5–5.1)
SODIUM: 142 meq/L (ref 135–145)
TOTAL PROTEIN: 7.4 g/dL (ref 6.0–8.3)

## 2016-06-20 LAB — POCT URINALYSIS DIPSTICK
BILIRUBIN UA: NEGATIVE
KETONES UA: NEGATIVE
Leukocytes, UA: NEGATIVE
Nitrite, UA: NEGATIVE
Protein, UA: NEGATIVE
RBC UA: NEGATIVE
SPEC GRAV UA: 1.02
Urobilinogen, UA: 0.2
pH, UA: 6

## 2016-06-20 MED ORDER — ATORVASTATIN CALCIUM 10 MG PO TABS: 10.0000 mg | ORAL_TABLET | Freq: Every day | ORAL | 1 refills | Status: DC

## 2016-06-20 MED ORDER — ESCITALOPRAM OXALATE 20 MG PO TABS: 20.0000 mg | ORAL_TABLET | Freq: Every day | ORAL | 1 refills | Status: DC

## 2016-06-20 MED ORDER — VALSARTAN-HYDROCHLOROTHIAZIDE 160-25 MG PO TABS: 1.0000 | ORAL_TABLET | Freq: Every day | ORAL | 1 refills | Status: DC

## 2016-06-20 NOTE — Patient Instructions (Signed)

## 2016-06-20 NOTE — Progress Notes (Signed)
Pre visit review using our clinic review tool, if applicable. No additional management support is needed unless otherwise documented below in the visit note. 

## 2016-06-20 NOTE — Progress Notes (Signed)
Patient ID: Jacqueline Duran, female    DOB: 1951/03/25  Age: 65 y.o. MRN: [id number]    Subjective:  Subjective  HPI Jacqueline Duran presents for f/u dm, bp and cholesterol and get meds.    Review of Systems  Constitutional: Negative for appetite change, diaphoresis, fatigue and unexpected weight change.  Eyes: Negative for pain, redness and visual disturbance.  Respiratory: Negative for cough, chest tightness, shortness of breath and wheezing.   Cardiovascular: Negative for chest pain, palpitations and leg swelling.  Endocrine: Negative for cold intolerance, heat intolerance, polydipsia, polyphagia and polyuria.  Genitourinary: Negative for difficulty urinating, dysuria and frequency.  Neurological: Negative for dizziness, light-headedness, numbness and headaches.    History Past Medical History:  Diagnosis Date  . Colon polyps   . Deafness    Right Ear  . Diabetes mellitus   . Hyperlipidemia   . Hypertension     She has a past surgical history that includes Abdominal hysterectomy; Tonsillectomy; Cervical fusion (07/2004); and Colonoscopy (8/11).   Her family history includes Alcohol abuse in her other; Cancer in her father; Clotting disorder in her sister; Coronary artery disease in her mother; Factor V Leiden deficiency in her other; Heart disease in her other; Leukemia in her maternal uncle; Lung cancer in her father.She reports that she has never smoked. She has never used smokeless tobacco. She reports that she does not drink alcohol or use drugs.  Current Outpatient Prescriptions on File Prior to Visit  Medication Sig Dispense Refill  . albuterol (PROVENTIL HFA;VENTOLIN HFA) 108 (90 Base) MCG/ACT inhaler Inhale 2 puffs into the lungs every 6 (six) hours as needed for wheezing or shortness of breath. 1 Inhaler 2  . ascorbic acid (VITAMIN C) 500 MG tablet Take 1,000 mg by mouth daily.     Marland Kitchen aspirin 81 MG tablet Take 81 mg by mouth daily.      . fluticasone (FLOVENT HFA) 110  MCG/ACT inhaler Inhale 2 puffs into the lungs 2 (two) times daily. 1 Inhaler 0  . folic acid (FOLVITE) 1 MG tablet TAKE 1 TABLET(1 MG) BY MOUTH DAILY 90 tablet 1  . glucose blood (ONE TOUCH ULTRA TEST) test strip Check blood sugar once daily. Dx:E11.9 100 each 5  . Lactobacillus (PROBIOTIC ACIDOPHILUS PO) Take by mouth.    . Magnesium 500 MG TABS Take 1 tablet by mouth daily.    Marland Kitchen MEGARED OMEGA-3 KRILL OIL 500 MG CAPS Take 1 capsule by mouth daily.    . Multiple Vitamin (MULTIVITAMIN) tablet Take 1 tablet by mouth daily.      [name] DELICA LANCETS FINE MISC Check blood sugar once daily. Dx:E11.9 100 each 5  . Turmeric 500 MG CAPS Take 1 capsule by mouth daily.    . vitamin E 400 UNIT capsule Take 400 Units by mouth daily.     No current facility-administered medications on file prior to visit.      Objective:  Objective  Physical Exam  Constitutional: She is oriented to person, place, and time. She appears well-developed and well-nourished.  HENT:  Head: Normocephalic and atraumatic.  Eyes: Conjunctivae and EOM are normal.  Neck: Normal range of motion. Neck supple. No JVD present. Carotid bruit is not present. No thyromegaly present.  Cardiovascular: Normal rate, regular rhythm and normal heart sounds.   No murmur heard. Pulmonary/Chest: Effort normal and breath sounds normal. No respiratory distress. She has no wheezes. She has no rales. She exhibits no tenderness.  Musculoskeletal: She exhibits no edema.  Neurological: She is alert and oriented to person, place, and time.  Psychiatric: She has a normal mood and affect. Her behavior is normal. Judgment and thought content normal.  Nursing note and vitals reviewed. monofilament--  Unable to feel monofilament BP 110/62 (BP Location: Left Arm, Patient Position: Sitting, Cuff Size: Normal)   Pulse 72   Temp 98.9 F (37.2 C) (Oral)   Wt 258 lb 9.6 oz (117.3 kg)   SpO2 96%   BMI 44.39 kg/m  Wt Readings from Last 3 Encounters:    06/20/16 258 lb 9.6 oz (117.3 kg)  03/25/16 253 lb (114.8 kg)  03/15/16 254 lb 9.6 oz (115.5 kg)     Lab Results  Component Value Date   WBC 11.6 (H) 03/15/2016   HGB 14.2 03/15/2016   HCT 43.6 03/15/2016   PLT 148.0 (L) 03/15/2016   GLUCOSE 128 (H) 06/20/2016   CHOL 124 06/20/2016   TRIG 145.0 06/20/2016   HDL 46.80 06/20/2016   LDLCALC 48 06/20/2016   ALT 18 06/20/2016   AST 19 06/20/2016   NA 142 06/20/2016   K 4.6 06/20/2016   CL 104 06/20/2016   CREATININE 0.78 06/20/2016   BUN 14 06/20/2016   CO2 33 (H) 06/20/2016   TSH 1.61 12/12/2015   HGBA1C 7.0 (H) 06/20/2016   MICROALBUR <0.7 03/15/2016    Dg Chest 2 View  Result Date: 02/28/2016 CLINICAL DATA:  Productive cough for 3 weeks. Shortness of breath. Wheezing. EXAM: CHEST  2 VIEW COMPARISON:  06/08/2004 FINDINGS: The heart size and mediastinal contours are within normal limits. Both lungs are clear. No evidence of pneumothorax or pleural effusion. Cervical spine fusion hardware noted. IMPRESSION: No active cardiopulmonary disease. Electronically Signed   By: Earle Gell M.D.   On: 02/28/2016 16:42     Assessment & Plan:  Plan  I am having Ms. Hassinger maintain her ascorbic acid, aspirin, multivitamin, vitamin E, Magnesium, MEGARED OMEGA-3 KRILL OIL, Lactobacillus (PROBIOTIC ACIDOPHILUS PO), Turmeric, folic acid, glucose blood, ONETOUCH DELICA LANCETS FINE, fluticasone, albuterol, valsartan-hydrochlorothiazide, escitalopram, SitaGLIPtin-MetFORMIN HCl, dapagliflozin propanediol, and atorvastatin.  Meds ordered this encounter  Medications  . valsartan-hydrochlorothiazide (DIOVAN-HCT) 160-25 MG tablet    Sig: Take 1 tablet by mouth daily.    Dispense:  90 tablet    Refill:  1  . escitalopram (LEXAPRO) 20 MG tablet    Sig: Take 1 tablet (20 mg total) by mouth daily.    Dispense:  90 tablet    Refill:  1  . DISCONTD: atorvastatin (LIPITOR) 10 MG tablet    Sig: Take 1 tablet (10 mg total) by mouth daily.    Dispense:   90 tablet    Refill:  1    D/C PREVIOUS SCRIPTS FOR THIS MEDICATION  . SitaGLIPtin-MetFORMIN HCl 50-1000 MG TB24    Sig: Take 2 tablets by mouth daily.    Dispense:  180 tablet    Refill:  1  . dapagliflozin propanediol (FARXIGA) 10 MG TABS tablet    Sig: Take 10 mg by mouth daily.    Dispense:  90 tablet    Refill:  1    New Rx based on recent labs. Please call the patient when ready.  Marland Kitchen atorvastatin (LIPITOR) 10 MG tablet    Sig: Take 1 tablet (10 mg total) by mouth daily.    Dispense:  90 tablet    Refill:  1    D/C PREVIOUS SCRIPTS FOR THIS MEDICATION    Problem List Items Addressed This Visit  Unprioritized   DM (diabetes mellitus) type II uncontrolled, periph vascular disorder (HCC) - Primary    con't meds Check labs      Relevant Medications   valsartan-hydrochlorothiazide (DIOVAN-HCT) 160-25 MG tablet   SitaGLIPtin-MetFORMIN HCl 50-1000 MG TB24   dapagliflozin propanediol (FARXIGA) 10 MG TABS tablet   atorvastatin (LIPITOR) 10 MG tablet   Other Relevant Orders   Comprehensive metabolic panel (Completed)   Hemoglobin A1c (Completed)   POCT urinalysis dipstick (Completed)   Essential hypertension    Stable con't meds      Relevant Medications   valsartan-hydrochlorothiazide (DIOVAN-HCT) 160-25 MG tablet   atorvastatin (LIPITOR) 10 MG tablet   Hyperlipidemia LDL goal <100    con't lipitor Check labs      Relevant Medications   valsartan-hydrochlorothiazide (DIOVAN-HCT) 160-25 MG tablet   atorvastatin (LIPITOR) 10 MG tablet    Other Visit Diagnoses    Hyperlipidemia LDL goal <70       Relevant Medications   valsartan-hydrochlorothiazide (DIOVAN-HCT) 160-25 MG tablet   atorvastatin (LIPITOR) 10 MG tablet   Other Relevant Orders   Lipid panel (Completed)   Comprehensive metabolic panel (Completed)   POCT urinalysis dipstick (Completed)   Uncontrolled type 2 diabetes mellitus without complication, without long-term current use of insulin (HCC)        Relevant Medications   valsartan-hydrochlorothiazide (DIOVAN-HCT) 160-25 MG tablet   SitaGLIPtin-MetFORMIN HCl 50-1000 MG TB24   dapagliflozin propanediol (FARXIGA) 10 MG TABS tablet   atorvastatin (LIPITOR) 10 MG tablet   Generalized anxiety disorder       Relevant Medications   escitalopram (LEXAPRO) 20 MG tablet   Hyperlipidemia       Relevant Medications   valsartan-hydrochlorothiazide (DIOVAN-HCT) 160-25 MG tablet   atorvastatin (LIPITOR) 10 MG tablet      Follow-up: Return in about 6 months (around 12/21/2016) for hypertension, hyperlipidemia, diabetes II.  [name], DO

## 2016-06-21 ENCOUNTER — Encounter: Payer: Self-pay | Admitting: Family Medicine

## 2016-06-21 MED ORDER — DAPAGLIFLOZIN PROPANEDIOL 10 MG PO TABS: 10.0000 mg | ORAL_TABLET | Freq: Every day | ORAL | 1 refills | Status: DC

## 2016-06-21 MED ORDER — ATORVASTATIN CALCIUM 10 MG PO TABS: 10.0000 mg | ORAL_TABLET | Freq: Every day | ORAL | 1 refills | Status: DC

## 2016-06-21 MED ORDER — SITAGLIP PHOS-METFORMIN HCL ER 50-1000 MG PO TB24: 2.0000 | ORAL_TABLET | Freq: Every day | ORAL | 1 refills | Status: DC

## 2016-06-21 NOTE — Assessment & Plan Note (Signed)
Stable con't meds 

## 2016-06-21 NOTE — Assessment & Plan Note (Signed)
con't meds  Check labs 

## 2016-06-21 NOTE — Assessment & Plan Note (Signed)
con't lipitor Check labs 

## 2016-06-24 ENCOUNTER — Encounter: Payer: Self-pay | Admitting: Family Medicine

## 2016-06-24 DIAGNOSIS — IMO0001 Reserved for inherently not codable concepts without codable children: Secondary | ICD-10-CM

## 2016-06-24 DIAGNOSIS — E1165 Type 2 diabetes mellitus with hyperglycemia: Principal | ICD-10-CM

## 2016-06-24 DIAGNOSIS — E1151 Type 2 diabetes mellitus with diabetic peripheral angiopathy without gangrene: Secondary | ICD-10-CM

## 2016-06-24 DIAGNOSIS — IMO0002 Reserved for concepts with insufficient information to code with codable children: Secondary | ICD-10-CM

## 2016-06-24 MED ORDER — SITAGLIP PHOS-METFORMIN HCL ER 50-1000 MG PO TB24: 2.0000 | ORAL_TABLET | Freq: Every day | ORAL | 1 refills | Status: DC

## 2016-06-24 MED ORDER — DAPAGLIFLOZIN PROPANEDIOL 10 MG PO TABS: 10.0000 mg | ORAL_TABLET | Freq: Every day | ORAL | 1 refills | Status: DC

## 2016-07-12 LAB — HM MAMMOGRAPHY

## 2016-07-17 ENCOUNTER — Encounter: Payer: Self-pay | Admitting: Family Medicine

## 2016-08-04 ENCOUNTER — Other Ambulatory Visit: Payer: Self-pay | Admitting: Family Medicine

## 2016-08-04 DIAGNOSIS — IMO0001 Reserved for inherently not codable concepts without codable children: Secondary | ICD-10-CM

## 2016-08-04 DIAGNOSIS — E1165 Type 2 diabetes mellitus with hyperglycemia: Principal | ICD-10-CM

## 2016-08-06 ENCOUNTER — Ambulatory Visit (INDEPENDENT_AMBULATORY_CARE_PROVIDER_SITE_OTHER): Payer: 59 | Admitting: Neurology

## 2016-08-06 ENCOUNTER — Encounter: Payer: Self-pay | Admitting: Neurology

## 2016-08-06 VITALS — BP 130/78 | HR 72 | Resp 20 | Ht 64.0 in | Wt 254.0 lb

## 2016-08-06 DIAGNOSIS — E662 Morbid (severe) obesity with alveolar hypoventilation: Secondary | ICD-10-CM | POA: Diagnosis not present

## 2016-08-06 NOTE — Patient Instructions (Signed)
I reviewed the patient's CPAP compliance data, which are insufficient at the time. Needs to use CPAP on week ends and longer ( plus 30 minutes )    I will review this data with the patient at the next office visit, which is currently routinely scheduled for 1 month from now , provide feedback and additional troubleshooting if need be.  Jacqueline Davern, MD  [facility] ([facility])

## 2016-08-06 NOTE — Progress Notes (Signed)
[facility]INE [facility]   Provider:  Larey Seat, M D  Referring Provider: Carollee Herter, Alferd Apa, * Primary Care Physician:  [name], DO  Chief Complaint  Patient presents with  . Follow-up    feels better on cpap, more energy    HPI:  Jacqueline Duran is a 65 y.o. female, seen here as a referral from Dr. Etter Sjogren for a sleep evaluation.   Chief complaint according to patient : Jacqueline Duran is now deceased husband has told her that she "" shock the house "when sleeping at night. He described as a thunderous snorer and had also witnessed apneas. Her primary care physician Garnet Koyanagi, D.O. referred her, partially because of this report, but also because she has some past medical history that constitutes comorbidity with obstructive sleep apnea. This includes diabetes mellitus, hyperlipidemia and hypertension. Morbid obesity. She gained weight with each pregnancy. She had very large babies, both over 9 pounds. She reports that she is 4-5 times each night up out of sleep to go to the bathroom. She also feels fatigued and sleepy in daytime. 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 She can work anytime between 6 AM and 10 PM.   Sleep habits are as follows: Some evening she will dose of on the couch but her proper bedtime begins at about 11:30 after the late night news. She usually falls asleep right after 11:30 PM, but she will be up several times due to urinary urge. She also feels a dry mouth when she wakes up. She seems to mouth breathe in her sleep. Her bedroom is cool, but she sleeps all night with a TV sound at the background. She states that when the TV is off she wakes up. She has been sleeping with a TV in the background 43 years. She slept 3 alarms to be ready in the morning 5.30- 6.00 AM . Some mornings she feels refreshed from others not so. She wakes up frequently  with morning headaches, a global and throbbing sensation ,not associated with nausea, palpitations, chest pain or shortness of breath. On the weekends she will sleep in later into the day , she will wake up spontaneously at 9 AM. During the workweek she will get only about  5 hours of sleep at night and sometimes less. She has anecdotally reported that she once fell asleep on the commode.  Sleep medical history and family sleep history: father was a loud snorer.  Social history:  Widowed, husband died of Alzheimer's, his mother had it ( fronto temporal ).  ETOH - rarely, last drink 2 years ago. Tobacco use -none. Caffeine: She sleeps with a diet pepsi on the bed stand. Double shot coffees in AM,   Only in restaurants -Iced tea , not longer at home .  Her daughters are 15 and 79, 2 stepchildren 67 and 22. 10 grandchildren, " 8 great grands".   Interval history from 08/06/2016. Jacqueline Duran underwent a  home sleep test on 05/09/2016 which diagnosed her with severe apnea at an AHI of 49, RDI of 52, regular heart rate, hypoxemia which was sustained according to the home sleep test was 330 minutes. Nadir was 70%. Based on this I have recommended to use CPAP which had to be prescribed as an auto- titration based on her insurance. She did not bring her machine.  Whenever have to do  today is to remind her that she has to use the machine daily for at least 4 hours each night, otherwise her insurance will not pacer for supplies. In addition I will decrease the expiratory pressure relief from 3-2 cm water. This should help to achieve the 95th percentile.  Jacqueline Duran explained to me today that she is working some extra hours and often comes home very late at night. On weekends she sometimes has fallen asleep before she could use her CPAP. Her compliance at this time looks poor at 57% only over 4 hours and the average user time was only 3 hours and 47 minutes. She has felt much better while using it and she needs a  pressure at the 95th percentile of 13.1 cm. Currently she is using an AutoSet between 5 and 15 with a 3 cm EPR, her residual AHI is 4.1. Since most residual apneas are obstructive her pressure needs a probably slight bit higher than what we have provided for her.  Review of Systems: Out of a complete 14 system review, the patient complains of only the following symptoms, and all other reviewed systems are negative. Fatigue severity score is n/a was 47 before CPAP  and Epworth sleepiness score is reduced to  13 from 15, geriatric depression score is N/A .  Social History   Social History  . Marital status: Married    Spouse name: N/A  . Number of children: N/A  . Years of education: N/A   Occupational History  . unemployed    Social History Main Topics  . Smoking status: Never Smoker  . Smokeless tobacco: Never Used  . Alcohol use No  . Drug use: No  . Sexual activity: Not Currently    Partners: Male   Other Topics Concern  . Not on file   Social History Narrative   Husband dx with dementia 12/2011    Family History  Problem Relation Age of Onset  . Lung cancer Father   . Cancer Father     LUNG  . Heart attack    . Clotting disorder Sister     Factor V  . Alcohol abuse    . Coronary artery disease Mother   . Stroke      Female <50/Female <60  . Leukemia Maternal Uncle   . Heart disease Other   . Factor V Leiden deficiency Other     THROMBOEBOLISM CLOTTING DISORDER  . Alcohol abuse Other     Past Medical History:  Diagnosis Date  . Colon polyps   . Deafness    Right Ear  . Diabetes mellitus   . Hyperlipidemia   . Hypertension     Past Surgical History:  Procedure Laterality Date  . ABDOMINAL HYSTERECTOMY    . CERVICAL FUSION  07/2004  . COLONOSCOPY  8/11   Hyperplastic polyps  . TONSILLECTOMY      Current Outpatient Prescriptions  Medication Sig Dispense Refill  . albuterol (PROVENTIL HFA;VENTOLIN HFA) 108 (90 Base) MCG/ACT inhaler Inhale 2 puffs into  the lungs every 6 (six) hours as needed for wheezing or shortness of breath. 1 Inhaler 2  . ascorbic acid (VITAMIN C) 500 MG tablet Take 1,000 mg by mouth daily.     Marland Kitchen aspirin 81 MG tablet Take 81 mg by mouth daily.      Marland Kitchen atorvastatin (LIPITOR) 10 MG tablet Take 1 tablet (10 mg total) by mouth daily. 90 tablet 1  . dapagliflozin propanediol (FARXIGA) 10 MG TABS tablet Take  10 mg by mouth daily. 90 tablet 1  . escitalopram (LEXAPRO) 20 MG tablet Take 1 tablet (20 mg total) by mouth daily. 90 tablet 1  . fluticasone (FLOVENT HFA) 110 MCG/ACT inhaler Inhale 2 puffs into the lungs 2 (two) times daily. 1 Inhaler 0  . folic acid (FOLVITE) 1 MG tablet TAKE 1 TABLET(1 MG) BY MOUTH DAILY 90 tablet 1  . glucose blood (ONE TOUCH ULTRA TEST) test strip Check blood sugar once daily. Dx:E11.9 100 each 5  . Lactobacillus (PROBIOTIC ACIDOPHILUS PO) Take by mouth.    . Magnesium 500 MG TABS Take 1 tablet by mouth daily.    Marland Kitchen MEGARED OMEGA-3 KRILL OIL 500 MG CAPS Take 1 capsule by mouth daily.    . Multiple Vitamin (MULTIVITAMIN) tablet Take 1 tablet by mouth daily.      [name] DELICA LANCETS FINE MISC Check blood sugar once daily. Dx:E11.9 100 each 5  . SitaGLIPtin-MetFORMIN HCl 50-1000 MG TB24 Take 2 tablets by mouth daily. 180 tablet 1  . Turmeric 500 MG CAPS Take 1 capsule by mouth daily.    . valsartan-hydrochlorothiazide (DIOVAN-HCT) 160-25 MG tablet Take 1 tablet by mouth daily. 90 tablet 1   No current facility-administered medications for this visit.     Allergies as of 08/06/2016 - Review Complete 08/06/2016  Allergen Reaction Noted  . Amlodipine besylate  05/26/2009  . Other Itching 04/21/2015    Vitals: BP 130/78   Pulse 72   Resp 20   Ht 5\' 4"  (1.626 m)   Wt 254 lb (115.2 kg)   BMI 43.60 kg/m  Last Weight:  Wt Readings from Last 1 Encounters:  08/06/16 254 lb (115.2 kg)   [id number] mass index is 43.6 kg/m.     Last Height:   Ht Readings from Last 1 Encounters:  08/06/16 5\' 4"   (1.626 m)    Physical exam:  General: The patient is awake, alert and appears not in acute distress. The patient is well groomed. Head: Normocephalic, atraumatic. Neck is supple. Mallampati 5,  neck circumference:17. 5. Nasal airflow intact  evident . Retrognathia is seen.  Patient is status post tonsillectomy, she had an anterior cervical fusion with a well-healed scar on the left. Cardiovascular:  Regular rate and rhythm*, without murmurs or carotid bruit, and without distended neck veins. Respiratory: Lungs are clear to auscultation. Skin:  Without evidence of edema, or rash Trunk: BMI is elevated - 44. The patient's posture is erect   Neurologic exam : The patient is awake and alert, oriented to place and time.   Memory subjective described as intact.  Attention span & concentration ability appears normal.  Speech is fluent, without dysarthria, dysphonia or aphasia.  Mood and affect are appropriate.  Cranial nerves: Pupils are equal and briskly reactive to light. Funduscopic exam without evidence of pallor or edema.  Extraocular movements in vertical and horizontal planes intact and without nystagmus. Visual fields by finger perimetry are intact. Hearing to finger rub intact. Facial sensation intact to fine touch. Facial motor strength is symmetric and tongue and uvula move midline. Shoulder shrug was symmetrical.   Motor exam:   Normal tone, muscle bulk and symmetric strength in all extremities.  Sensory:  Fine touch, pinprick and vibration, Proprioception normal.  Coordination: Rapid alternating movements in the fingers/hands was normal.  Finger-to-nose maneuver  normal without evidence of ataxia, dysmetria or tremor.  Gait and station: Patient walks without assistive device and is able unassisted to climb up to the exam  table. Strength within normal limits.  Stance is stable and normal.  Deep tendon reflexes: in the  upper and lower extremities are symmetric and intact.    The patient was advised of the nature of the diagnosed sleep disorder , the treatment options and risks for general a health and wellness arising from not treating the condition.  I spent more than 50 minutes of face to face time with the patient. Greater than 50% of time was spent in counseling and coordination of care. We have discussed the diagnosis and differential and I answered the patient's questions.     Assessment:  After physical and neurologic examination, review of laboratory studies,  Personal review of imaging studies, reports of other /same  Imaging studies ,  Results of polysomnography/ neurophysiology testing and pre-existing records as far as provided in visit., my assessment is   1) Jacqueline Duran has severe  obstructive sleep apnea based on her HST, main risk factor is a  body mass index of 44,status after tonsillectomy. Her previously reported  frequent nocturia is controlled on CPAP.Marland Kitchen She is actually sleep deprived given that she only sleeps between 5 hours and 6 hours on most nights.  She endorsed the Epworth sleepiness score at 13 , only marginally down from 15 .   2)The patient suffers from winter bronchitis, but has no history of COPD, asthma or sarcoid. There could be a component of obesity hypoventilation, based on her HST hypoxemia data.  HST did not allow for oxygen titration and does not permit Co2 data. I will order a ONO on CPAP to gather additional data.    Plan:  Treatment plan and additional workup :  ONO, on CPAP. Follow up with NP in 1 month, with hopefully better compliance to CPAP.   I reviewed the patient's medication list; she does take Lexapro, albuterol, Flovent, nutritional supplements and vitamins, Lipitor, metformin, valsartan hydrochlorothiazide and farxyga .    Asencion Partridge Devanshi Califf MD  08/06/2016   CC: [name], [address], [place] [id number]

## 2016-08-06 NOTE — Addendum Note (Signed)
Addended by: Larey Seat on: 08/06/2016 10:03 AM   Modules accepted: Orders

## 2016-08-15 ENCOUNTER — Encounter: Payer: Self-pay | Admitting: Family Medicine

## 2016-08-15 ENCOUNTER — Telehealth: Payer: Self-pay

## 2016-08-15 NOTE — Telephone Encounter (Signed)
I spoke to pt and advised her that her ONO results were normal. Pt verbalized understanding of results. Pt had no questions at this time but was encouraged to call back if questions arise.

## 2016-09-08 ENCOUNTER — Encounter: Payer: Self-pay | Admitting: Neurology

## 2016-09-10 ENCOUNTER — Encounter: Payer: Self-pay | Admitting: Adult Health

## 2016-09-10 ENCOUNTER — Ambulatory Visit (INDEPENDENT_AMBULATORY_CARE_PROVIDER_SITE_OTHER): Payer: 59 | Admitting: Family Medicine

## 2016-09-10 ENCOUNTER — Encounter: Payer: Self-pay | Admitting: Family Medicine

## 2016-09-10 ENCOUNTER — Ambulatory Visit (INDEPENDENT_AMBULATORY_CARE_PROVIDER_SITE_OTHER): Payer: 59 | Admitting: Adult Health

## 2016-09-10 VITALS — BP 110/60 | HR 67 | Temp 98.8°F | Resp 16 | Ht 64.0 in | Wt 255.8 lb

## 2016-09-10 VITALS — BP 117/63 | HR 68 | Resp 16 | Ht 64.0 in | Wt 254.0 lb

## 2016-09-10 DIAGNOSIS — Z9989 Dependence on other enabling machines and devices: Secondary | ICD-10-CM

## 2016-09-10 DIAGNOSIS — G4733 Obstructive sleep apnea (adult) (pediatric): Secondary | ICD-10-CM | POA: Diagnosis not present

## 2016-09-10 DIAGNOSIS — J209 Acute bronchitis, unspecified: Secondary | ICD-10-CM | POA: Diagnosis not present

## 2016-09-10 MED ORDER — AZITHROMYCIN 250 MG PO TABS: ORAL_TABLET | ORAL | 0 refills | Status: DC

## 2016-09-10 MED ORDER — BENZONATATE 100 MG PO CAPS: 200.0000 mg | ORAL_CAPSULE | Freq: Three times a day (TID) | ORAL | 0 refills | Status: DC | PRN

## 2016-09-10 MED ORDER — PREDNISONE 10 MG PO TABS: ORAL_TABLET | ORAL | 0 refills | Status: DC

## 2016-09-10 NOTE — Patient Instructions (Signed)

## 2016-09-10 NOTE — Progress Notes (Signed)
Pre visit review using our clinic review tool, if applicable. No additional management support is needed unless otherwise documented below in the visit note. 

## 2016-09-10 NOTE — Progress Notes (Signed)
Patient ID: Jacqueline Duran, female    DOB: 1950/11/17  Age: 65 y.o. MRN: [id number]    Subjective:  Subjective  HPI [name] presents for cough x 1 week + mucous.  No fever, chills.  Taking mucinex and delsym with no relief.  Cough is worse at night.  No chest pain.  No nasal congestion   Review of Systems  Constitutional: Negative for chills and fever.  HENT: Negative for congestion, postnasal drip, rhinorrhea and sinus pressure.   Respiratory: Positive for cough, chest tightness, shortness of breath and wheezing.   Cardiovascular: Negative for chest pain, palpitations and leg swelling.  Allergic/Immunologic: Negative for environmental allergies.    History Past Medical History:  Diagnosis Date  . Colon polyps   . Deafness    Right Ear  . Diabetes mellitus   . Hyperlipidemia   . Hypertension     She has a past surgical history that includes Abdominal hysterectomy; Tonsillectomy; Cervical fusion (07/2004); and Colonoscopy (8/11).   Her family history includes Alcohol abuse in her other; Cancer in her father; Clotting disorder in her sister; Coronary artery disease in her mother; Factor V Leiden deficiency in her other; Heart disease in her other; Leukemia in her maternal uncle; Lung cancer in her father.She reports that she has never smoked. She has never used smokeless tobacco. She reports that she does not drink alcohol or use drugs.  Current Outpatient Prescriptions on File Prior to Visit  Medication Sig Dispense Refill  . albuterol (PROVENTIL HFA;VENTOLIN HFA) 108 (90 Base) MCG/ACT inhaler Inhale 2 puffs into the lungs every 6 (six) hours as needed for wheezing or shortness of breath. 1 Inhaler 2  . ascorbic acid (VITAMIN C) 500 MG tablet Take 1,000 mg by mouth daily.     Marland Kitchen aspirin 81 MG tablet Take 81 mg by mouth daily.      Marland Kitchen atorvastatin (LIPITOR) 10 MG tablet Take 1 tablet (10 mg total) by mouth daily. 90 tablet 1  . dapagliflozin propanediol (FARXIGA) 10 MG TABS  tablet Take 10 mg by mouth daily. 90 tablet 1  . escitalopram (LEXAPRO) 20 MG tablet Take 1 tablet (20 mg total) by mouth daily. 90 tablet 1  . fluticasone (FLOVENT HFA) 110 MCG/ACT inhaler Inhale 2 puffs into the lungs 2 (two) times daily. 1 Inhaler 0  . folic acid (FOLVITE) 1 MG tablet TAKE 1 TABLET(1 MG) BY MOUTH DAILY 90 tablet 1  . glucose blood (ONE TOUCH ULTRA TEST) test strip Check blood sugar once daily. Dx:E11.9 100 each 5  . Lactobacillus (PROBIOTIC ACIDOPHILUS PO) Take by mouth.    . Magnesium 500 MG TABS Take 1 tablet by mouth daily.    Marland Kitchen MEGARED OMEGA-3 KRILL OIL 500 MG CAPS Take 1 capsule by mouth daily.    . Multiple Vitamin (MULTIVITAMIN) tablet Take 1 tablet by mouth daily.      [name] DELICA LANCETS FINE MISC Check blood sugar once daily. Dx:E11.9 100 each 5  . SitaGLIPtin-MetFORMIN HCl 50-1000 MG TB24 Take 2 tablets by mouth daily. 180 tablet 1  . Turmeric 500 MG CAPS Take 1 capsule by mouth daily.    . valsartan-hydrochlorothiazide (DIOVAN-HCT) 160-25 MG tablet Take 1 tablet by mouth daily. 90 tablet 1   No current facility-administered medications on file prior to visit.      Objective:  Objective  Physical Exam  Constitutional: She is oriented to person, place, and time. She appears well-developed and well-nourished.  HENT:  Right Ear: External  ear normal.  Left Ear: External ear normal.  Eyes: Conjunctivae are normal. Right eye exhibits no discharge. Left eye exhibits no discharge.  Cardiovascular: Normal rate, regular rhythm and normal heart sounds.   No murmur heard. Pulmonary/Chest: Effort normal. No respiratory distress. She has decreased breath sounds in the right lower field and the left lower field. She has no wheezes. She has no rales. She exhibits no tenderness.  Musculoskeletal: She exhibits no edema.  Lymphadenopathy:    She has no cervical adenopathy.  Neurological: She is alert and oriented to person, place, and time.  Nursing note and vitals  reviewed.  BP 110/60 (BP Location: Right Arm, Patient Position: Sitting, Cuff Size: Large)   Pulse 67   Temp 98.8 F (37.1 C) (Oral)   Resp 16   Ht 5\' 4"  (1.626 m)   Wt 255 lb 12.8 oz (116 kg)   SpO2 91%   BMI 43.91 kg/m  Wt Readings from Last 3 Encounters:  09/10/16 255 lb 12.8 oz (116 kg)  09/10/16 254 lb (115.2 kg)  08/06/16 254 lb (115.2 kg)     Lab Results  Component Value Date   WBC 11.6 (H) 03/15/2016   HGB 14.2 03/15/2016   HCT 43.6 03/15/2016   PLT 148.0 (L) 03/15/2016   GLUCOSE 128 (H) 06/20/2016   CHOL 124 06/20/2016   TRIG 145.0 06/20/2016   HDL 46.80 06/20/2016   LDLCALC 48 06/20/2016   ALT 18 06/20/2016   AST 19 06/20/2016   NA 142 06/20/2016   K 4.6 06/20/2016   CL 104 06/20/2016   CREATININE 0.78 06/20/2016   BUN 14 06/20/2016   CO2 33 (H) 06/20/2016   TSH 1.61 12/12/2015   HGBA1C 7.0 (H) 06/20/2016   MICROALBUR <0.7 03/15/2016    Dg Chest 2 View  Result Date: 02/28/2016 CLINICAL DATA:  Productive cough for 3 weeks. Shortness of breath. Wheezing. EXAM: CHEST  2 VIEW COMPARISON:  06/08/2004 FINDINGS: The heart size and mediastinal contours are within normal limits. Both lungs are clear. No evidence of pneumothorax or pleural effusion. Cervical spine fusion hardware noted. IMPRESSION: No active cardiopulmonary disease. Electronically Signed   By: Earle Gell M.D.   On: 02/28/2016 16:42     Assessment & Plan:  Plan  I am having Ms. Checketts start on azithromycin, predniSONE, and benzonatate. I am also having her maintain her ascorbic acid, aspirin, multivitamin, Magnesium, MEGARED OMEGA-3 KRILL OIL, Lactobacillus (PROBIOTIC ACIDOPHILUS PO), Turmeric, glucose blood, ONETOUCH DELICA LANCETS FINE, fluticasone, albuterol, valsartan-hydrochlorothiazide, escitalopram, atorvastatin, SitaGLIPtin-MetFORMIN HCl, dapagliflozin propanediol, and folic acid.  Meds ordered this encounter  Medications  . azithromycin (ZITHROMAX Z-PAK) 250 MG tablet    Sig: As directed     Dispense:  6 each    Refill:  0  . predniSONE (DELTASONE) 10 MG tablet    Sig: 3 po qd for 3 days then 2 po qd for 3 days the 1 po qd for 3 days    Dispense:  18 tablet    Refill:  0  . benzonatate (TESSALON PERLES) 100 MG capsule    Sig: Take 2 capsules (200 mg total) by mouth 3 (three) times daily as needed for cough.    Dispense:  20 capsule    Refill:  0    Problem List Items Addressed This Visit      Unprioritized   ACUTE BRONCHITIS - Primary   Relevant Medications   azithromycin (ZITHROMAX Z-PAK) 250 MG tablet   predniSONE (DELTASONE) 10 MG tablet  benzonatate (TESSALON PERLES) 100 MG capsule    call or rto prn-- otc cough meds ie mucinex or delsym for daytime.  she states the tessalon perles make her tired Follow-up: Return if symptoms worsen or fail to improve.  [name], DO

## 2016-09-10 NOTE — Progress Notes (Signed)
PATIENT: Jacqueline Duran DOB: 1951/05/19  REASON FOR VISIT: follow up- obstructive sleep apnea on CPAP HISTORY FROM: patient  HISTORY OF PRESENT ILLNESS:   Today 09/10/2016: Jacqueline Duran is a 65 year old female with a history of obstructive sleep apnea on CPAP. She returns today for a compliance download. Her download indicates that she uses her machine 22 out of 30 days for compliance of 73%. Each night she used her machine greater than 4 hours. She is on a minimum pressure of 5 cm water with a maximum pressure of 15 cm of water with EPR 2. Her residual AHI is 1.7 she has a leak in the 95th percentile at 13.4. She is currently using the nasal pillows. She states that she has noticed a difference since using the CPAP. She states that she is not as sleepy or tired throughout the day. She reports prior to using the CPAP she would drink an energy drink and a soda in order to stay awake during the day. She is no longer having to do this. She states that she does sometimes forget to use the CPAP on the weekend. She is trying to do better. She returns today for an evaluation.  HISTORY 08/06/16: Jacqueline Duran is a 65 y.o. female, seen here as a referral from Dr. Etter Sjogren for a sleep evaluation.   Chief complaint according to patient : Jacqueline Duran is now deceased husband has told her that she "" shock the house "when sleeping at night. He described as a thunderous snorer and had also witnessed apneas. Her primary care physician Garnet Koyanagi, D.O. referred her, partially because of this report, but also because she has some past medical history that constitutes comorbidity with obstructive sleep apnea. This includes diabetes mellitus, hyperlipidemia and hypertension. Morbid obesity. She gained weight with each pregnancy. She had very large babies, both over 9 pounds. She reports that she is 4-5 times each night up out of sleep to go to the bathroom. She also feels fatigued and sleepy in daytime. 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 She can work anytime between 6 AM and 10 PM.   Sleep habits are as follows: Some evening she will dose of on the couch but her proper bedtime begins at about 11:30 after the late night news. She usually falls asleep right after 11:30 PM, but she will be up several times due to urinary urge. She also feels a dry mouth when she wakes up. She seems to mouth breathe in her sleep. Her bedroom is cool, but she sleeps all night with a TV sound at the background. She states that when the TV is off she wakes up. She has been sleeping with a TV in the background 43 years. She slept 3 alarms to be ready in the morning 5.30- 6.00 AM . Some mornings she feels refreshed from others not so. She wakes up frequently with morning headaches, a global and throbbing sensation ,not associated with nausea, palpitations, chest pain or shortness of breath. On the weekends she will sleep in later into the day , she will wake up spontaneously at 9 AM. During the workweek she will get only about  5 hours of sleep at night and sometimes less. She has anecdotally reported that she once fell asleep on the commode.  Sleep medical history and family sleep history: father was a loud snorer.  Social history:  Widowed, husband died of Alzheimer's, his mother had it ( fronto temporal ).  ETOH - rarely, last drink 2 years ago. Tobacco use -none. Caffeine: She sleeps with a diet pepsi on the bed stand. Double shot coffees in AM,   Only in restaurants -Iced tea , not longer at home .  Her daughters are 30 and 35, 2 stepchildren 55 and 7. 10 grandchildren, " 8 great grands".   Interval history from 08/06/2016. Jacqueline Duran underwent a  home sleep test on 05/09/2016 which diagnosed her with severe apnea at an AHI of 49, RDI of 52, regular heart rate, hypoxemia which was sustained according to the  home sleep test was 330 minutes. Nadir was 70%. Based on this I have recommended to use CPAP which had to be prescribed as an auto- titration based on her insurance. She did not bring her machine.  Whenever have to do today is to remind her that she has to use the machine daily for at least 4 hours each night, otherwise her insurance will not pacer for supplies. In addition I will decrease the expiratory pressure relief from 3-2 cm water. This should help to achieve the 95th percentile.  Jacqueline Duran explained to me today that she is working some extra hours and often comes home very late at night. On weekends she sometimes has fallen asleep before she could use her CPAP. Her compliance at this time looks poor at 57% only over 4 hours and the average user time was only 3 hours and 47 minutes. She has felt much better while using it and she needs a pressure at the 95th percentile of 13.1 cm. Currently she is using an AutoSet between 5 and 15 with a 3 cm EPR, her residual AHI is 4.1. Since most residual apneas are obstructive her pressure needs a probably slight bit higher than what we have provided for her.  REVIEW OF SYSTEMS: Out of a complete 14 system review of symptoms, the patient complains only of the following symptoms, and all other reviewed systems are negative.  ALLERGIES: Allergies  Allergen Reactions  . Amlodipine Besylate     REACTION: swelling in feet and ankles/rash/pain  . Other Itching    INVOKANA    HOME MEDICATIONS: Outpatient Medications Prior to Visit  Medication Sig Dispense Refill  . albuterol (PROVENTIL HFA;VENTOLIN HFA) 108 (90 Base) MCG/ACT inhaler Inhale 2 puffs into the lungs every 6 (six) hours as needed for wheezing or shortness of breath. 1 Inhaler 2  . ascorbic acid (VITAMIN C) 500 MG tablet Take 1,000 mg by mouth daily.     Marland Kitchen aspirin 81 MG tablet Take 81 mg by mouth daily.      Marland Kitchen atorvastatin (LIPITOR) 10 MG tablet Take 1 tablet (10 mg total) by mouth daily. 90  tablet 1  . dapagliflozin propanediol (FARXIGA) 10 MG TABS tablet Take 10 mg by mouth daily. 90 tablet 1  . escitalopram (LEXAPRO) 20 MG tablet Take 1 tablet (20 mg total) by mouth daily. 90 tablet 1  . fluticasone (FLOVENT HFA) 110 MCG/ACT inhaler Inhale 2 puffs into the lungs 2 (two) times daily. 1 Inhaler 0  . folic acid (FOLVITE) 1 MG tablet TAKE 1 TABLET(1 MG) BY MOUTH DAILY 90 tablet 1  . glucose blood (ONE TOUCH ULTRA TEST) test strip Check blood sugar once daily. Dx:E11.9 100 each 5  . Lactobacillus (PROBIOTIC ACIDOPHILUS PO) Take by mouth.    . Magnesium 500 MG TABS Take 1 tablet  by mouth daily.    Marland Kitchen MEGARED OMEGA-3 KRILL OIL 500 MG CAPS Take 1 capsule by mouth daily.    . Multiple Vitamin (MULTIVITAMIN) tablet Take 1 tablet by mouth daily.      [name] DELICA LANCETS FINE MISC Check blood sugar once daily. Dx:E11.9 100 each 5  . SitaGLIPtin-MetFORMIN HCl 50-1000 MG TB24 Take 2 tablets by mouth daily. 180 tablet 1  . Turmeric 500 MG CAPS Take 1 capsule by mouth daily.    . valsartan-hydrochlorothiazide (DIOVAN-HCT) 160-25 MG tablet Take 1 tablet by mouth daily. 90 tablet 1   No facility-administered medications prior to visit.     PAST MEDICAL HISTORY: Past Medical History:  Diagnosis Date  . Colon polyps   . Deafness    Right Ear  . Diabetes mellitus   . Hyperlipidemia   . Hypertension     PAST SURGICAL HISTORY: Past Surgical History:  Procedure Laterality Date  . ABDOMINAL HYSTERECTOMY    . CERVICAL FUSION  07/2004  . COLONOSCOPY  8/11   Hyperplastic polyps  . TONSILLECTOMY      FAMILY HISTORY: Family History  Problem Relation Age of Onset  . Lung cancer Father   . Cancer Father     LUNG  . Heart attack    . Clotting disorder Sister     Factor V  . Alcohol abuse    . Coronary artery disease Mother   . Stroke      Female <50/Female <60  . Leukemia Maternal Uncle   . Heart disease Other   . Factor V Leiden deficiency Other     THROMBOEBOLISM CLOTTING  DISORDER  . Alcohol abuse Other     SOCIAL HISTORY: Social History   Social History  . Marital status: Married    Spouse name: N/A  . Number of children: N/A  . Years of education: N/A   Occupational History  . unemployed    Social History Main Topics  . Smoking status: Never Smoker  . Smokeless tobacco: Never Used  . Alcohol use No  . Drug use: No  . Sexual activity: Not Currently    Partners: Male   Other Topics Concern  . Not on file   Social History Narrative   Husband dx with dementia 12/2011      PHYSICAL EXAM  Vitals:   09/10/16 0724  BP: 117/63  Pulse: 68  Resp: 16  Weight: 254 lb (115.2 kg)  Height: 5\' 4"  (1.626 m)   Body mass index is 43.6 kg/m.  Generalized: Well developed, in no acute distress  Neck: Circumference 17 inches, Mallampati 4+  Neurological examination  Mentation: Alert oriented to time, place, history taking. Follows all commands speech and language fluent Cranial nerve II-XII: Pupils were equal round reactive to light. Extraocular movements were full, visual field were full on confrontational test. Facial sensation and strength were normal. Uvula tongue midline. Head turning and shoulder shrug  were normal and symmetric. Motor: The motor testing reveals 5 over 5 strength of all 4 extremities. Good symmetric motor tone is noted throughout.  Sensory: Sensory testing is intact to soft touch on all 4 extremities. No evidence of extinction is noted.  Coordination: Cerebellar testing reveals good finger-nose-finger and heel-to-shin bilaterally.  Gait and station: Gait is normal.  Reflexes: Deep tendon reflexes are symmetric and normal bilaterally.   DIAGNOSTIC DATA (LABS, IMAGING, TESTING) - I reviewed patient records, labs, notes, testing and imaging myself where available.  Lab Results  Component Value Date  WBC 11.6 (H) 03/15/2016   HGB 14.2 03/15/2016   HCT 43.6 03/15/2016   MCV 91.4 03/15/2016   PLT 148.0 (L) 03/15/2016       Component Value Date/Time   NA 142 06/20/2016 1016   K 4.6 06/20/2016 1016   CL 104 06/20/2016 1016   CO2 33 (H) 06/20/2016 1016   GLUCOSE 128 (H) 06/20/2016 1016   BUN 14 06/20/2016 1016   CREATININE 0.78 06/20/2016 1016   CALCIUM 9.5 06/20/2016 1016   PROT 7.4 06/20/2016 1016   ALBUMIN 3.9 06/20/2016 1016   AST 19 06/20/2016 1016   ALT 18 06/20/2016 1016   ALKPHOS 95 06/20/2016 1016   BILITOT 0.4 06/20/2016 1016   GFRNONAA 80.90 09/05/2010 1156   GFRAA 112 05/04/2008 0922   Lab Results  Component Value Date   CHOL 124 06/20/2016   HDL 46.80 06/20/2016   LDLCALC 48 06/20/2016   TRIG 145.0 06/20/2016   CHOLHDL 3 06/20/2016   Lab Results  Component Value Date   HGBA1C 7.0 (H) 06/20/2016   No results found for: [id number] Lab Results  Component Value Date   TSH 1.61 12/12/2015      ASSESSMENT AND PLAN 65 y.o. year old female  has a past medical history of Colon polyps; Deafness; Diabetes mellitus; Hyperlipidemia; and Hypertension. here with:  1. Obstructive sleep apnea on CPAP  Her compliance has improved since the last visit. The patient is encouraged to use the CPAP nightly for greater than 4 hours each night. Patient is amenable to this plan. She has found that CPAP beneficial. She will follow-up in 6 months for another compliance download.  Ward Givens, MSN, NP-C 09/10/2016, 7:29 AM [facility] [address], [place], [place] [address] [phone]

## 2016-09-10 NOTE — Patient Instructions (Signed)
Contine using CPAP nightly  Try to use it every night If your symptoms worsen or you develop new symptoms please let us know.

## 2016-09-10 NOTE — Progress Notes (Signed)
I agree with the assessment and plan as directed by NP .The patient is known to me .   Neysa Arts, MD  

## 2016-09-19 ENCOUNTER — Ambulatory Visit: Payer: 59 | Admitting: Family Medicine

## 2016-12-02 ENCOUNTER — Ambulatory Visit: Payer: 59 | Admitting: Medical

## 2016-12-06 ENCOUNTER — Ambulatory Visit: Payer: 59 | Admitting: Family Medicine

## 2016-12-13 ENCOUNTER — Encounter: Payer: Self-pay | Admitting: Family Medicine

## 2016-12-13 ENCOUNTER — Ambulatory Visit (INDEPENDENT_AMBULATORY_CARE_PROVIDER_SITE_OTHER): Payer: 59 | Admitting: Family Medicine

## 2016-12-13 VITALS — BP 130/70 | HR 61 | Temp 98.5°F | Resp 16 | Ht 64.5 in | Wt 255.2 lb

## 2016-12-13 DIAGNOSIS — Z0000 Encounter for general adult medical examination without abnormal findings: Secondary | ICD-10-CM

## 2016-12-13 DIAGNOSIS — E1165 Type 2 diabetes mellitus with hyperglycemia: Secondary | ICD-10-CM

## 2016-12-13 DIAGNOSIS — I10 Essential (primary) hypertension: Secondary | ICD-10-CM | POA: Diagnosis not present

## 2016-12-13 DIAGNOSIS — E1151 Type 2 diabetes mellitus with diabetic peripheral angiopathy without gangrene: Secondary | ICD-10-CM

## 2016-12-13 DIAGNOSIS — IMO0002 Reserved for concepts with insufficient information to code with codable children: Secondary | ICD-10-CM

## 2016-12-13 DIAGNOSIS — Z23 Encounter for immunization: Secondary | ICD-10-CM

## 2016-12-13 DIAGNOSIS — F411 Generalized anxiety disorder: Secondary | ICD-10-CM | POA: Diagnosis not present

## 2016-12-13 DIAGNOSIS — E785 Hyperlipidemia, unspecified: Secondary | ICD-10-CM

## 2016-12-13 DIAGNOSIS — IMO0001 Reserved for inherently not codable concepts without codable children: Secondary | ICD-10-CM

## 2016-12-13 LAB — POC URINALSYSI DIPSTICK (AUTOMATED)
BILIRUBIN UA: NEGATIVE
Glucose, UA: 1000
Ketones, UA: NEGATIVE
LEUKOCYTES UA: NEGATIVE
NITRITE UA: NEGATIVE
PH UA: 6
PROTEIN UA: NEGATIVE
RBC UA: NEGATIVE
Spec Grav, UA: >=1.03
UROBILINOGEN UA: 0.2

## 2016-12-13 LAB — COMPREHENSIVE METABOLIC PANEL
ALK PHOS: 92 U/L (ref 39–117)
ALT: 32 U/L (ref 0–35)
AST: 29 U/L (ref 0–37)
Albumin: 3.8 g/dL (ref 3.5–5.2)
BILIRUBIN TOTAL: 0.6 mg/dL (ref 0.2–1.2)
BUN: 14 mg/dL (ref 6–23)
CO2: 30 meq/L (ref 19–32)
Calcium: 9.4 mg/dL (ref 8.4–10.5)
Chloride: 103 mEq/L (ref 96–112)
Creatinine, Ser: 0.73 mg/dL (ref 0.40–1.20)
GFR: 85.49 mL/min (ref >60.00)
GLUCOSE: 122 mg/dL — AB (ref 70–99)
Potassium: 4.2 mEq/L (ref 3.5–5.1)
SODIUM: 139 meq/L (ref 135–145)
TOTAL PROTEIN: 7.1 g/dL (ref 6.0–8.3)

## 2016-12-13 LAB — CBC WITH DIFFERENTIAL/PLATELET
BASOS ABS: 0.1 10*3/uL (ref 0.0–0.1)
Basophils Relative: 0.6 % (ref 0.0–3.0)
EOS ABS: 0.3 10*3/uL (ref 0.0–0.7)
Eosinophils Relative: 2.7 % (ref 0.0–5.0)
HCT: 43.7 % (ref 36.0–46.0)
Hemoglobin: 14.4 g/dL (ref 12.0–15.0)
LYMPHS ABS: 2.8 10*3/uL (ref 0.7–4.0)
Lymphocytes Relative: 24 % (ref 12.0–46.0)
MCHC: 33 g/dL (ref 30.0–36.0)
MCV: 92.3 fl (ref 78.0–100.0)
MONOS PCT: 7 % (ref 3.0–12.0)
Monocytes Absolute: 0.8 10*3/uL (ref 0.1–1.0)
NEUTROS ABS: 7.7 10*3/uL (ref 1.4–7.7)
NEUTROS PCT: 65.7 % (ref 43.0–77.0)
PLATELETS: 167 10*3/uL (ref 150.0–400.0)
RBC: 4.73 Mil/uL (ref 3.87–5.11)
RDW: 13.1 % (ref 11.5–15.5)
WBC: 11.8 10*3/uL — ABNORMAL HIGH (ref 4.0–10.5)

## 2016-12-13 LAB — LIPID PANEL
CHOL/HDL RATIO: 3
Cholesterol: 119 mg/dL (ref 0–200)
HDL: 41.8 mg/dL (ref >39.00)
LDL Cholesterol: 41 mg/dL (ref 0–99)
NONHDL: 77.07
Triglycerides: 178 mg/dL — ABNORMAL HIGH (ref 0.0–149.0)
VLDL: 35.6 mg/dL (ref 0.0–40.0)

## 2016-12-13 LAB — HEMOGLOBIN A1C: HEMOGLOBIN A1C: 7.5 % — AB (ref 4.6–6.5)

## 2016-12-13 LAB — TSH: TSH: 1.95 u[IU]/mL (ref 0.35–4.50)

## 2016-12-13 MED ORDER — ESCITALOPRAM OXALATE 20 MG PO TABS: 20.0000 mg | ORAL_TABLET | Freq: Every day | ORAL | 1 refills | Status: DC

## 2016-12-13 MED ORDER — VALSARTAN-HYDROCHLOROTHIAZIDE 160-25 MG PO TABS: 1.0000 | ORAL_TABLET | Freq: Every day | ORAL | 1 refills | Status: DC

## 2016-12-13 MED ORDER — SITAGLIP PHOS-METFORMIN HCL ER 50-1000 MG PO TB24: 2.0000 | ORAL_TABLET | Freq: Every day | ORAL | 1 refills | Status: DC

## 2016-12-13 MED ORDER — ONETOUCH DELICA LANCETS FINE MISC: 5 refills | Status: DC

## 2016-12-13 MED ORDER — DAPAGLIFLOZIN PROPANEDIOL 10 MG PO TABS: 10.0000 mg | ORAL_TABLET | Freq: Every day | ORAL | 1 refills | Status: DC

## 2016-12-13 MED ORDER — ATORVASTATIN CALCIUM 10 MG PO TABS: 10.0000 mg | ORAL_TABLET | Freq: Every day | ORAL | 1 refills | Status: DC

## 2016-12-13 MED ORDER — GLUCOSE BLOOD VI STRP: ORAL_STRIP | 5 refills | Status: DC

## 2016-12-13 NOTE — Patient Instructions (Addendum)
Preventive Care 40-64 Years, Female Preventive care refers to lifestyle choices and visits with your health care provider that can promote health and wellness. What does preventive care include?  A yearly physical exam. This is also called an annual well check.  Dental exams once or twice a year.  Routine eye exams. Ask your health care provider how often you should have your eyes checked.  Personal lifestyle choices, including:  Daily care of your teeth and gums.  Regular physical activity.  Eating a healthy diet.  Avoiding tobacco and drug use.  Limiting alcohol use.  Practicing safe sex.  Taking low-dose aspirin daily starting at age 50.  Taking vitamin and mineral supplements as recommended by your health care provider. What happens during an annual well check? The services and screenings done by your health care provider during your annual well check will depend on your age, overall health, lifestyle risk factors, and family history of disease. Counseling  Your health care provider may ask you questions about your:  Alcohol use.  Tobacco use.  Drug use.  Emotional well-being.  Home and relationship well-being.  Sexual activity.  Eating habits.  Work and work environment.  Method of birth control.  Menstrual cycle.  Pregnancy history. Screening  You may have the following tests or measurements:  Height, weight, and BMI.  Blood pressure.  Lipid and cholesterol levels. These may be checked every 5 years, or more frequently if you are over 50 years old.  Skin check.  Lung cancer screening. You may have this screening every year starting at age 55 if you have a 30-pack-year history of smoking and currently smoke or have quit within the past 15 years.  Fecal occult blood test (FOBT) of the stool. You may have this test every year starting at age 50.  Flexible sigmoidoscopy or colonoscopy. You may have a sigmoidoscopy every 5 years or a colonoscopy  every 10 years starting at age 50.  Hepatitis C blood test.  Hepatitis B blood test.  Sexually transmitted disease (STD) testing.  Diabetes screening. This is done by checking your blood sugar (glucose) after you have not eaten for a while (fasting). You may have this done every 1-3 years.  Mammogram. This may be done every 1-2 years. Talk to your health care provider about when you should start having regular mammograms. This may depend on whether you have a family history of breast cancer.  BRCA-related cancer screening. This may be done if you have a family history of breast, ovarian, tubal, or peritoneal cancers.  Pelvic exam and Pap test. This may be done every 3 years starting at age 21. Starting at age 30, this may be done every 5 years if you have a Pap test in combination with an HPV test.  Bone density scan. This is done to screen for osteoporosis. You may have this scan if you are at high risk for osteoporosis. Discuss your test results, treatment options, and if necessary, the need for more tests with your health care provider. Vaccines  Your health care provider may recommend certain vaccines, such as:  Influenza vaccine. This is recommended every year.  Tetanus, diphtheria, and acellular pertussis (Tdap, Td) vaccine. You may need a Td booster every 10 years.  Varicella vaccine. You may need this if you have not been vaccinated.  Zoster vaccine. You may need this after age 60.  Measles, mumps, and rubella (MMR) vaccine. You may need at least one dose of MMR if you were born   in 1957 or later. You may also need a second dose.  Pneumococcal 13-valent conjugate (PCV13) vaccine. You may need this if you have certain conditions and were not previously vaccinated.  Pneumococcal polysaccharide (PPSV23) vaccine. You may need one or two doses if you smoke cigarettes or if you have certain conditions.  Meningococcal vaccine. You may need this if you have certain  conditions.  Hepatitis A vaccine. You may need this if you have certain conditions or if you travel or work in places where you may be exposed to hepatitis A.  Hepatitis B vaccine. You may need this if you have certain conditions or if you travel or work in places where you may be exposed to hepatitis B.  Haemophilus influenzae type b (Hib) vaccine. You may need this if you have certain conditions. Talk to your health care provider about which screenings and vaccines you need and how often you need them. This information is not intended to replace advice given to you by your health care provider. Make sure you discuss any questions you have with your health care provider. Document Released: 11/17/2015 Document Revised: 07/10/2016 Document Reviewed: 08/22/2015 Elsevier Interactive Patient Education  2017 Elsevier Inc.      Pneumococcal Conjugate Vaccine (PCV13) What You Need to Know 1. Why get vaccinated? Vaccination can protect both children and adults from pneumococcal disease. Pneumococcal disease is caused by bacteria that can spread from person to person through close contact. It can cause ear infections, and it can also lead to more serious infections of the:  Lungs (pneumonia),  Blood (bacteremia), and  Covering of the brain and spinal cord (meningitis). Pneumococcal pneumonia is most common among adults. Pneumococcal meningitis can cause deafness and brain damage, and it kills about 1 child in 10 who get it. Anyone can get pneumococcal disease, but children under 69 years of age and adults 65 years and older, people with certain medical conditions, and cigarette smokers are at the highest risk. Before there was a vaccine, the [place] saw:  more than 700 cases of meningitis,  about 13,000 blood infections,  about 5 million ear infections, and  about 200 deaths in children under 5 each year from pneumococcal disease. Since vaccine became available, severe pneumococcal  disease in these children has fallen by 88%. About 18,000 older adults die of pneumococcal disease each year in the [place]. Treatment of pneumococcal infections with penicillin and other drugs is not as effective as it used to be, because some strains of the disease have become resistant to these drugs. This makes prevention of the disease, through vaccination, even more important. 2. PCV13 vaccine Pneumococcal conjugate vaccine (called PCV13) protects against 13 types of pneumococcal bacteria. PCV13 is routinely given to children at 2, 4, 6, and 71-63 months of age. It is also recommended for children and adults 47 to 37 years of age with certain health conditions, and for all adults 72 years of age and older. Your doctor can give you details. 3. Some people should not get this vaccine Anyone who has ever had a life-threatening allergic reaction to a dose of this vaccine, to an earlier pneumococcal vaccine called PCV7, or to any vaccine containing diphtheria toxoid (for example, DTaP), should not get PCV13. Anyone with a severe allergy to any component of PCV13 should not get the vaccine. Tell your doctor if the person being vaccinated has any severe allergies. If the person scheduled for vaccination is not feeling well, your healthcare provider might decide to  reschedule the shot on another day. 4. Risks of a vaccine reaction With any medicine, including vaccines, there is a chance of reactions. These are usually mild and go away on their own, but serious reactions are also possible. Problems reported following PCV13 varied by age and dose in the series. The most common problems reported among children were:  About half became drowsy after the shot, had a temporary loss of appetite, or had redness or tenderness where the shot was given.  About 1 out of 3 had swelling where the shot was given.  About 1 out of 3 had a mild fever, and about 1 in 20 had a fever over 102.45F.  Up to about 8  out of 10 became fussy or irritable. Adults have reported pain, redness, and swelling where the shot was given; also mild fever, fatigue, headache, chills, or muscle pain. Young children who get PCV13 along with inactivated flu vaccine at the same time may be at increased risk for seizures caused by fever. Ask your doctor for more information. Problems that could happen after any vaccine:  People sometimes faint after a medical procedure, including vaccination. Sitting or lying down for about 15 minutes can help prevent fainting, and injuries caused by a fall. Tell your doctor if you feel dizzy, or have vision changes or ringing in the ears.  Some older children and adults get severe pain in the shoulder and have difficulty moving the arm where a shot was given. This happens very rarely.  Any medication can cause a severe allergic reaction. Such reactions from a vaccine are very rare, estimated at about 1 in a million doses, and would happen within a few minutes to a few hours after the vaccination. As with any medicine, there is a very small chance of a vaccine causing a serious injury or death. The safety of vaccines is always being monitored. For more information, visit: http://www.aguilar.org/ 5. What if there is a serious reaction? What should I look for? Look for anything that concerns you, such as signs of a severe allergic reaction, very high fever, or unusual behavior. Signs of a severe allergic reaction can include hives, swelling of the face and throat, difficulty breathing, a fast heartbeat, dizziness, and weakness-usually within a few minutes to a few hours after the vaccination. What should I do?  If you think it is a severe allergic reaction or other emergency that can't wait, call 9-1-1 or get the person to the nearest hospital. Otherwise, call your doctor.  Reactions should be reported to the Vaccine Adverse Event Reporting System (VAERS). Your doctor should file this report,  or you can do it yourself through the VAERS web site at www.vaers.SamedayNews.es, or by calling 873-732-3286.  VAERS does not give medical advice. 6. The National Vaccine Injury Compensation Program The [employer] Vaccine Injury Compensation Program (VICP) is a federal program that was created to compensate people who may have been injured by certain vaccines. Persons who believe they may have been injured by a vaccine can learn about the program and about filing a claim by calling 406-762-6512 or visiting the [employer] website at GoldCloset.com.ee. There is a time limit to file a claim for compensation. 7. How can I learn more?  Ask your healthcare provider. He or she can give you the vaccine package insert or suggest other sources of information.  Call your local or state health department.  Contact the Centers for Disease Control and Prevention (CDC):  Call 5408846711 (1-800-CDC-INFO) or  Visit  CDC's website at http://hunter.com/ Vaccine Information Statement, PCV13 Vaccine (09/08/2014) This information is not intended to replace advice given to you by your health care provider. Make sure you discuss any questions you have with your health care provider. Document Released: 08/18/2006 Document Revised: 07/11/2016 Document Reviewed: 07/11/2016 Elsevier Interactive Patient Education  2017 [employer].

## 2016-12-13 NOTE — Progress Notes (Signed)
Pre visit review using our clinic review tool, if applicable. No additional management support is needed unless otherwise documented below in the visit note. Subjective:   I acted as a Education administrator for Dr. Carollee Herter.  [name], CMA   Jacqueline Duran is a 66 y.o. female and is here for a comprehensive physical exam. The patient reports no problems. Pt also here to f/u dm, htn and choleterol No complaints.    HPI HYPERTENSION   Blood pressure range-not checking   Chest pain- no      Dyspnea- no Lightheadedness- no   Edema- no  Other side effects - no   Medication compliance: good Low salt diet- yes    DIABETES    Blood Sugar ranges-good per pt  Polyuria- no New Visual problems- no  Hypoglycemic symptoms- no  Other side effects-no Medication compliance - good Last eye exam- done Foot exam- today   HYPERLIPIDEMIA  Medication compliance- good RUQ pain- no  Muscle aches- no     Social History   Social History  . Marital status: Married    Spouse name: N/A  . Number of children: N/A  . Years of education: N/A   Occupational History  . unemployed    Social History Main Topics  . Smoking status: Never Smoker  . Smokeless tobacco: Never Used  . Alcohol use No  . Drug use: No  . Sexual activity: Not Currently    Partners: Male   Other Topics Concern  . Not on file   Social History Narrative   Live at home alone      Health Maintenance  Topic Date Due  . PNEUMOCOCCAL POLYSACCHARIDE VACCINE (2) 06/04/2017  . HEMOGLOBIN A1C  06/12/2017  . OPHTHALMOLOGY EXAM  11/11/2017  . FOOT EXAM  12/13/2017  . MAMMOGRAM  07/12/2018  . TETANUS/TDAP  05/17/2019  . COLONOSCOPY  06/12/2020  . INFLUENZA VACCINE  Completed  . ZOSTAVAX  Completed  . Hepatitis C Screening  Completed  . HIV Screening  Completed    The following portions of the patient's history were reviewed and updated as appropriate:  She  has a past medical history of Colon polyps; Deafness; Diabetes mellitus;  Hyperlipidemia; and Hypertension. She  does not have any pertinent problems on file. She  has a past surgical history that includes Abdominal hysterectomy; Tonsillectomy; Cervical fusion (07/2004); and Colonoscopy (8/11). Her family history includes Alcohol abuse in her other; Cancer in her father; Clotting disorder in her sister; Coronary artery disease in her mother; Factor V Leiden deficiency in her other; Heart disease in her other; Leukemia in her maternal uncle; Lung cancer in her father. She  reports that she has never smoked. She has never used smokeless tobacco. She reports that she does not drink alcohol or use drugs. She has a current medication list which includes the following prescription(s): albuterol, ascorbic acid, aspirin, atorvastatin, dapagliflozin propanediol, escitalopram, fluticasone, folic acid, glucose blood, lactobacillus, magnesium, megared omega-3 krill oil, multivitamin, onetouch delica lancets fine, sitagliptin-metformin hcl, turmeric, and valsartan-hydrochlorothiazide. Current Outpatient Prescriptions on File Prior to Visit  Medication Sig Dispense Refill  . albuterol (PROVENTIL HFA;VENTOLIN HFA) 108 (90 Base) MCG/ACT inhaler Inhale 2 puffs into the lungs every 6 (six) hours as needed for wheezing or shortness of breath. 1 Inhaler 2  . ascorbic acid (VITAMIN C) 500 MG tablet Take 1,000 mg by mouth daily.     Marland Kitchen aspirin 81 MG tablet Take 81 mg by mouth daily.      . fluticasone (FLOVENT  HFA) 110 MCG/ACT inhaler Inhale 2 puffs into the lungs 2 (two) times daily. 1 Inhaler 0  . folic acid (FOLVITE) 1 MG tablet TAKE 1 TABLET(1 MG) BY MOUTH DAILY 90 tablet 1  . Lactobacillus (PROBIOTIC ACIDOPHILUS PO) Take by mouth.    . Magnesium 500 MG TABS Take 1 tablet by mouth daily.    Marland Kitchen MEGARED OMEGA-3 KRILL OIL 500 MG CAPS Take 1 capsule by mouth daily.    . Multiple Vitamin (MULTIVITAMIN) tablet Take 1 tablet by mouth daily.      . Turmeric 500 MG CAPS Take 1 capsule by mouth daily.      No current facility-administered medications on file prior to visit.    She is allergic to amlodipine besylate and other..  Review of Systems Review of Systems  Constitutional: Negative for activity change, appetite change and fatigue.  HENT: Negative for hearing loss, congestion, tinnitus and ear discharge.  dentist q23m Eyes: Negative for visual disturbance (see optho q1y -- vision corrected to 20/20 with glasses).  Respiratory: Negative for cough, chest tightness and shortness of breath.   Cardiovascular: Negative for chest pain, palpitations and leg swelling.  Gastrointestinal: Negative for abdominal pain, diarrhea, constipation and abdominal distention.  Genitourinary: Negative for urgency, frequency, decreased urine volume and difficulty urinating.  Musculoskeletal: Negative for back pain, arthralgias and gait problem.  Skin: Negative for color change, pallor and rash.  Neurological: Negative for dizziness, light-headedness, numbness and headaches.  Hematological: Negative for adenopathy. Does not bruise/bleed easily.  Psychiatric/Behavioral: Negative for suicidal ideas, confusion, sleep disturbance, self-injury, dysphoric mood, decreased concentration and agitation.       Objective:    BP 130/70 (BP Location: Left Arm, Cuff Size: Large)   Pulse 61   Temp 98.5 F (36.9 C) (Oral)   Resp 16   Ht 5' 4.5" (1.638 m)   Wt 255 lb 3.2 oz (115.8 kg)   SpO2 94%   BMI 43.13 kg/m  General appearance: alert, cooperative, appears stated age and no distress Head: Normocephalic, without obvious abnormality, atraumatic Eyes: conjunctivae/corneas clear. PERRL, EOM's intact. Fundi benign. Ears: normal TM's and external ear canals both ears Nose: Nares normal. Septum midline. Mucosa normal. No drainage or sinus tenderness. Throat: lips, mucosa, and tongue normal; teeth and gums normal Neck: no adenopathy, no carotid bruit, no JVD, supple, symmetrical, trachea midline and thyroid not  enlarged, symmetric, no tenderness/mass/nodules Back: symmetric, no curvature. ROM normal. No CVA tenderness. Lungs: clear to auscultation bilaterally Breasts: normal appearance, no masses or tenderness Heart: regular rate and rhythm, S1, S2 normal, no murmur, click, rub or gallop Abdomen: soft, non-tender; bowel sounds normal; no masses,  no organomegaly Pelvic: not indicated; status post hysterectomy, negative ROS Extremities: extremities normal, atraumatic, no cyanosis or edema Pulses: 2+ and symmetric Skin: Skin color, texture, turgor normal. No rashes or lesions Lymph nodes: Cervical, supraclavicular, and axillary nodes normal. Neurologic: Alert and oriented X 3, normal strength and tone. Normal symmetric reflexes. Normal coordination and gait   Sensory exam of the foot is normal, tested with the monofilament. Good pulses, no lesions or ulcers, good peripheral pulses.  Assessment:    Healthy female exam.      Plan:    ghm utd Check labs See After Visit Summary for Counseling Recommendations    1. Preventative health care See above  2. DM (diabetes mellitus) type II uncontrolled, periph vascular disorder (HCC) Check labs  - Hemoglobin A1c - TSH - CBC with Differential/Platelet - POCT Urinalysis Dipstick (Automated) -  dapagliflozin propanediol (FARXIGA) 10 MG TABS tablet; Take 10 mg by mouth daily.  Dispense: 90 tablet; Refill: 1 - SitaGLIPtin-MetFORMIN HCl 50-1000 MG TB24; Take 2 tablets by mouth daily.  Dispense: 180 tablet; Refill: 1  3. Uncontrolled type 2 diabetes mellitus without complication, without long-term current use of insulin ([facility]) Check labs - [place]; Check blood sugar once daily. Dx:E11.9  Dispense: 100 each; Refill: 5 - glucose blood (ONE TOUCH ULTRA TEST) test strip; Check blood sugar once daily. Dx:E11.9  Dispense: 100 each; Refill: 5 - dapagliflozin propanediol (FARXIGA) 10 MG TABS tablet; Take 10 mg by mouth daily.  Dispense:  90 tablet; Refill: 1 - SitaGLIPtin-MetFORMIN HCl 50-1000 MG TB24; Take 2 tablets by mouth daily.  Dispense: 180 tablet; Refill: 1  4. Essential hypertension stable - Comprehensive metabolic panel - CBC with Differential/Platelet - POCT Urinalysis Dipstick (Automated) - valsartan-hydrochlorothiazide (DIOVAN-HCT) 160-25 MG tablet; Take 1 tablet by mouth daily.  Dispense: 90 tablet; Refill: 1  5. Hyperlipidemia, unspecified hyperlipidemia type Check labs  - atorvastatin (LIPITOR) 10 MG tablet; Take 1 tablet (10 mg total) by mouth daily.  Dispense: 90 tablet; Refill: 1  6. Hyperlipidemia LDL goal <70   - Lipid panel - POCT Urinalysis Dipstick (Automated) - atorvastatin (LIPITOR) 10 MG tablet; Take 1 tablet (10 mg total) by mouth daily.  Dispense: 90 tablet; Refill: 1  7. Generalized anxiety disorder stable - TSH - escitalopram (LEXAPRO) 20 MG tablet; Take 1 tablet (20 mg total) by mouth daily.  Dispense: 90 tablet; Refill: 1  8. Need for 23-polyvalent pneumococcal polysaccharide vaccine   - Pneumococcal polysaccharide vaccine 23-valent greater than or equal to 2yo subcutaneous/IM

## 2016-12-14 ENCOUNTER — Encounter: Payer: Self-pay | Admitting: Family Medicine

## 2016-12-26 ENCOUNTER — Other Ambulatory Visit: Payer: Self-pay | Admitting: Family Medicine

## 2016-12-26 ENCOUNTER — Telehealth: Payer: Self-pay

## 2016-12-26 DIAGNOSIS — E119 Type 2 diabetes mellitus without complications: Secondary | ICD-10-CM

## 2016-12-26 DIAGNOSIS — E785 Hyperlipidemia, unspecified: Secondary | ICD-10-CM

## 2016-12-26 MED ORDER — LIRAGLUTIDE 18 MG/3ML ~~LOC~~ SOPN: PEN_INJECTOR | SUBCUTANEOUS | 3 refills | Status: DC

## 2016-12-26 MED ORDER — METFORMIN HCL ER (MOD) 1000 MG PO TB24: 1000.0000 mg | ORAL_TABLET | Freq: Every day | ORAL | 3 refills | Status: DC

## 2016-12-26 NOTE — Telephone Encounter (Signed)
PA initiated via Covermymeds; KEY: [place]. Awaiting determination.

## 2016-12-30 NOTE — Telephone Encounter (Signed)
I already answered this-- its fine as long as pt is ok with it

## 2016-12-30 NOTE — Telephone Encounter (Signed)
Glucophage XR not available in 1000mg  formulation. Please advise?

## 2016-12-30 NOTE — Telephone Encounter (Signed)
As long as pt ok with it

## 2016-12-30 NOTE — Telephone Encounter (Signed)
PA denied for Glumetza. Insurance covers Glucophage XR, okay to switch?

## 2016-12-31 NOTE — Telephone Encounter (Signed)
MyChart message sent to Pt.

## 2017-01-02 MED ORDER — LIRAGLUTIDE 18 MG/3ML ~~LOC~~ SOPN: PEN_INJECTOR | SUBCUTANEOUS | 3 refills | Status: DC

## 2017-01-02 MED ORDER — METFORMIN HCL 1000 MG PO TABS: 1000.0000 mg | ORAL_TABLET | Freq: Two times a day (BID) | ORAL | 5 refills | Status: DC

## 2017-01-02 NOTE — Addendum Note (Signed)
Addended byDamita Dunnings D on: 01/02/2017 10:44 AM   Modules accepted: Orders

## 2017-01-02 NOTE — Addendum Note (Signed)
Addended byDamita Dunnings D on: 01/02/2017 10:46 AM   Modules accepted: Orders

## 2017-01-02 NOTE — Telephone Encounter (Signed)
Pt okay to switch to covered Glucophage. Rx sent. Pt informed via MyChart.

## 2017-01-07 ENCOUNTER — Other Ambulatory Visit: Payer: Self-pay

## 2017-01-07 ENCOUNTER — Ambulatory Visit (INDEPENDENT_AMBULATORY_CARE_PROVIDER_SITE_OTHER): Payer: 59

## 2017-01-07 DIAGNOSIS — E1165 Type 2 diabetes mellitus with hyperglycemia: Secondary | ICD-10-CM

## 2017-01-07 DIAGNOSIS — E1151 Type 2 diabetes mellitus with diabetic peripheral angiopathy without gangrene: Secondary | ICD-10-CM | POA: Diagnosis not present

## 2017-01-07 DIAGNOSIS — IMO0002 Reserved for concepts with insufficient information to code with codable children: Secondary | ICD-10-CM

## 2017-01-07 NOTE — Patient Instructions (Signed)
And start Victoza 0.6 mg qd x 1 week then up to 1.2 mg qd x 1 week than 1. 8 mg qd---

## 2017-01-07 NOTE — Progress Notes (Addendum)
Pre visit review using our clinic tool,if applicable. No additional management support is needed unless otherwise documented below in the visit note.   Patient in today per orders dated 12/13/16 from Dr. Roma Schanz, DO.  Patient instructed on  how to use Victoza injection system with successful return demonstration by patient. Per order from Dr. Carollee Herter patient to start 0.6mg  qd x 1 week then increase to 1.2mg  x 1 week then increase to 1.8mg  daily. Written instructions given to patient. Patient given log book to record blood sugar readings as well as diabetic diet information booklet.   Appointment has been scheduled for patient to return for labs.   Reviewed  [name], DO

## 2017-01-27 ENCOUNTER — Other Ambulatory Visit: Payer: Self-pay | Admitting: Family Medicine

## 2017-01-27 DIAGNOSIS — E1165 Type 2 diabetes mellitus with hyperglycemia: Principal | ICD-10-CM

## 2017-01-27 DIAGNOSIS — IMO0001 Reserved for inherently not codable concepts without codable children: Secondary | ICD-10-CM

## 2017-02-13 ENCOUNTER — Encounter: Payer: Self-pay | Admitting: Family Medicine

## 2017-02-13 ENCOUNTER — Other Ambulatory Visit: Payer: Self-pay | Admitting: Family Medicine

## 2017-02-13 MED ORDER — INSULIN PEN NEEDLE 32G X 6 MM MISC: 4 refills | Status: DC

## 2017-02-26 ENCOUNTER — Telehealth: Payer: Self-pay | Admitting: *Deleted

## 2017-02-26 MED ORDER — METFORMIN HCL 1000 MG PO TABS: 1000.0000 mg | ORAL_TABLET | Freq: Two times a day (BID) | ORAL | 1 refills | Status: DC

## 2017-02-26 NOTE — Telephone Encounter (Signed)
Faxed refill request received from [facility] for 90-day supply Metformin HCL 1000 mg tab Last filled by MD on 01/02/17,  D/C PREVIOUS SCRIPTS FOR THIS MEDICATION Last AEX - 12/13/16 Next AEX - 6-Mths Refill sent per [facility] refill protocol/SLS

## 2017-03-11 ENCOUNTER — Ambulatory Visit: Payer: 59 | Admitting: Adult Health

## 2017-03-13 ENCOUNTER — Encounter: Payer: Self-pay | Admitting: Family Medicine

## 2017-03-13 MED ORDER — LIRAGLUTIDE 18 MG/3ML ~~LOC~~ SOPN: PEN_INJECTOR | SUBCUTANEOUS | 3 refills | Status: DC

## 2017-03-13 MED ORDER — METFORMIN HCL 1000 MG PO TABS: 1000.0000 mg | ORAL_TABLET | Freq: Two times a day (BID) | ORAL | 1 refills | Status: DC

## 2017-04-08 ENCOUNTER — Other Ambulatory Visit (INDEPENDENT_AMBULATORY_CARE_PROVIDER_SITE_OTHER): Payer: 59

## 2017-04-08 DIAGNOSIS — E119 Type 2 diabetes mellitus without complications: Secondary | ICD-10-CM

## 2017-04-08 DIAGNOSIS — E785 Hyperlipidemia, unspecified: Secondary | ICD-10-CM

## 2017-04-08 LAB — COMPREHENSIVE METABOLIC PANEL
ALBUMIN: 3.9 g/dL (ref 3.5–5.2)
ALK PHOS: 105 U/L (ref 39–117)
ALT: 28 U/L (ref 0–35)
AST: 25 U/L (ref 0–37)
BILIRUBIN TOTAL: 0.6 mg/dL (ref 0.2–1.2)
BUN: 14 mg/dL (ref 6–23)
CO2: 29 mEq/L (ref 19–32)
CREATININE: 0.74 mg/dL (ref 0.40–1.20)
Calcium: 9.3 mg/dL (ref 8.4–10.5)
Chloride: 102 mEq/L (ref 96–112)
GFR: 84.08 mL/min (ref >60.00)
Glucose, Bld: 140 mg/dL — ABNORMAL HIGH (ref 70–99)
Potassium: 3.7 mEq/L (ref 3.5–5.1)
SODIUM: 139 meq/L (ref 135–145)
TOTAL PROTEIN: 7.3 g/dL (ref 6.0–8.3)

## 2017-04-08 LAB — LIPID PANEL
CHOLESTEROL: 115 mg/dL (ref 0–200)
HDL: 41.7 mg/dL (ref >39.00)
LDL Cholesterol: 41 mg/dL (ref 0–99)
NonHDL: 73.47
TRIGLYCERIDES: 161 mg/dL — AB (ref 0.0–149.0)
Total CHOL/HDL Ratio: 3
VLDL: 32.2 mg/dL (ref 0.0–40.0)

## 2017-04-08 LAB — HEMOGLOBIN A1C: HEMOGLOBIN A1C: 6.7 % — AB (ref 4.6–6.5)

## 2017-04-09 ENCOUNTER — Other Ambulatory Visit: Payer: 59

## 2017-04-21 ENCOUNTER — Ambulatory Visit: Payer: 59 | Admitting: Adult Health

## 2017-04-28 ENCOUNTER — Ambulatory Visit (INDEPENDENT_AMBULATORY_CARE_PROVIDER_SITE_OTHER): Payer: 59 | Admitting: Family Medicine

## 2017-04-28 ENCOUNTER — Encounter: Payer: Self-pay | Admitting: Family Medicine

## 2017-04-28 ENCOUNTER — Telehealth: Payer: Self-pay | Admitting: *Deleted

## 2017-04-28 ENCOUNTER — Other Ambulatory Visit: Payer: Self-pay | Admitting: Family Medicine

## 2017-04-28 ENCOUNTER — Ambulatory Visit (HOSPITAL_BASED_OUTPATIENT_CLINIC_OR_DEPARTMENT_OTHER)
Admission: RE | Admit: 2017-04-28 | Discharge: 2017-04-28 | Disposition: A | Discharge status: Home / Self Care | Payer: 59 | Source: Ambulatory Visit | Attending: Family Medicine | Admitting: Family Medicine

## 2017-04-28 VITALS — BP 110/76 | HR 76 | Temp 98.6°F | Resp 16 | Ht 64.5 in | Wt 251.4 lb

## 2017-04-28 DIAGNOSIS — K5730 Diverticulosis of large intestine without perforation or abscess without bleeding: Secondary | ICD-10-CM | POA: Insufficient documentation

## 2017-04-28 DIAGNOSIS — R911 Solitary pulmonary nodule: Secondary | ICD-10-CM | POA: Diagnosis not present

## 2017-04-28 DIAGNOSIS — R1011 Right upper quadrant pain: Secondary | ICD-10-CM

## 2017-04-28 DIAGNOSIS — K853 Drug induced acute pancreatitis without necrosis or infection: Secondary | ICD-10-CM

## 2017-04-28 DIAGNOSIS — K760 Fatty (change of) liver, not elsewhere classified: Secondary | ICD-10-CM | POA: Insufficient documentation

## 2017-04-28 DIAGNOSIS — Q453 Other congenital malformations of pancreas and pancreatic duct: Secondary | ICD-10-CM | POA: Diagnosis not present

## 2017-04-28 LAB — CBC WITH DIFFERENTIAL/PLATELET
BASOS ABS: 0.1 10*3/uL (ref 0.0–0.1)
Basophils Relative: 0.4 % (ref 0.0–3.0)
Eosinophils Absolute: 0.3 10*3/uL (ref 0.0–0.7)
Eosinophils Relative: 1.6 % (ref 0.0–5.0)
HCT: 40 % (ref 36.0–46.0)
Hemoglobin: 13.4 g/dL (ref 12.0–15.0)
LYMPHS ABS: 2.5 10*3/uL (ref 0.7–4.0)
Lymphocytes Relative: 13 % (ref 12.0–46.0)
MCHC: 33.6 g/dL (ref 30.0–36.0)
MCV: 92.7 fl (ref 78.0–100.0)
MONO ABS: 1.2 10*3/uL — AB (ref 0.1–1.0)
MONOS PCT: 6.5 % (ref 3.0–12.0)
NEUTROS ABS: 14.9 10*3/uL — AB (ref 1.4–7.7)
NEUTROS PCT: 78.5 % — AB (ref 43.0–77.0)
PLATELETS: NORMAL 10*3/uL (ref 150.0–400.0)
RBC: 4.32 Mil/uL (ref 3.87–5.11)
RDW: 13.3 % (ref 11.5–15.5)
WBC: 19 10*3/uL (ref 4.0–10.5)

## 2017-04-28 LAB — COMPREHENSIVE METABOLIC PANEL
ALT: 26 U/L (ref 0–35)
AST: 20 U/L (ref 0–37)
Albumin: 3.7 g/dL (ref 3.5–5.2)
Alkaline Phosphatase: 107 U/L (ref 39–117)
BILIRUBIN TOTAL: 0.8 mg/dL (ref 0.2–1.2)
BUN: 9 mg/dL (ref 6–23)
CO2: 33 mEq/L — ABNORMAL HIGH (ref 19–32)
CREATININE: 0.71 mg/dL (ref 0.40–1.20)
Calcium: 9.2 mg/dL (ref 8.4–10.5)
Chloride: 99 mEq/L (ref 96–112)
GFR: 88.17 mL/min (ref >60.00)
GLUCOSE: 181 mg/dL — AB (ref 70–99)
Potassium: 3.8 mEq/L (ref 3.5–5.1)
Sodium: 137 mEq/L (ref 135–145)
Total Protein: 7.1 g/dL (ref 6.0–8.3)

## 2017-04-28 LAB — LIPASE: Lipase: 184 U/L — ABNORMAL HIGH (ref 11.0–59.0)

## 2017-04-28 LAB — AMYLASE: AMYLASE: 65 U/L (ref 27–131)

## 2017-04-28 MED ORDER — IOPAMIDOL (ISOVUE-300) INJECTION 61%
100.0000 mL | Freq: Once | INTRAVENOUS | Status: AC | PRN
  Administered 2017-04-28: 100 mL via INTRAVENOUS

## 2017-04-28 MED ORDER — CIPROFLOXACIN HCL 500 MG PO TABS: 500.0000 mg | ORAL_TABLET | Freq: Two times a day (BID) | ORAL | 0 refills | Status: DC

## 2017-04-28 NOTE — Patient Instructions (Signed)

## 2017-04-28 NOTE — Progress Notes (Signed)
Patient ID: Jacqueline Duran, female   DOB: 06/08/1951, 66 y.o.   MRN: [id number]     Subjective:  I acted as a Education administrator for Dr. Carollee Herter.  [name], [place]   Patient ID: Jacqueline Duran, female    DOB: 05-Apr-1951, 66 y.o.   MRN: [id number]  Chief Complaint  Patient presents with  . rib pain right side    spasms, cramps, for 1 week    HPI  Patient is in today for right side rib pain for 1 week. She has cramps and spasms.  Has been taking ibuprofen which has not helped.  Has had some gallbladder problems in the past.  Patient Care Team: Carollee Herter, Alferd Apa, DO as PCP - General   Past Medical History:  Diagnosis Date  . Colon polyps   . Deafness    Right Ear  . Diabetes mellitus   . Hyperlipidemia   . Hypertension     Past Surgical History:  Procedure Laterality Date  . ABDOMINAL HYSTERECTOMY    . CERVICAL FUSION  07/2004  . COLONOSCOPY  8/11   Hyperplastic polyps  . TONSILLECTOMY      Family History  Problem Relation Age of Onset  . Lung cancer Father   . Cancer Father        LUNG  . Heart attack Unknown   . Alcohol abuse Unknown   . Stroke Unknown        Female <50/Female <60  . Clotting disorder Sister        Factor V  . Coronary artery disease Mother   . Leukemia Maternal Uncle   . Heart disease Other   . Factor V Leiden deficiency Other        THROMBOEBOLISM CLOTTING DISORDER  . Alcohol abuse Other     Social History   Social History  . Marital status: Married    Spouse name: N/A  . Number of children: N/A  . Years of education: N/A   Occupational History  . unemployed    Social History Main Topics  . Smoking status: Never Smoker  . Smokeless tobacco: Never Used  . Alcohol use No  . Drug use: No  . Sexual activity: Not Currently    Partners: Male   Other Topics Concern  . Not on file   Social History Narrative   Live at home alone       Outpatient Medications Prior to Visit  Medication Sig Dispense Refill  . albuterol (PROVENTIL  HFA;VENTOLIN HFA) 108 (90 Base) MCG/ACT inhaler Inhale 2 puffs into the lungs every 6 (six) hours as needed for wheezing or shortness of breath. 1 Inhaler 2  . ascorbic acid (VITAMIN C) 500 MG tablet Take 1,000 mg by mouth daily.     Marland Kitchen aspirin 81 MG tablet Take 81 mg by mouth daily.      Marland Kitchen atorvastatin (LIPITOR) 10 MG tablet Take 1 tablet (10 mg total) by mouth daily. 90 tablet 1  . dapagliflozin propanediol (FARXIGA) 10 MG TABS tablet Take 10 mg by mouth daily. 90 tablet 1  . escitalopram (LEXAPRO) 20 MG tablet Take 1 tablet (20 mg total) by mouth daily. 90 tablet 1  . fluticasone (FLOVENT HFA) 110 MCG/ACT inhaler Inhale 2 puffs into the lungs 2 (two) times daily. 1 Inhaler 0  . folic acid (FOLVITE) 1 MG tablet TAKE 1 TABLET(1 MG) BY MOUTH DAILY 90 tablet 1  . glucose blood (ONE TOUCH ULTRA TEST) test strip Check blood sugar once daily.  Dx:E11.9 100 each 5  . Insulin Pen Needle (NOVOFINE) 32G X 6 MM MISC To use with victoza 50 each 4  . Lactobacillus (PROBIOTIC ACIDOPHILUS PO) Take by mouth.    . liraglutide (VICTOZA) 18 MG/3ML SOPN Start  0.6 mg qd x 1 week then up to 1.2 mg qd x 1 week than 1. 8 mg qd 3 mL 3  . Magnesium 500 MG TABS Take 1 tablet by mouth daily.    Marland Kitchen MEGARED OMEGA-3 KRILL OIL 500 MG CAPS Take 1 capsule by mouth daily.    . metFORMIN (GLUCOPHAGE) 1000 MG tablet Take 1 tablet (1,000 mg total) by mouth 2 (two) times daily with a meal. 180 tablet 1  . Multiple Vitamin (MULTIVITAMIN) tablet Take 1 tablet by mouth daily.      [name] DELICA LANCETS FINE MISC Check blood sugar once daily. Dx:E11.9 100 each 5  . Turmeric 500 MG CAPS Take 1 capsule by mouth daily.    . valsartan-hydrochlorothiazide (DIOVAN-HCT) 160-25 MG tablet Take 1 tablet by mouth daily. 90 tablet 1   No facility-administered medications prior to visit.     Allergies  Allergen Reactions  . Amlodipine Besylate     REACTION: swelling in feet and ankles/rash/pain  . Other Itching    INVOKANA    Review of  Systems  Constitutional: Negative for fever and malaise/fatigue.  HENT: Negative for congestion.   Eyes: Negative for blurred vision.  Respiratory: Negative for cough and shortness of breath.   Cardiovascular: Negative for chest pain, palpitations and leg swelling.  Gastrointestinal: Positive for abdominal pain and nausea. Negative for vomiting.  Musculoskeletal: Negative for back pain.       Right side rib pain  Skin: Negative for rash.  Neurological: Negative for loss of consciousness and headaches.       Objective:    Physical Exam  Constitutional: She is oriented to person, place, and time. She appears well-developed and well-nourished.  HENT:  Head: Normocephalic and atraumatic.  Eyes: Conjunctivae and EOM are normal.  Neck: Normal range of motion. Neck supple. No JVD present. Carotid bruit is not present. No thyromegaly present.  Cardiovascular: Normal rate, regular rhythm and normal heart sounds.   No murmur heard. Pulmonary/Chest: Effort normal and breath sounds normal. No respiratory distress. She has no wheezes. She has no rales. She exhibits no tenderness.  Abdominal: There is tenderness in the right upper quadrant. There is guarding. There is no rigidity and no rebound.  Musculoskeletal: She exhibits no edema.  Neurological: She is alert and oriented to person, place, and time.  Psychiatric: She has a normal mood and affect.  Nursing note and vitals reviewed.   BP 110/76 (BP Location: Left Wrist, Cuff Size: Normal)   Pulse 76   Temp 98.6 F (37 C) (Oral)   Resp 16   Ht 5' 4.5" (1.638 m)   Wt 251 lb 6.4 oz (114 kg)   SpO2 94%   BMI 42.49 kg/m  Wt Readings from Last 3 Encounters:  04/28/17 251 lb 6.4 oz (114 kg)  12/13/16 255 lb 3.2 oz (115.8 kg)  09/10/16 255 lb 12.8 oz (116 kg)   BP Readings from Last 3 Encounters:  04/28/17 110/76  12/13/16 130/70  09/10/16 110/60     Immunization History  Administered Date(s) Administered  . Influenza Split  08/06/2012  . Influenza Whole 11/04/2000, 08/19/2007, 07/19/2009  . Influenza,inj,Quad PF,36+ Mos 08/16/2013, 07/15/2014, 08/13/2016  . Influenza-Unspecified 07/06/2015  . Pneumococcal Polysaccharide-23 06/04/2012, 12/13/2016  .  Td 07/06/1999, 05/16/2009  . Zoster 07/15/2014    Health Maintenance  Topic Date Due  . PNEUMOCOCCAL POLYSACCHARIDE VACCINE (2) 06/04/2017  . INFLUENZA VACCINE  06/04/2017  . HEMOGLOBIN A1C  10/08/2017  . OPHTHALMOLOGY EXAM  11/11/2017  . FOOT EXAM  12/13/2017  . MAMMOGRAM  07/12/2018  . TETANUS/TDAP  05/17/2019  . COLONOSCOPY  06/12/2020  . Hepatitis C Screening  Completed  . HIV Screening  Completed    Lab Results  Component Value Date   WBC 19.0 Repeated and verified X2. (HH) 04/28/2017   HGB 13.4 04/28/2017   HCT 40.0 04/28/2017   PLT 207.0 Platelet estimate appears normal. 04/28/2017   GLUCOSE 181 (H) 04/28/2017   CHOL 115 04/08/2017   TRIG 161.0 (H) 04/08/2017   HDL 41.70 04/08/2017   LDLCALC 41 04/08/2017   ALT 26 04/28/2017   AST 20 04/28/2017   NA 137 04/28/2017   K 3.8 04/28/2017   CL 99 04/28/2017   CREATININE 0.71 04/28/2017   BUN 9 04/28/2017   CO2 33 (H) 04/28/2017   TSH 1.95 12/13/2016   HGBA1C 6.7 (H) 04/08/2017   MICROALBUR <0.7 03/15/2016    Lab Results  Component Value Date   TSH 1.95 12/13/2016   Lab Results  Component Value Date   WBC 19.0 Repeated and verified X2. (HH) 04/28/2017   HGB 13.4 04/28/2017   HCT 40.0 04/28/2017   MCV 92.7 04/28/2017   PLT 207.0 Platelet estimate appears normal. 04/28/2017   Lab Results  Component Value Date   NA 137 04/28/2017   K 3.8 04/28/2017   CO2 33 (H) 04/28/2017   GLUCOSE 181 (H) 04/28/2017   BUN 9 04/28/2017   CREATININE 0.71 04/28/2017   BILITOT 0.8 04/28/2017   ALKPHOS 107 04/28/2017   AST 20 04/28/2017   ALT 26 04/28/2017   PROT 7.1 04/28/2017   ALBUMIN 3.7 04/28/2017   CALCIUM 9.2 04/28/2017   GFR 88.17 04/28/2017   Lab Results  Component Value Date    CHOL 115 04/08/2017   Lab Results  Component Value Date   HDL 41.70 04/08/2017   Lab Results  Component Value Date   LDLCALC 41 04/08/2017   Lab Results  Component Value Date   TRIG 161.0 (H) 04/08/2017   Lab Results  Component Value Date   CHOLHDL 3 04/08/2017   Lab Results  Component Value Date   HGBA1C 6.7 (H) 04/08/2017         Assessment & Plan:   Problem List Items Addressed This Visit      Unprioritized   Drug-induced acute pancreatitis    Mild See CT and labs cipro sent to pharmacy--- pt prefers not to go to ER She was not toxic on exam Pt to rto tomorrow for repeat labs  She agrees to go to er if symptoms worsen Clear liquid diet      Relevant Medications   ciprofloxacin (CIPRO) 500 MG tablet   Other Relevant Orders   CBC with Differential/Platelet   Comprehensive metabolic panel   Amylase   Lipase   POCT Urinalysis Dipstick (Automated)   RUQ pain - Primary   Relevant Orders   US Abdomen Complete (Completed)   Comprehensive metabolic panel (Completed)   CBC with Differential/Platelet (Completed)   Amylase (Completed)   Lipase (Completed)      I am having Ms. Carrozza maintain her ascorbic acid, aspirin, multivitamin, Magnesium, MEGARED OMEGA-3 KRILL OIL, Lactobacillus (PROBIOTIC ACIDOPHILUS PO), Turmeric, fluticasone, albuterol, ONETOUCH DELICA LANCETS FINE, glucose blood,  valsartan-hydrochlorothiazide, dapagliflozin propanediol, atorvastatin, escitalopram, folic acid, Insulin Pen Needle, metFORMIN, liraglutide, and ciprofloxacin.  Meds ordered this encounter  Medications  . DISCONTD: ciprofloxacin (CIPRO) 500 MG tablet    Sig: Take 1 tablet (500 mg total) by mouth 2 (two) times daily.    Dispense:  20 tablet    Refill:  0  . ciprofloxacin (CIPRO) 500 MG tablet    Sig: Take 1 tablet (500 mg total) by mouth 2 (two) times daily.    Dispense:  20 tablet    Refill:  0    CMA served as scribe during this visit. History, Physical and Plan  performed by medical provider. Documentation and orders reviewed and attested to.  [name], DO

## 2017-04-28 NOTE — Assessment & Plan Note (Signed)
Mild See CT and labs cipro sent to pharmacy--- pt prefers not to go to ER She was not toxic on exam Pt to rto tomorrow for repeat labs  She agrees to go to er if symptoms worsen Clear liquid diet

## 2017-04-28 NOTE — Telephone Encounter (Signed)
Mickel Baas from lab called about patient.  White count is at 19.  Please advise.

## 2017-04-29 ENCOUNTER — Other Ambulatory Visit (INDEPENDENT_AMBULATORY_CARE_PROVIDER_SITE_OTHER): Payer: 59

## 2017-04-29 DIAGNOSIS — K853 Drug induced acute pancreatitis without necrosis or infection: Secondary | ICD-10-CM | POA: Diagnosis not present

## 2017-04-29 LAB — CBC WITH DIFFERENTIAL/PLATELET
BASOS PCT: 0.4 % (ref 0.0–3.0)
Basophils Absolute: 0.1 10*3/uL (ref 0.0–0.1)
EOS PCT: 2.7 % (ref 0.0–5.0)
Eosinophils Absolute: 0.4 10*3/uL (ref 0.0–0.7)
HCT: 39.6 % (ref 36.0–46.0)
Hemoglobin: 13.3 g/dL (ref 12.0–15.0)
LYMPHS ABS: 1.9 10*3/uL (ref 0.7–4.0)
Lymphocytes Relative: 11.8 % — ABNORMAL LOW (ref 12.0–46.0)
MCHC: 33.5 g/dL (ref 30.0–36.0)
MCV: 93.3 fl (ref 78.0–100.0)
MONO ABS: 1.1 10*3/uL — AB (ref 0.1–1.0)
MONOS PCT: 6.8 % (ref 3.0–12.0)
NEUTROS ABS: 12.3 10*3/uL — AB (ref 1.4–7.7)
NEUTROS PCT: 78.3 % — AB (ref 43.0–77.0)
PLATELETS: 189 10*3/uL (ref 150.0–400.0)
RBC: 4.24 Mil/uL (ref 3.87–5.11)
RDW: 12.9 % (ref 11.5–15.5)
WBC: 15.7 10*3/uL — ABNORMAL HIGH (ref 4.0–10.5)

## 2017-04-29 LAB — COMPREHENSIVE METABOLIC PANEL
ALK PHOS: 102 U/L (ref 39–117)
ALT: 21 U/L (ref 0–35)
AST: 17 U/L (ref 0–37)
Albumin: 3.6 g/dL (ref 3.5–5.2)
BUN: 8 mg/dL (ref 6–23)
CHLORIDE: 100 meq/L (ref 96–112)
CO2: 32 mEq/L (ref 19–32)
Calcium: 9.2 mg/dL (ref 8.4–10.5)
Creatinine, Ser: 0.68 mg/dL (ref 0.40–1.20)
GFR: 92.68 mL/min (ref >60.00)
GLUCOSE: 187 mg/dL — AB (ref 70–99)
POTASSIUM: 3.7 meq/L (ref 3.5–5.1)
SODIUM: 139 meq/L (ref 135–145)
TOTAL PROTEIN: 7.1 g/dL (ref 6.0–8.3)
Total Bilirubin: 0.9 mg/dL (ref 0.2–1.2)

## 2017-04-29 LAB — LIPASE: Lipase: 118 U/L — ABNORMAL HIGH (ref 11.0–59.0)

## 2017-04-29 LAB — AMYLASE: Amylase: 43 U/L (ref 27–131)

## 2017-04-29 NOTE — Telephone Encounter (Signed)
See labs 

## 2017-04-30 ENCOUNTER — Other Ambulatory Visit: Payer: Self-pay | Admitting: *Deleted

## 2017-04-30 DIAGNOSIS — R1011 Right upper quadrant pain: Secondary | ICD-10-CM

## 2017-05-02 ENCOUNTER — Other Ambulatory Visit (INDEPENDENT_AMBULATORY_CARE_PROVIDER_SITE_OTHER): Payer: 59

## 2017-05-02 DIAGNOSIS — K853 Drug induced acute pancreatitis without necrosis or infection: Secondary | ICD-10-CM

## 2017-05-02 DIAGNOSIS — R1011 Right upper quadrant pain: Secondary | ICD-10-CM

## 2017-05-02 LAB — CBC WITH DIFFERENTIAL/PLATELET
BASOS ABS: 0.1 10*3/uL (ref 0.0–0.1)
Basophils Relative: 0.6 % (ref 0.0–3.0)
EOS ABS: 0.3 10*3/uL (ref 0.0–0.7)
Eosinophils Relative: 3.2 % (ref 0.0–5.0)
HCT: 41.2 % (ref 36.0–46.0)
Hemoglobin: 13.6 g/dL (ref 12.0–15.0)
LYMPHS ABS: 2 10*3/uL (ref 0.7–4.0)
LYMPHS PCT: 18 % (ref 12.0–46.0)
MCHC: 33 g/dL (ref 30.0–36.0)
MCV: 93.7 fl (ref 78.0–100.0)
MONOS PCT: 7.8 % (ref 3.0–12.0)
Monocytes Absolute: 0.9 10*3/uL (ref 0.1–1.0)
NEUTROS ABS: 7.7 10*3/uL (ref 1.4–7.7)
NEUTROS PCT: 70.4 % (ref 43.0–77.0)
PLATELETS: 176 10*3/uL (ref 150.0–400.0)
RBC: 4.4 Mil/uL (ref 3.87–5.11)
RDW: 12.8 % (ref 11.5–15.5)
WBC: 10.9 10*3/uL — ABNORMAL HIGH (ref 4.0–10.5)

## 2017-05-02 LAB — COMPREHENSIVE METABOLIC PANEL
ALT: 30 U/L (ref 0–35)
AST: 29 U/L (ref 0–37)
Albumin: 3.7 g/dL (ref 3.5–5.2)
Alkaline Phosphatase: 98 U/L (ref 39–117)
BILIRUBIN TOTAL: 0.6 mg/dL (ref 0.2–1.2)
BUN: 6 mg/dL (ref 6–23)
CALCIUM: 9.7 mg/dL (ref 8.4–10.5)
CHLORIDE: 101 meq/L (ref 96–112)
CO2: 31 meq/L (ref 19–32)
CREATININE: 0.77 mg/dL (ref 0.40–1.20)
GFR: 80.29 mL/min (ref >60.00)
GLUCOSE: 160 mg/dL — AB (ref 70–99)
Potassium: 3.9 mEq/L (ref 3.5–5.1)
SODIUM: 141 meq/L (ref 135–145)
Total Protein: 7.6 g/dL (ref 6.0–8.3)

## 2017-05-02 LAB — LIPASE: Lipase: 35 U/L (ref 11.0–59.0)

## 2017-05-06 ENCOUNTER — Other Ambulatory Visit: Payer: Self-pay | Admitting: Family Medicine

## 2017-05-06 DIAGNOSIS — E119 Type 2 diabetes mellitus without complications: Secondary | ICD-10-CM

## 2017-06-26 ENCOUNTER — Ambulatory Visit (INDEPENDENT_AMBULATORY_CARE_PROVIDER_SITE_OTHER): Payer: 59 | Admitting: Family Medicine

## 2017-06-26 ENCOUNTER — Encounter: Payer: Self-pay | Admitting: Family Medicine

## 2017-06-26 VITALS — BP 122/68 | HR 68 | Temp 98.2°F | Resp 18 | Ht 64.5 in | Wt 252.4 lb

## 2017-06-26 DIAGNOSIS — E1165 Type 2 diabetes mellitus with hyperglycemia: Secondary | ICD-10-CM

## 2017-06-26 DIAGNOSIS — E1151 Type 2 diabetes mellitus with diabetic peripheral angiopathy without gangrene: Secondary | ICD-10-CM

## 2017-06-26 DIAGNOSIS — E785 Hyperlipidemia, unspecified: Secondary | ICD-10-CM

## 2017-06-26 DIAGNOSIS — IMO0002 Reserved for concepts with insufficient information to code with codable children: Secondary | ICD-10-CM

## 2017-06-26 DIAGNOSIS — I10 Essential (primary) hypertension: Secondary | ICD-10-CM

## 2017-06-26 NOTE — Progress Notes (Signed)
Patient ID: Jacqueline Duran, female    DOB: April 25, 1951  Age: 66 y.o. MRN: [id number]    Subjective:  Subjective  HPI [name] presents for f/u from pancratitis----symptoms subsided and she has had no problems since.  She is taking the janumet xr 50/1000 2 po qd .--- she was on this prior to Victoza.    HYPERTENSION   Blood pressure range-not checking   Chest pain- no      Dyspnea- no Lightheadedness- no   Edema- no  Other side effects - no   Medication compliance: good Low salt diet- yes    DIABETES    Blood Sugar ranges-not checking   Polyuria- no New Visual problems- no  Hypoglycemic symptoms- no  Other side effects-no Medication compliance - good Last eye exam- 11/2016 Foot exam- today   HYPERLIPIDEMIA  Medication compliance- good RUQ pain- no  Muscle aches- no Other side effects-no   Review of Systems  Constitutional: Negative for chills and fever.  HENT: Negative for congestion and hearing loss.   Eyes: Negative for discharge.  Respiratory: Negative for cough and shortness of breath.   Cardiovascular: Negative for chest pain, palpitations and leg swelling.  Gastrointestinal: Negative for abdominal pain, blood in stool, constipation, diarrhea, nausea and vomiting.  Genitourinary: Negative for dysuria, frequency, hematuria and urgency.  Musculoskeletal: Negative for back pain and myalgias.  Skin: Negative for rash.  Allergic/Immunologic: Negative for environmental allergies.  Neurological: Negative for dizziness, weakness and headaches.  Hematological: Does not bruise/bleed easily.  Psychiatric/Behavioral: Negative for suicidal ideas. The patient is not nervous/anxious.     History Past Medical History:  Diagnosis Date  . Colon polyps   . Deafness    Right Ear  . Diabetes mellitus   . Hyperlipidemia   . Hypertension     She has a past surgical history that includes Abdominal hysterectomy; Tonsillectomy; Cervical fusion (07/2004); and Colonoscopy  (8/11).   Her family history includes Alcohol abuse in her other and unknown relative; Cancer in her father; Clotting disorder in her sister; Coronary artery disease in her mother; Factor V Leiden deficiency in her other; Heart attack in her unknown relative; Heart disease in her other; Leukemia in her maternal uncle; Lung cancer in her father; Stroke in her unknown relative.She reports that she has never smoked. She has never used smokeless tobacco. She reports that she does not drink alcohol or use drugs.  Current Outpatient Prescriptions on File Prior to Visit  Medication Sig Dispense Refill  . albuterol (PROVENTIL HFA;VENTOLIN HFA) 108 (90 Base) MCG/ACT inhaler Inhale 2 puffs into the lungs every 6 (six) hours as needed for wheezing or shortness of breath. 1 Inhaler 2  . ascorbic acid (VITAMIN C) 500 MG tablet Take 1,000 mg by mouth daily.     Marland Kitchen aspirin 81 MG tablet Take 81 mg by mouth daily.      Marland Kitchen atorvastatin (LIPITOR) 10 MG tablet Take 1 tablet (10 mg total) by mouth daily. 90 tablet 1  . dapagliflozin propanediol (FARXIGA) 10 MG TABS tablet Take 10 mg by mouth daily. 90 tablet 1  . escitalopram (LEXAPRO) 20 MG tablet Take 1 tablet (20 mg total) by mouth daily. 90 tablet 1  . fluticasone (FLOVENT HFA) 110 MCG/ACT inhaler Inhale 2 puffs into the lungs 2 (two) times daily. 1 Inhaler 0  . folic acid (FOLVITE) 1 MG tablet TAKE 1 TABLET(1 MG) BY MOUTH DAILY 90 tablet 1  . glucose blood (ONE TOUCH ULTRA TEST) test strip Check  blood sugar once daily. Dx:E11.9 100 each 5  . Insulin Pen Needle (NOVOFINE) 32G X 6 MM MISC To use with victoza 50 each 4  . Lactobacillus (PROBIOTIC ACIDOPHILUS PO) Take by mouth.    . Magnesium 500 MG TABS Take 1 tablet by mouth daily.    Marland Kitchen MEGARED OMEGA-3 KRILL OIL 500 MG CAPS Take 1 capsule by mouth daily.    . Multiple Vitamin (MULTIVITAMIN) tablet Take 1 tablet by mouth daily.      [name] DELICA LANCETS FINE MISC Check blood sugar once daily. Dx:E11.9 100 each 5   . Turmeric 500 MG CAPS Take 1 capsule by mouth daily.    . valsartan-hydrochlorothiazide (DIOVAN-HCT) 160-25 MG tablet Take 1 tablet by mouth daily. 90 tablet 1   No current facility-administered medications on file prior to visit.      Objective:  Objective  Physical Exam  Constitutional: She is oriented to person, place, and time. She appears well-developed and well-nourished.  HENT:  Head: Normocephalic and atraumatic.  Eyes: Conjunctivae and EOM are normal.  Neck: Normal range of motion. Neck supple. No JVD present. Carotid bruit is not present. No thyromegaly present.  Cardiovascular: Normal rate, regular rhythm and normal heart sounds.   No murmur heard. Pulmonary/Chest: Effort normal and breath sounds normal. No respiratory distress. She has no wheezes. She has no rales. She exhibits no tenderness.  Musculoskeletal: She exhibits no edema.  Neurological: She is alert and oriented to person, place, and time.  Psychiatric: She has a normal mood and affect.  Nursing note and vitals reviewed. feet-- unable to feel the monofilament BP 122/68 (BP Location: Left Arm, Patient Position: Sitting, Cuff Size: Large)   Pulse 68   Temp 98.2 F (36.8 C) (Oral)   Resp 18   Ht 5' 4.5" (1.638 m)   Wt 252 lb 6.4 oz (114.5 kg)   SpO2 96%   BMI 42.66 kg/m  Wt Readings from Last 3 Encounters:  06/26/17 252 lb 6.4 oz (114.5 kg)  04/28/17 251 lb 6.4 oz (114 kg)  12/13/16 255 lb 3.2 oz (115.8 kg)     Lab Results  Component Value Date   WBC 10.9 (H) 05/02/2017   HGB 13.6 05/02/2017   HCT 41.2 05/02/2017   PLT 176.0 05/02/2017   GLUCOSE 160 (H) 05/02/2017   CHOL 115 04/08/2017   TRIG 161.0 (H) 04/08/2017   HDL 41.70 04/08/2017   LDLCALC 41 04/08/2017   ALT 30 05/02/2017   AST 29 05/02/2017   NA 141 05/02/2017   K 3.9 05/02/2017   CL 101 05/02/2017   CREATININE 0.77 05/02/2017   BUN 6 05/02/2017   CO2 31 05/02/2017   TSH 1.95 12/13/2016   HGBA1C 6.7 (H) 04/08/2017   MICROALBUR  <0.7 03/15/2016    US Abdomen Complete  Result Date: 04/28/2017 CLINICAL DATA:  A week of right upper quadrant abdominal pain. Elevated white blood cell count today. History of diabetes and hyper tension. EXAM: ABDOMEN ULTRASOUND COMPLETE COMPARISON:  Abdominal ultrasound of September 05, 2010 FINDINGS: The study is limited due to excessive bowel gas. Gallbladder: No gallstones or wall thickening visualized. No sonographic Murphy sign noted by sonographer. Common bile duct: Diameter: 4.5 mm Liver: The hepatic echotexture is increased. Decreased density adjacent to the gallbladder likely reflects focal fatty sparing. No discrete hepatic mass is observed. Portal venous flow was normal in direction toward the liver. There is no intrahepatic ductal dilation. IVC: Visualization is limited due to bowel gas. Pancreas: Visualization  of the pancreas is limited by bowel gas. Spleen: Size and appearance within normal limits. Right Kidney: Length: 11.2 cm. Echogenicity within normal limits. No mass or hydronephrosis visualized. Left Kidney: Length: 9.9 cm. Echogenicity within normal limits. No mass or hydronephrosis visualized. Abdominal aorta: Bowel gas limits evaluation of the abdominal aorta. Other findings: There is no ascites. IMPRESSION: No gallstones or sonographic evidence of acute cholecystitis. If there are clinical concerns of gallbladder dysfunction, a nuclear medicine hepatobiliary scan with gallbladder ejection fraction may be useful. Hepatic steatosis. No acute intra-abdominal abnormality is observed. Electronically Signed   By: David  Martinique M.D.   On: 04/28/2017 15:05   Ct Abdomen Pelvis W Contrast  Addendum Date: 04/28/2017   ADDENDUM REPORT: 04/28/2017 18:38 ADDENDUM: These results will be called to the ordering clinician or representative by the Radiologist Assistant, and communication documented in the PACS or zVision Dashboard. Electronically Signed   By: Donavan Foil M.D.   On: 04/28/2017 18:38    Result Date: 04/28/2017 CLINICAL DATA:  Right-sided rib pain for 1 week history of gallbladder problems EXAM: CT ABDOMEN AND PELVIS WITH CONTRAST TECHNIQUE: Multidetector CT imaging of the abdomen and pelvis was performed using the standard protocol following bolus administration of intravenous contrast. CONTRAST:  177mL ISOVUE-300 IOPAMIDOL (ISOVUE-300) INJECTION 61% COMPARISON:  04/28/2017, 09/11/2010 FINDINGS: Lower chest: Lung bases demonstrate no acute consolidation or pleural effusion. 4 mm right middle lobe pulmonary nodule, series 4, image number 1. Normal heart size. Hepatobiliary: Hepatic steatosis. No biliary dilatation or calcified gallstones. Pancreas: Head and uncinate process appear slightly enlarged and indistinct. Mild hazy edema surrounding the head and uncinate process. No focal fluid collections. Spleen: Accessory splenule.  Spleen otherwise normal. Adrenals/Urinary Tract: Adrenal glands are unremarkable. Kidneys are normal, without renal calculi, focal lesion, or hydronephrosis. Bladder is unremarkable. Stomach/Bowel: Stomach is within normal limits. Appendix appears normal. No significant wall thickening. Scattered sigmoid colon diverticula without acute inflammation. Vascular/Lymphatic: Aortic atherosclerosis. No enlarged pelvic lymph nodes. Subcentimeter peripancreatic lymph nodes. Normal portal vein and splenic vein enhancement. Reproductive: Status post hysterectomy. No adnexal masses. Other: No free air. No significant free fluid. Small fat in the umbilicus. Musculoskeletal: No acute or suspicious bone lesion. Degenerative changes of the upper lumbar spine. Grade 1 anterolisthesis of L4 on L5. IMPRESSION: 1. Slightly enlarged and indistinct appearing pancreatic head and uncinate process with mild surrounding edema, the findings are suspicious for a mild pancreatitis. No focal fluid collections are seen. Suggest correlation with laboratory values. 2. Hepatic steatosis 3. Sigmoid colon  diverticular disease without acute inflammation. 4. 4 mm right middle lobe pulmonary nodule. No follow-up needed if patient is low-risk. Non-contrast chest CT can be considered in 12 months if patient is high-risk. This recommendation follows the consensus statement: Guidelines for Management of Incidental Pulmonary Nodules Detected on CT Images: From the Fleischner Society 2017; Radiology 2017; 284:228-243. Electronically Signed: By: Donavan Foil M.D. On: 04/28/2017 18:12     Assessment & Plan:  Plan  I have discontinued Ms. Mellette's metFORMIN, liraglutide, and ciprofloxacin. I am also having her maintain her ascorbic acid, aspirin, multivitamin, Magnesium, MEGARED OMEGA-3 KRILL OIL, Lactobacillus (PROBIOTIC ACIDOPHILUS PO), Turmeric, fluticasone, albuterol, ONETOUCH DELICA LANCETS FINE, glucose blood, valsartan-hydrochlorothiazide, dapagliflozin propanediol, atorvastatin, escitalopram, folic acid, and Insulin Pen Needle.  No orders of the defined types were placed in this encounter.   Problem List Items Addressed This Visit      Unprioritized   DM (diabetes mellitus) type II uncontrolled, periph vascular disorder ([facility]) - Primary  Relevant Orders   Lipid panel   Hemoglobin A1c   Comprehensive metabolic panel   Microalbumin / creatinine urine ratio   Essential hypertension   Relevant Orders   Lipid panel   Hemoglobin A1c   Comprehensive metabolic panel   Microalbumin / creatinine urine ratio    Other Visit Diagnoses    Hyperlipidemia LDL goal <70       Relevant Orders   Lipid panel   Hemoglobin A1c   Comprehensive metabolic panel   Microalbumin / creatinine urine ratio      Follow-up: Return in about 6 months (around 12/27/2017) for hypertension, hyperlipidemia, diabetes II.  [name], DO

## 2017-06-26 NOTE — Assessment & Plan Note (Signed)
Tolerating statin, encouraged heart healthy diet, avoid trans fats, minimize simple carbs and saturated fats. Increase exercise as tolerated 

## 2017-06-26 NOTE — Assessment & Plan Note (Signed)
Well controlled, no changes to meds. Encouraged heart healthy diet such as the DASH diet and exercise as tolerated.  °

## 2017-06-26 NOTE — Assessment & Plan Note (Signed)
hgba1c to be checked, minimize simple carbs. Increase exercise as tolerated. Continue current meds  

## 2017-06-26 NOTE — Patient Instructions (Signed)

## 2017-07-24 LAB — HM MAMMOGRAPHY

## 2017-08-11 ENCOUNTER — Encounter: Payer: Self-pay | Admitting: Family Medicine

## 2017-08-25 ENCOUNTER — Encounter: Payer: Self-pay | Admitting: Family Medicine

## 2017-08-25 DIAGNOSIS — IMO0001 Reserved for inherently not codable concepts without codable children: Secondary | ICD-10-CM

## 2017-08-25 DIAGNOSIS — E1151 Type 2 diabetes mellitus with diabetic peripheral angiopathy without gangrene: Secondary | ICD-10-CM

## 2017-08-25 DIAGNOSIS — E785 Hyperlipidemia, unspecified: Secondary | ICD-10-CM

## 2017-08-25 DIAGNOSIS — F411 Generalized anxiety disorder: Secondary | ICD-10-CM

## 2017-08-25 DIAGNOSIS — IMO0002 Reserved for concepts with insufficient information to code with codable children: Secondary | ICD-10-CM

## 2017-08-25 DIAGNOSIS — E1165 Type 2 diabetes mellitus with hyperglycemia: Secondary | ICD-10-CM

## 2017-08-25 DIAGNOSIS — I10 Essential (primary) hypertension: Secondary | ICD-10-CM

## 2017-08-25 NOTE — Telephone Encounter (Signed)
Yes janumet  50/1000 xr  2 po qd

## 2017-08-26 ENCOUNTER — Encounter: Payer: Self-pay | Admitting: Family Medicine

## 2017-08-26 MED ORDER — SITAGLIP PHOS-METFORMIN HCL ER 50-1000 MG PO TB24: 2.0000 | ORAL_TABLET | Freq: Every day | ORAL | 1 refills | Status: DC

## 2017-08-26 MED ORDER — VALSARTAN-HYDROCHLOROTHIAZIDE 160-25 MG PO TABS: 1.0000 | ORAL_TABLET | Freq: Every day | ORAL | 1 refills | Status: DC

## 2017-08-26 MED ORDER — ESCITALOPRAM OXALATE 20 MG PO TABS: 20.0000 mg | ORAL_TABLET | Freq: Every day | ORAL | 1 refills | Status: DC

## 2017-08-26 MED ORDER — DAPAGLIFLOZIN PROPANEDIOL 10 MG PO TABS: 10.0000 mg | ORAL_TABLET | Freq: Every day | ORAL | 1 refills | Status: DC

## 2017-08-26 MED ORDER — SITAGLIPTIN PHOS-METFORMIN HCL 50-1000 MG PO TABS: 1.0000 | ORAL_TABLET | Freq: Two times a day (BID) | ORAL | 1 refills | Status: DC

## 2017-08-26 MED ORDER — ATORVASTATIN CALCIUM 10 MG PO TABS: 10.0000 mg | ORAL_TABLET | Freq: Every day | ORAL | 1 refills | Status: DC

## 2017-09-03 NOTE — Telephone Encounter (Signed)
error:[id number] ° °

## 2017-11-06 ENCOUNTER — Telehealth: Payer: Self-pay | Admitting: Family Medicine

## 2017-11-06 NOTE — Telephone Encounter (Signed)
D/c valsartan / hct and change to benicar 20/ 12.5 1 po qd #30  2 refills  bp check 2-3 weeks

## 2017-11-06 NOTE — Telephone Encounter (Signed)
Copied from [place] [id number]. Topic: Quick Communication - See Telephone Encounter >> Nov 06, 2017  1:59 PM Cleaster Corin, NT wrote: CRM for notification. See Telephone encounter for:   11/06/17. [facility] calling to let Dr. Cheri Rous know that pt. Would like to change med. Valsartan-hydrochlorothiazide to another med. Due to recall.    [facility] [place] [place] - [address] [place] [address] Phone: [phone] Fax: [phone]

## 2017-11-07 MED ORDER — OLMESARTAN MEDOXOMIL-HCTZ 20-12.5 MG PO TABS: 1.0000 | ORAL_TABLET | Freq: Every day | ORAL | 2 refills | Status: DC

## 2017-11-07 NOTE — Telephone Encounter (Signed)
Left detailed message on machine.

## 2017-11-10 IMAGING — CT CT ABD-PELV W/ CM
2 of 5 series · 16 of 46 positions shown, 18 images · IV contrast (APPLIED)
Comparison: 04/28/2017, 09/11/2010

ADDENDUM:
These results will be called to the ordering clinician or
representative by the Radiologist Assistant, and communication
documented in the PACS or zVision Dashboard.
CLINICAL DATA: Right-sided rib pain for 1 week history of
gallbladder problems

EXAM:
CT ABDOMEN AND PELVIS WITH CONTRAST
TECHNIQUE: Multidetector CT imaging of the abdomen and pelvis was performed
using the standard protocol following bolus administration of
intravenous contrast.
CONTRAST:  100mL FMD5UF-BSS IOPAMIDOL (FMD5UF-BSS) INJECTION 61%

[Series 3: axial st · axial · 0.90mm/px · z∈[-783,-363]mm · 13 of 96 slices shown, 15 images]
[im 6/96  soft-tissue]
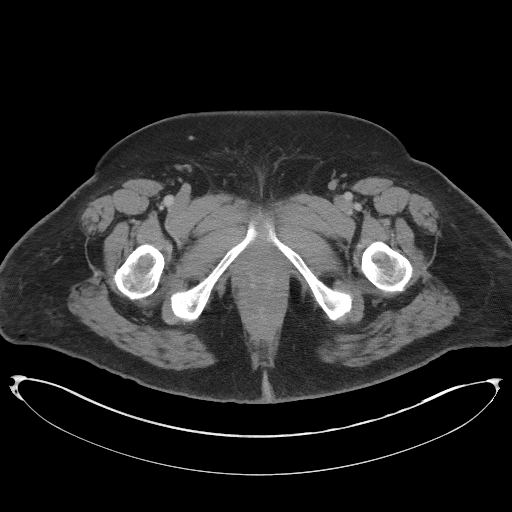
[im 6/96  bone]
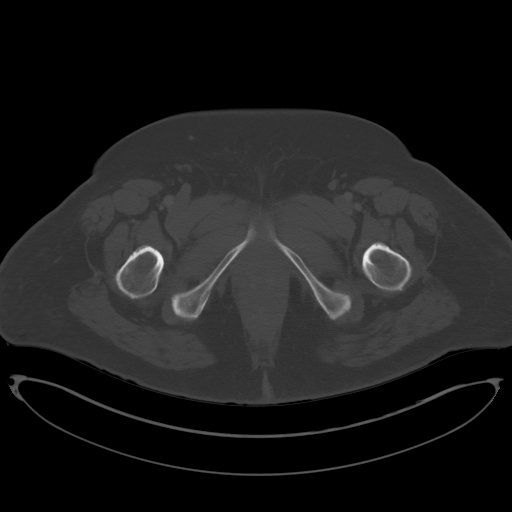
[im 12/96  soft-tissue]
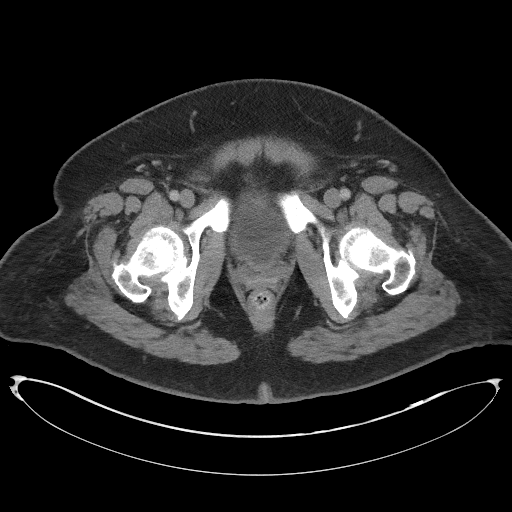
[im 23/96  soft-tissue]
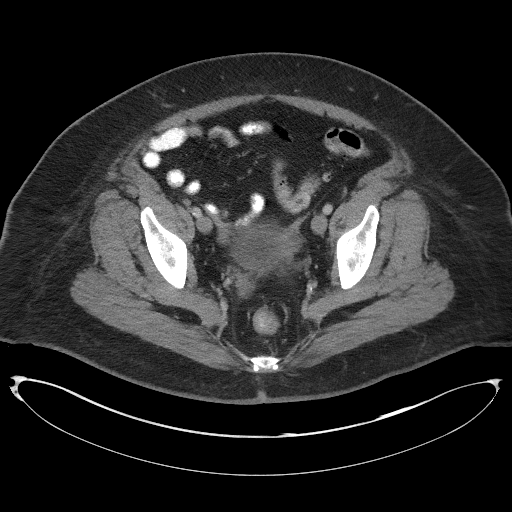
[im 28/96  soft-tissue]
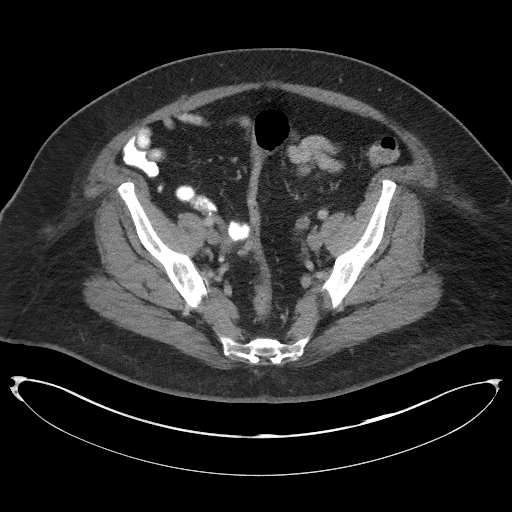
[im 34/96  soft-tissue]
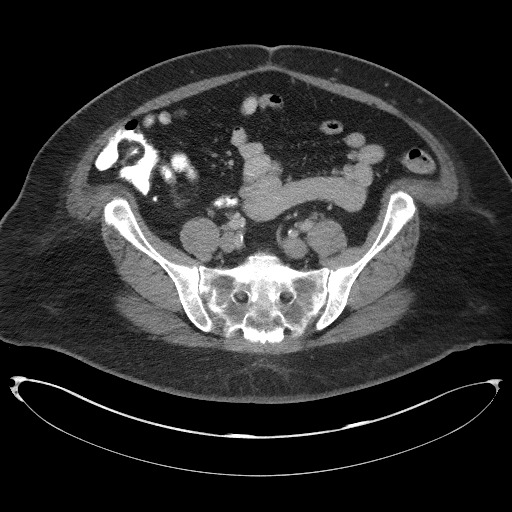
[im 40/96  soft-tissue]
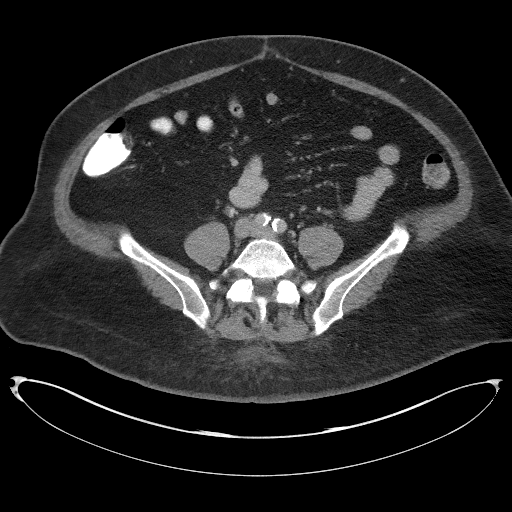
[im 51/96  soft-tissue]
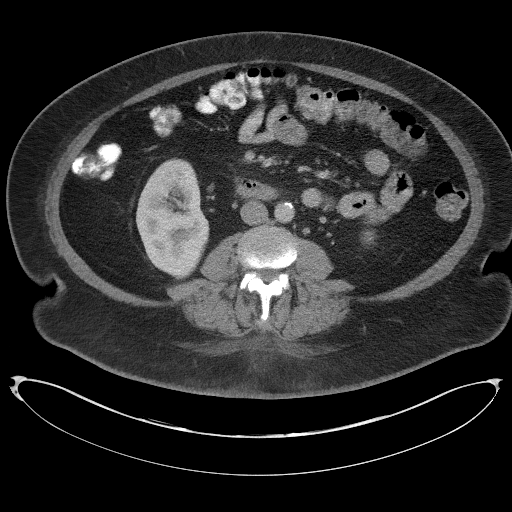
[im 56/96  soft-tissue]
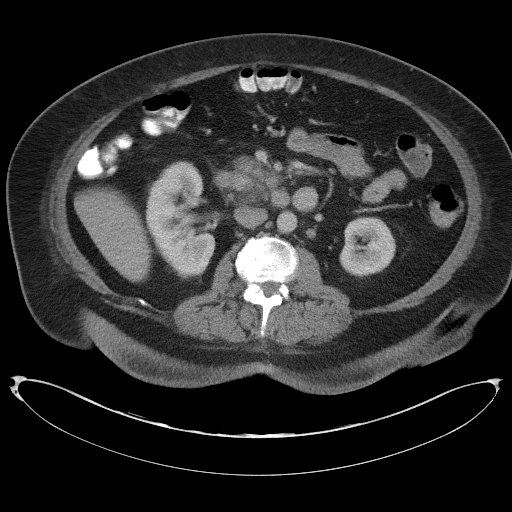
[im 62/96  soft-tissue]
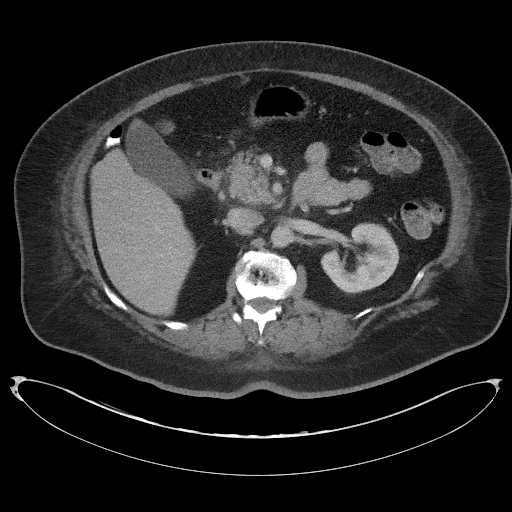
[im 62/96  bone]
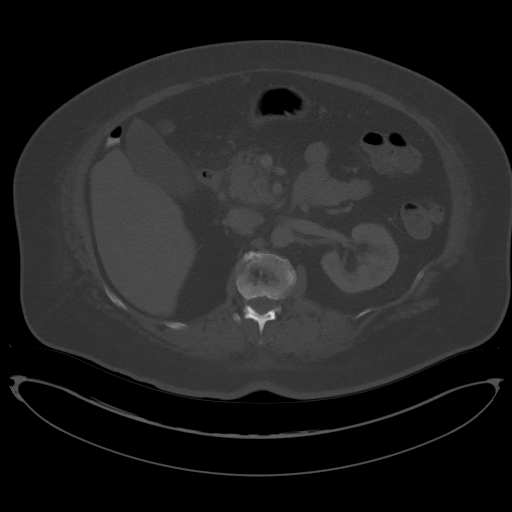
[im 68/96  soft-tissue]
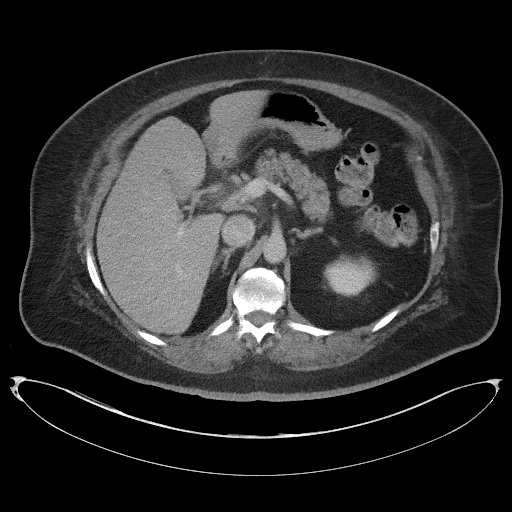
[im 73/96  soft-tissue]
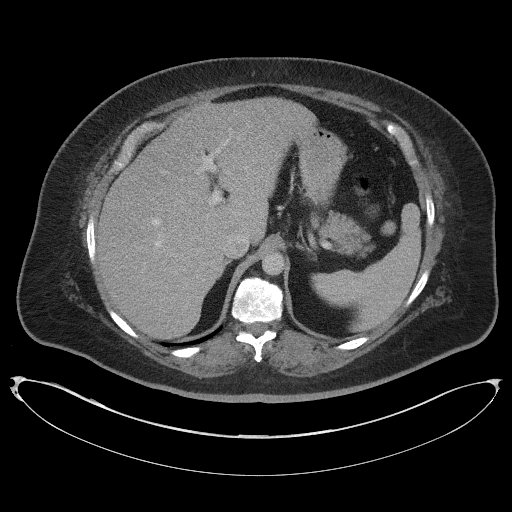
[im 84/96  soft-tissue]
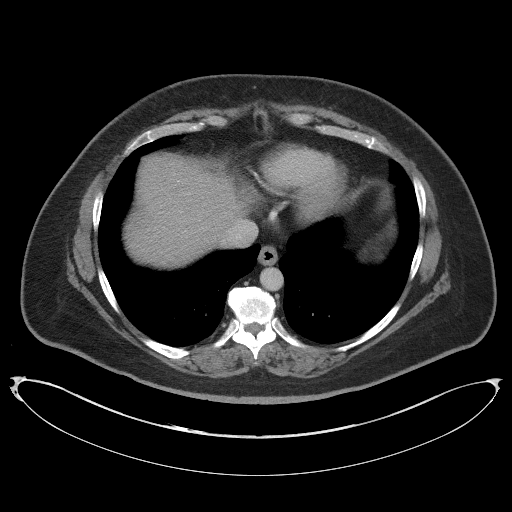
[im 90/96  soft-tissue]
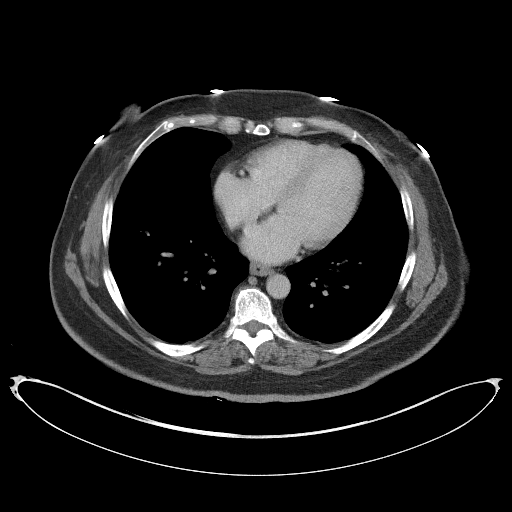

[Series 6: coronal st · coronal · 0.86mm/px · 3 of 107 slices shown]
[im 36/107  soft-tissue]
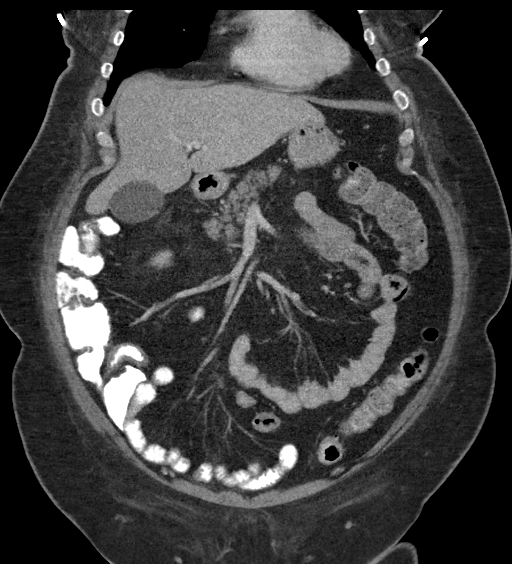
[im 48/107  soft-tissue]
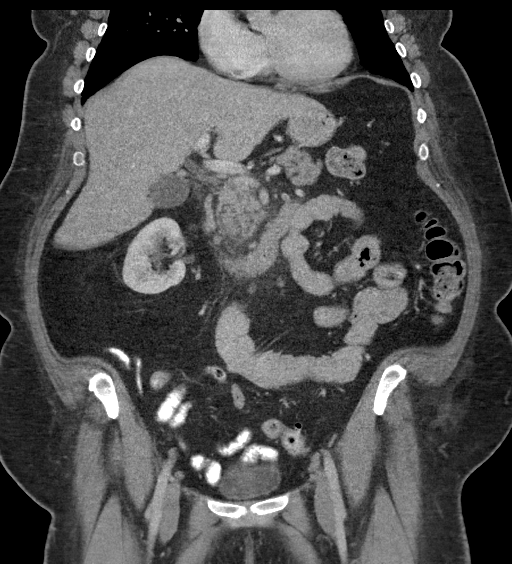
[im 59/107  soft-tissue]
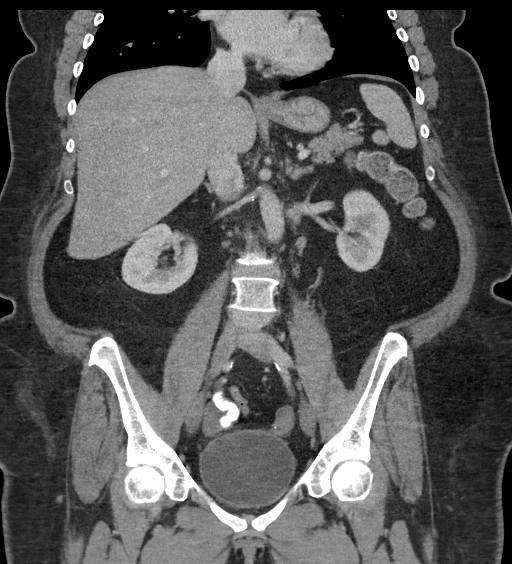

[16 of 46 positions shown; findings below may reference images not displayed]

FINDINGS: Lower chest: Lung bases demonstrate no acute consolidation or
pleural effusion. 4 mm right middle lobe pulmonary nodule, series 4,
image number 1. Normal heart size.

Hepatobiliary: Hepatic steatosis. No biliary dilatation or calcified
gallstones.

Pancreas: Head and uncinate process appear slightly enlarged and
indistinct. Mild hazy edema surrounding the head and uncinate
process. No focal fluid collections.

Spleen: Accessory splenule.  Spleen otherwise normal.

Adrenals/Urinary Tract: Adrenal glands are unremarkable. Kidneys are
normal, without renal calculi, focal lesion, or hydronephrosis.
Bladder is unremarkable.

Stomach/Bowel: Stomach is within normal limits. Appendix appears
normal. No significant wall thickening. Scattered sigmoid colon
diverticula without acute inflammation.

Vascular/Lymphatic: Aortic atherosclerosis. No enlarged pelvic lymph
nodes. Subcentimeter peripancreatic lymph nodes. Normal portal vein
and splenic vein enhancement.

Reproductive: Status post hysterectomy. No adnexal masses.

Other: No free air. No significant free fluid. Small fat in the
umbilicus.

Musculoskeletal: No acute or suspicious bone lesion. Degenerative
changes of the upper lumbar spine. Grade 1 anterolisthesis of L4 on
L5.
IMPRESSION: 1. Slightly enlarged and indistinct appearing pancreatic head and
uncinate process with mild surrounding edema, the findings are
suspicious for a mild pancreatitis. No focal fluid collections are
seen. Suggest correlation with laboratory values.
2. Hepatic steatosis
3. Sigmoid colon diverticular disease without acute inflammation.
4. 4 mm right middle lobe pulmonary nodule. No follow-up needed if
patient is low-risk. Non-contrast chest CT can be considered in 12
months if patient is high-risk. This recommendation follows the
consensus statement: Guidelines for Management of Incidental
Pulmonary Nodules Detected on CT Images: From the [HOSPITAL]

## 2017-11-13 LAB — HM DIABETES EYE EXAM

## 2017-11-25 ENCOUNTER — Other Ambulatory Visit: Payer: Self-pay | Admitting: Family Medicine

## 2017-11-25 DIAGNOSIS — E1165 Type 2 diabetes mellitus with hyperglycemia: Principal | ICD-10-CM

## 2017-11-25 DIAGNOSIS — IMO0001 Reserved for inherently not codable concepts without codable children: Secondary | ICD-10-CM

## 2017-12-10 ENCOUNTER — Other Ambulatory Visit: Payer: Self-pay | Admitting: *Deleted

## 2017-12-10 MED ORDER — OLMESARTAN MEDOXOMIL-HCTZ 20-12.5 MG PO TABS: 1.0000 | ORAL_TABLET | Freq: Every day | ORAL | 0 refills | Status: DC

## 2017-12-18 ENCOUNTER — Other Ambulatory Visit: Payer: Self-pay | Admitting: Family Medicine

## 2017-12-18 DIAGNOSIS — E1165 Type 2 diabetes mellitus with hyperglycemia: Principal | ICD-10-CM

## 2017-12-18 DIAGNOSIS — IMO0001 Reserved for inherently not codable concepts without codable children: Secondary | ICD-10-CM

## 2017-12-26 ENCOUNTER — Other Ambulatory Visit: Payer: Self-pay | Admitting: Family Medicine

## 2017-12-26 ENCOUNTER — Ambulatory Visit: Payer: 59 | Admitting: Family Medicine

## 2017-12-26 ENCOUNTER — Encounter: Payer: Self-pay | Admitting: Family Medicine

## 2017-12-26 ENCOUNTER — Telehealth: Payer: Self-pay | Admitting: Family Medicine

## 2017-12-26 VITALS — BP 116/70 | HR 83 | Temp 98.4°F | Resp 16 | Ht 65.0 in | Wt 253.2 lb

## 2017-12-26 DIAGNOSIS — J111 Influenza due to unidentified influenza virus with other respiratory manifestations: Secondary | ICD-10-CM | POA: Diagnosis not present

## 2017-12-26 DIAGNOSIS — E1151 Type 2 diabetes mellitus with diabetic peripheral angiopathy without gangrene: Secondary | ICD-10-CM | POA: Diagnosis not present

## 2017-12-26 DIAGNOSIS — E1169 Type 2 diabetes mellitus with other specified complication: Secondary | ICD-10-CM

## 2017-12-26 DIAGNOSIS — E785 Hyperlipidemia, unspecified: Secondary | ICD-10-CM | POA: Diagnosis not present

## 2017-12-26 DIAGNOSIS — IMO0001 Reserved for inherently not codable concepts without codable children: Secondary | ICD-10-CM

## 2017-12-26 DIAGNOSIS — I10 Essential (primary) hypertension: Secondary | ICD-10-CM

## 2017-12-26 DIAGNOSIS — IMO0002 Reserved for concepts with insufficient information to code with codable children: Secondary | ICD-10-CM

## 2017-12-26 DIAGNOSIS — E1159 Type 2 diabetes mellitus with other circulatory complications: Secondary | ICD-10-CM

## 2017-12-26 DIAGNOSIS — E1165 Type 2 diabetes mellitus with hyperglycemia: Secondary | ICD-10-CM | POA: Diagnosis not present

## 2017-12-26 LAB — LIPID PANEL
CHOL/HDL RATIO: 3
Cholesterol: 123 mg/dL (ref 0–200)
HDL: 44.5 mg/dL (ref >39.00)
LDL Cholesterol: 41 mg/dL (ref 0–99)
NONHDL: 78.16
Triglycerides: 185 mg/dL — ABNORMAL HIGH (ref 0.0–149.0)
VLDL: 37 mg/dL (ref 0.0–40.0)

## 2017-12-26 LAB — COMPREHENSIVE METABOLIC PANEL
ALBUMIN: 3.8 g/dL (ref 3.5–5.2)
ALK PHOS: 94 U/L (ref 39–117)
ALT: 20 U/L (ref 0–35)
AST: 20 U/L (ref 0–37)
BUN: 16 mg/dL (ref 6–23)
CO2: 31 mEq/L (ref 19–32)
CREATININE: 0.81 mg/dL (ref 0.40–1.20)
Calcium: 9.6 mg/dL (ref 8.4–10.5)
Chloride: 102 mEq/L (ref 96–112)
GFR: 75.58 mL/min (ref >60.00)
GLUCOSE: 128 mg/dL — AB (ref 70–99)
POTASSIUM: 4.5 meq/L (ref 3.5–5.1)
SODIUM: 140 meq/L (ref 135–145)
TOTAL PROTEIN: 7.1 g/dL (ref 6.0–8.3)
Total Bilirubin: 0.5 mg/dL (ref 0.2–1.2)

## 2017-12-26 LAB — MICROALBUMIN / CREATININE URINE RATIO
Creatinine,U: 64.8 mg/dL
MICROALB/CREAT RATIO: 1.1 mg/g (ref 0.0–30.0)
Microalb, Ur: <0.7 mg/dL (ref 0.0–1.9)

## 2017-12-26 LAB — HEMOGLOBIN A1C: HEMOGLOBIN A1C: 7.2 % — AB (ref 4.6–6.5)

## 2017-12-26 MED ORDER — OLMESARTAN MEDOXOMIL-HCTZ 20-12.5 MG PO TABS: 1.0000 | ORAL_TABLET | Freq: Every day | ORAL | 1 refills | Status: DC

## 2017-12-26 MED ORDER — OSELTAMIVIR PHOSPHATE 75 MG PO CAPS: 75.0000 mg | ORAL_CAPSULE | Freq: Every day | ORAL | 0 refills | Status: DC

## 2017-12-26 NOTE — Patient Instructions (Signed)

## 2017-12-26 NOTE — Telephone Encounter (Signed)
Copied from [place] [id number]. Topic: Quick Communication - Rx Refill/Question >> Dec 26, 2017  9:11 AM Scherrie Gerlach wrote: Medication: oseltamivir (TAMIFLU) 75 MG capsule  Pharmacy would like to know why pt needs 30 capsules?  Rx usually 5 - 10 days. Would like a call back in order to refill to clarify [facility],  - [address] [phone] (Phone) [phone] (Fax)

## 2017-12-26 NOTE — Telephone Encounter (Signed)
Pharmacy notified. Per Dr. Etter Sjogren 30 caps is for preventative because of her job.

## 2017-12-26 NOTE — Progress Notes (Signed)
Patient ID: Jacqueline Duran, female   DOB: 10/22/1951, 67 y.o.   MRN: [id number]    Subjective:  I acted as a Education administrator for Dr. Carollee Herter.  [name], [facility]   Patient ID: Jacqueline Duran, female    DOB: [birth year], 67 y.o.   MRN: [id number]  Chief Complaint  Patient presents with  . Diabetes  . Hypertension  . Hyperlipidemia    HPI  Patient is in today for follow up blood pressure, cholesterol, and diabetes.  She is doing well on current treatment. Pt has been exposed to a lot of flu and is concerned she may get it.    HYPERTENSION   Blood pressure range-not checking   Chest pain- no      Dyspnea- no Lightheadedness- no   Edema- no  Other side effects - no   Medication compliance: good Low salt diet- yes    DIABETES    Blood Sugar ranges-not checking   Polyuria- no New Visual problems- no  Hypoglycemic symptoms- no  Other side effects-no Medication compliance - no Last eye exam- Jan 2019 Foot exam- today   HYPERLIPIDEMIA  Medication compliance- good RUQ pain- no  Muscle aches- no Other side effects-no   Patient Care Team: [name], DO as PCP - General   Past Medical History:  Diagnosis Date  . Colon polyps   . Deafness    Right Ear  . Diabetes mellitus   . Hyperlipidemia   . Hypertension     Past Surgical History:  Procedure Laterality Date  . ABDOMINAL HYSTERECTOMY    . CERVICAL FUSION  07/2004  . COLONOSCOPY  8/11   Hyperplastic polyps  . TONSILLECTOMY      Family History  Problem Relation Age of Onset  . Lung cancer Father   . Cancer Father        LUNG  . Heart attack Unknown   . Alcohol abuse Unknown   . Stroke Unknown        Female <50/Female <60  . Clotting disorder Sister        Factor V  . Coronary artery disease Mother   . Leukemia Maternal Uncle   . Heart disease Other   . Factor V Leiden deficiency Other        THROMBOEBOLISM CLOTTING DISORDER  . Alcohol abuse Other     Social History   Socioeconomic History  .  Marital status: Married    Spouse name: Not on file  . Number of children: Not on file  . Years of education: Not on file  . Highest education level: Not on file  Social Needs  . Financial resource strain: Not on file  . Food insecurity - worry: Not on file  . Food insecurity - inability: Not on file  . Transportation needs - medical: Not on file  . Transportation needs - non-medical: Not on file  Occupational History  . Occupation: unemployed  Tobacco Use  . Smoking status: Never Smoker  . Smokeless tobacco: Never Used  Substance and Sexual Activity  . Alcohol use: No  . Drug use: No  . Sexual activity: Not Currently    Partners: Male  Other Topics Concern  . Not on file  Social History Narrative   Live at home alone    Outpatient Medications Prior to Visit  Medication Sig Dispense Refill  . albuterol (PROVENTIL HFA;VENTOLIN HFA) 108 (90 Base) MCG/ACT inhaler Inhale 2 puffs into the lungs every 6 (six) hours as needed for  wheezing or shortness of breath. 1 Inhaler 2  . ascorbic acid (VITAMIN C) 500 MG tablet Take 1,000 mg by mouth daily.     Marland Kitchen aspirin 81 MG tablet Take 81 mg by mouth daily.      Marland Kitchen atorvastatin (LIPITOR) 10 MG tablet Take 1 tablet (10 mg total) by mouth daily. 90 tablet 1  . dapagliflozin propanediol (FARXIGA) 10 MG TABS tablet Take 10 mg by mouth daily. 90 tablet 1  . escitalopram (LEXAPRO) 20 MG tablet Take 1 tablet (20 mg total) by mouth daily. 90 tablet 1  . fluticasone (FLOVENT HFA) 110 MCG/ACT inhaler Inhale 2 puffs into the lungs 2 (two) times daily. 1 Inhaler 0  . folic acid (FOLVITE) 1 MG tablet TAKE 1 TABLET BY MOUTH ONCE DAILY 90 tablet 0  . glucose blood (ONE TOUCH ULTRA TEST) test strip Check blood sugar once daily. Dx:E11.9 100 each 5  . Lactobacillus (PROBIOTIC ACIDOPHILUS PO) Take by mouth.    . Magnesium 500 MG TABS Take 1 tablet by mouth daily.    Marland Kitchen MEGARED OMEGA-3 KRILL OIL 500 MG CAPS Take 1 capsule by mouth daily.    . Multiple Vitamin  (MULTIVITAMIN) tablet Take 1 tablet by mouth daily.      Marland Kitchen olmesartan-hydrochlorothiazide (BENICAR HCT) 20-12.5 MG tablet Take 1 tablet by mouth daily. 90 tablet 0  . ONETOUCH DELICA LANCETS FINE MISC Check blood sugar once daily. Dx:E11.9 100 each 5  . SitaGLIPtin-MetFORMIN HCl 50-1000 MG TB24 Take 2 tablets by mouth daily. 180 tablet 1  . Turmeric 500 MG CAPS Take 1 capsule by mouth daily.     No facility-administered medications prior to visit.     Allergies  Allergen Reactions  . Amlodipine Besylate     REACTION: swelling in feet and ankles/rash/pain  . Other Itching    INVOKANA    Review of Systems  Constitutional: Negative for fever and malaise/fatigue.  HENT: Negative for congestion.   Eyes: Negative for blurred vision.  Respiratory: Negative for cough and shortness of breath.   Cardiovascular: Negative for chest pain, palpitations and leg swelling.  Gastrointestinal: Negative for vomiting.  Musculoskeletal: Negative for back pain.  Skin: Negative for rash.  Neurological: Negative for loss of consciousness and headaches.       Objective:    Physical Exam  Constitutional: She is oriented to person, place, and time. She appears well-developed and well-nourished.  HENT:  Head: Normocephalic and atraumatic.  Eyes: Conjunctivae and EOM are normal.  Neck: Normal range of motion. Neck supple. No JVD present. Carotid bruit is not present. No thyromegaly present.  Cardiovascular: Normal rate, regular rhythm and normal heart sounds.  No murmur heard. Pulmonary/Chest: Effort normal and breath sounds normal. No respiratory distress. She has no wheezes. She has no rales. She exhibits no tenderness.  Musculoskeletal: She exhibits no edema.  Neurological: She is alert and oriented to person, place, and time.  Psychiatric: She has a normal mood and affect.  Nursing note and vitals reviewed. monofilament---pt unable to feel monofilament--- no change  BP 116/70 (BP Location: Right  Arm, Cuff Size: Large)   Pulse 83   Temp 98.4 F (36.9 C) (Oral)   Resp 16   Ht 5\' 5"  (1.651 m)   Wt 253 lb 3.2 oz (114.9 kg)   SpO2 94%   BMI 42.13 kg/m  Wt Readings from Last 3 Encounters:  12/26/17 253 lb 3.2 oz (114.9 kg)  06/26/17 252 lb 6.4 oz (114.5 kg)  04/28/17 251  lb 6.4 oz (114 kg)   BP Readings from Last 3 Encounters:  12/26/17 116/70  06/26/17 122/68  04/28/17 110/76     Immunization History  Administered Date(s) Administered  . Influenza Split 08/06/2012  . Influenza Whole 11/04/2000, 08/19/2007, 07/19/2009  . Influenza,inj,Quad PF,6+ Mos 08/16/2013, 07/15/2014, 08/13/2016  . Influenza-Unspecified 07/06/2015, 08/05/2017  . Pneumococcal Polysaccharide-23 06/04/2012, 12/13/2016  . Td 07/06/1999, 05/16/2009  . Zoster 07/15/2014    Health Maintenance  Topic Date Due  . HEMOGLOBIN A1C  10/08/2017  . OPHTHALMOLOGY EXAM  11/11/2017  . FOOT EXAM  12/26/2018  . TETANUS/TDAP  05/17/2019  . MAMMOGRAM  07/25/2019  . COLONOSCOPY  06/12/2020  . INFLUENZA VACCINE  Completed  . Hepatitis C Screening  Completed  . HIV Screening  Completed    Lab Results  Component Value Date   WBC 10.9 (H) 05/02/2017   HGB 13.6 05/02/2017   HCT 41.2 05/02/2017   PLT 176.0 05/02/2017   GLUCOSE 160 (H) 05/02/2017   CHOL 115 04/08/2017   TRIG 161.0 (H) 04/08/2017   HDL 41.70 04/08/2017   LDLCALC 41 04/08/2017   ALT 30 05/02/2017   AST 29 05/02/2017   NA 141 05/02/2017   K 3.9 05/02/2017   CL 101 05/02/2017   CREATININE 0.77 05/02/2017   BUN 6 05/02/2017   CO2 31 05/02/2017   TSH 1.95 12/13/2016   HGBA1C 6.7 (H) 04/08/2017   MICROALBUR <0.7 03/15/2016    Lab Results  Component Value Date   TSH 1.95 12/13/2016   Lab Results  Component Value Date   WBC 10.9 (H) 05/02/2017   HGB 13.6 05/02/2017   HCT 41.2 05/02/2017   MCV 93.7 05/02/2017   PLT 176.0 05/02/2017   Lab Results  Component Value Date   NA 141 05/02/2017   K 3.9 05/02/2017   CO2 31 05/02/2017    GLUCOSE 160 (H) 05/02/2017   BUN 6 05/02/2017   CREATININE 0.77 05/02/2017   BILITOT 0.6 05/02/2017   ALKPHOS 98 05/02/2017   AST 29 05/02/2017   ALT 30 05/02/2017   PROT 7.6 05/02/2017   ALBUMIN 3.7 05/02/2017   CALCIUM 9.7 05/02/2017   GFR 80.29 05/02/2017   Lab Results  Component Value Date   CHOL 115 04/08/2017   Lab Results  Component Value Date   HDL 41.70 04/08/2017   Lab Results  Component Value Date   LDLCALC 41 04/08/2017   Lab Results  Component Value Date   TRIG 161.0 (H) 04/08/2017   Lab Results  Component Value Date   CHOLHDL 3 04/08/2017   Lab Results  Component Value Date   HGBA1C 6.7 (H) 04/08/2017         Assessment & Plan:   Problem List Items Addressed This Visit      Unprioritized   DM (diabetes mellitus) type II uncontrolled, periph vascular disorder ([place])    hgba1c to be checked, minimize simple carbs. Increase exercise as tolerated. Continue current meds      Relevant Orders   Microalbumin / creatinine urine ratio   Comprehensive metabolic panel   Hemoglobin A1c   Essential hypertension    Well controlled, no changes to meds. Encouraged heart healthy diet such as the DASH diet and exercise as tolerated.       Hyperlipidemia associated with type 2 diabetes mellitus ([place])    Tolerating statin, encouraged heart healthy diet, avoid trans fats, minimize simple carbs and saturated fats. Increase exercise as tolerated       Other  Visit Diagnoses    Hyperlipidemia LDL goal <70    -  Primary   Relevant Orders   Comprehensive metabolic panel   Hemoglobin A1c   Lipid panel   Hypertension associated with diabetes ([place])       Relevant Orders   Microalbumin / creatinine urine ratio   Comprehensive metabolic panel   Lipid panel   Influenza       Relevant Medications   oseltamivir (TAMIFLU) 75 MG capsule      I am having Jacqueline Duran start on oseltamivir. I am also having her maintain her ascorbic acid, aspirin, multivitamin,  Magnesium, MEGARED OMEGA-3 KRILL OIL, Lactobacillus (PROBIOTIC ACIDOPHILUS PO), Turmeric, fluticasone, albuterol, ONETOUCH DELICA LANCETS FINE, glucose blood, atorvastatin, dapagliflozin propanediol, escitalopram, SitaGLIPtin-MetFORMIN HCl, olmesartan-hydrochlorothiazide, and folic acid.  Meds ordered this encounter  Medications  . oseltamivir (TAMIFLU) 75 MG capsule    Sig: Take 1 capsule (75 mg total) by mouth daily.    Dispense:  30 capsule    Refill:  0    CMA served as scribe during this visit. History, Physical and Plan performed by medical provider. Documentation and orders reviewed and attested to.  [name], DO

## 2017-12-26 NOTE — Assessment & Plan Note (Signed)
hgba1c to be checked, minimize simple carbs. Increase exercise as tolerated. Continue current meds  

## 2017-12-26 NOTE — Assessment & Plan Note (Signed)
Well controlled, no changes to meds. Encouraged heart healthy diet such as the DASH diet and exercise as tolerated.  °

## 2017-12-26 NOTE — Assessment & Plan Note (Signed)
Tolerating statin, encouraged heart healthy diet, avoid trans fats, minimize simple carbs and saturated fats. Increase exercise as tolerated 

## 2017-12-30 ENCOUNTER — Other Ambulatory Visit: Payer: Self-pay | Admitting: *Deleted

## 2017-12-30 DIAGNOSIS — IMO0002 Reserved for concepts with insufficient information to code with codable children: Secondary | ICD-10-CM

## 2017-12-30 DIAGNOSIS — E1169 Type 2 diabetes mellitus with other specified complication: Secondary | ICD-10-CM

## 2017-12-30 DIAGNOSIS — E785 Hyperlipidemia, unspecified: Secondary | ICD-10-CM

## 2017-12-30 DIAGNOSIS — E1165 Type 2 diabetes mellitus with hyperglycemia: Principal | ICD-10-CM

## 2017-12-30 DIAGNOSIS — E1151 Type 2 diabetes mellitus with diabetic peripheral angiopathy without gangrene: Secondary | ICD-10-CM

## 2017-12-30 DIAGNOSIS — I10 Essential (primary) hypertension: Secondary | ICD-10-CM

## 2018-03-21 ENCOUNTER — Other Ambulatory Visit: Payer: Self-pay | Admitting: Family Medicine

## 2018-03-21 DIAGNOSIS — IMO0002 Reserved for concepts with insufficient information to code with codable children: Secondary | ICD-10-CM

## 2018-03-21 DIAGNOSIS — E1165 Type 2 diabetes mellitus with hyperglycemia: Principal | ICD-10-CM

## 2018-03-21 DIAGNOSIS — IMO0001 Reserved for inherently not codable concepts without codable children: Secondary | ICD-10-CM

## 2018-03-21 DIAGNOSIS — E1151 Type 2 diabetes mellitus with diabetic peripheral angiopathy without gangrene: Secondary | ICD-10-CM

## 2018-04-16 ENCOUNTER — Ambulatory Visit: Payer: 59 | Admitting: Podiatry

## 2018-04-23 ENCOUNTER — Encounter: Payer: Self-pay | Admitting: Family Medicine

## 2018-06-29 ENCOUNTER — Ambulatory Visit (INDEPENDENT_AMBULATORY_CARE_PROVIDER_SITE_OTHER): Payer: 59 | Admitting: Family Medicine

## 2018-06-29 ENCOUNTER — Encounter: Payer: Self-pay | Admitting: Family Medicine

## 2018-06-29 VITALS — BP 115/47 | HR 88 | Temp 98.6°F | Resp 16 | Ht 63.5 in | Wt 249.6 lb

## 2018-06-29 DIAGNOSIS — E1165 Type 2 diabetes mellitus with hyperglycemia: Secondary | ICD-10-CM | POA: Diagnosis not present

## 2018-06-29 DIAGNOSIS — IMO0001 Reserved for inherently not codable concepts without codable children: Secondary | ICD-10-CM

## 2018-06-29 DIAGNOSIS — E1151 Type 2 diabetes mellitus with diabetic peripheral angiopathy without gangrene: Secondary | ICD-10-CM

## 2018-06-29 DIAGNOSIS — Z0000 Encounter for general adult medical examination without abnormal findings: Secondary | ICD-10-CM

## 2018-06-29 DIAGNOSIS — I10 Essential (primary) hypertension: Secondary | ICD-10-CM | POA: Diagnosis not present

## 2018-06-29 DIAGNOSIS — E783 Hyperchylomicronemia: Secondary | ICD-10-CM

## 2018-06-29 DIAGNOSIS — E785 Hyperlipidemia, unspecified: Secondary | ICD-10-CM

## 2018-06-29 DIAGNOSIS — Z23 Encounter for immunization: Secondary | ICD-10-CM

## 2018-06-29 DIAGNOSIS — IMO0002 Reserved for concepts with insufficient information to code with codable children: Secondary | ICD-10-CM

## 2018-06-29 DIAGNOSIS — E1140 Type 2 diabetes mellitus with diabetic neuropathy, unspecified: Secondary | ICD-10-CM

## 2018-06-29 DIAGNOSIS — F411 Generalized anxiety disorder: Secondary | ICD-10-CM

## 2018-06-29 LAB — LIPID PANEL
Cholesterol: 128 mg/dL (ref 0–200)
HDL: 46.6 mg/dL (ref >39.00)
LDL Cholesterol: 44 mg/dL (ref 0–99)
NONHDL: 80.94
Total CHOL/HDL Ratio: 3
Triglycerides: 183 mg/dL — ABNORMAL HIGH (ref 0.0–149.0)
VLDL: 36.6 mg/dL (ref 0.0–40.0)

## 2018-06-29 LAB — COMPREHENSIVE METABOLIC PANEL
ALK PHOS: 100 U/L (ref 39–117)
ALT: 35 U/L (ref 0–35)
AST: 31 U/L (ref 0–37)
Albumin: 3.9 g/dL (ref 3.5–5.2)
BILIRUBIN TOTAL: 1 mg/dL (ref 0.2–1.2)
BUN: 14 mg/dL (ref 6–23)
CO2: 32 mEq/L (ref 19–32)
Calcium: 9.8 mg/dL (ref 8.4–10.5)
Chloride: 100 mEq/L (ref 96–112)
Creatinine, Ser: 0.89 mg/dL (ref 0.40–1.20)
GFR: 67.69 mL/min (ref >60.00)
GLUCOSE: 197 mg/dL — AB (ref 70–99)
Potassium: 4.3 mEq/L (ref 3.5–5.1)
SODIUM: 140 meq/L (ref 135–145)
TOTAL PROTEIN: 7.1 g/dL (ref 6.0–8.3)

## 2018-06-29 LAB — TSH: TSH: 2.58 u[IU]/mL (ref 0.35–4.50)

## 2018-06-29 LAB — HEMOGLOBIN A1C: Hgb A1c MFr Bld: 7.7 % — ABNORMAL HIGH (ref 4.6–6.5)

## 2018-06-29 MED ORDER — DAPAGLIFLOZIN PROPANEDIOL 10 MG PO TABS: 10.0000 mg | ORAL_TABLET | Freq: Every day | ORAL | 1 refills | Status: DC

## 2018-06-29 MED ORDER — OLMESARTAN MEDOXOMIL-HCTZ 20-12.5 MG PO TABS: 1.0000 | ORAL_TABLET | Freq: Every day | ORAL | 1 refills | Status: DC

## 2018-06-29 MED ORDER — ESCITALOPRAM OXALATE 20 MG PO TABS: 20.0000 mg | ORAL_TABLET | Freq: Every day | ORAL | 1 refills | Status: DC

## 2018-06-29 MED ORDER — SITAGLIP PHOS-METFORMIN HCL ER 50-1000 MG PO TB24: 2.0000 | ORAL_TABLET | Freq: Every day | ORAL | 1 refills | Status: DC

## 2018-06-29 MED ORDER — FOLIC ACID 1 MG PO TABS: 1.0000 mg | ORAL_TABLET | Freq: Every day | ORAL | 3 refills | Status: DC

## 2018-06-29 MED ORDER — GABAPENTIN 100 MG PO CAPS: 100.0000 mg | ORAL_CAPSULE | Freq: Three times a day (TID) | ORAL | 3 refills | Status: DC

## 2018-06-29 MED ORDER — ATORVASTATIN CALCIUM 10 MG PO TABS: 10.0000 mg | ORAL_TABLET | Freq: Every day | ORAL | 1 refills | Status: DC

## 2018-06-29 NOTE — Patient Instructions (Signed)
Preventive Care 40-64 Years, Female Preventive care refers to lifestyle choices and visits with your health care provider that can promote health and wellness. What does preventive care include?  A yearly physical exam. This is also called an annual well check.  Dental exams once or twice a year.  Routine eye exams. Ask your health care provider how often you should have your eyes checked.  Personal lifestyle choices, including: ? Daily care of your teeth and gums. ? Regular physical activity. ? Eating a healthy diet. ? Avoiding tobacco and drug use. ? Limiting alcohol use. ? Practicing safe sex. ? Taking low-dose aspirin daily starting at age 58. ? Taking vitamin and mineral supplements as recommended by your health care provider. What happens during an annual well check? The services and screenings done by your health care provider during your annual well check will depend on your age, overall health, lifestyle risk factors, and family history of disease. Counseling Your health care provider may ask you questions about your:  Alcohol use.  Tobacco use.  Drug use.  Emotional well-being.  Home and relationship well-being.  Sexual activity.  Eating habits.  Work and work Statistician.  Method of birth control.  Menstrual cycle.  Pregnancy history.  Screening You may have the following tests or measurements:  Height, weight, and BMI.  Blood pressure.  Lipid and cholesterol levels. These may be checked every 5 years, or more frequently if you are over 81 years old.  Skin check.  Lung cancer screening. You may have this screening every year starting at age 78 if you have a 30-pack-year history of smoking and currently smoke or have quit within the past 15 years.  Fecal occult blood test (FOBT) of the stool. You may have this test every year starting at age 65.  Flexible sigmoidoscopy or colonoscopy. You may have a sigmoidoscopy every 5 years or a colonoscopy  every 10 years starting at age 30.  Hepatitis C blood test.  Hepatitis B blood test.  Sexually transmitted disease (STD) testing.  Diabetes screening. This is done by checking your blood sugar (glucose) after you have not eaten for a while (fasting). You may have this done every 1-3 years.  Mammogram. This may be done every 1-2 years. Talk to your health care provider about when you should start having regular mammograms. This may depend on whether you have a family history of breast cancer.  BRCA-related cancer screening. This may be done if you have a family history of breast, ovarian, tubal, or peritoneal cancers.  Pelvic exam and Pap test. This may be done every 3 years starting at age 80. Starting at age 36, this may be done every 5 years if you have a Pap test in combination with an HPV test.  Bone density scan. This is done to screen for osteoporosis. You may have this scan if you are at high risk for osteoporosis.  Discuss your test results, treatment options, and if necessary, the need for more tests with your health care provider. Vaccines Your health care provider may recommend certain vaccines, such as:  Influenza vaccine. This is recommended every year.  Tetanus, diphtheria, and acellular pertussis (Tdap, Td) vaccine. You may need a Td booster every 10 years.  Varicella vaccine. You may need this if you have not been vaccinated.  Zoster vaccine. You may need this after age 5.  Measles, mumps, and rubella (MMR) vaccine. You may need at least one dose of MMR if you were born in  1955 or later. You may also need a second dose.  Pneumococcal 13-valent conjugate (PCV13) vaccine. You may need this if you have certain conditions and were not previously vaccinated.  Pneumococcal polysaccharide (PPSV23) vaccine. You may need one or two doses if you smoke cigarettes or if you have certain conditions.  Meningococcal vaccine. You may need this if you have certain  conditions.  Hepatitis A vaccine. You may need this if you have certain conditions or if you travel or work in places where you may be exposed to hepatitis A.  Hepatitis B vaccine. You may need this if you have certain conditions or if you travel or work in places where you may be exposed to hepatitis B.  Haemophilus influenzae type b (Hib) vaccine. You may need this if you have certain conditions.  Talk to your health care provider about which screenings and vaccines you need and how often you need them. This information is not intended to replace advice given to you by your health care provider. Make sure you discuss any questions you have with your health care provider. Document Released: 11/17/2015 Document Revised: 07/10/2016 Document Reviewed: 08/22/2015 Elsevier Interactive Patient Education  2018 [employer]  

## 2018-06-29 NOTE — Addendum Note (Signed)
Addended by: Kem Boroughs D on: 06/29/2018 05:04 PM   Modules accepted: Orders

## 2018-06-29 NOTE — Progress Notes (Signed)
Subjective:     Jacqueline Duran is a 68 y.o. female and is here for a comprehensive physical exam. The patient reports problems - body yeast after starting benicar hct.  HYPERTENSION   Blood pressure range-not checking   Chest pain- no      Dyspnea- no Lightheadedness- no   Edema- no  Other side effects - no   Medication compliance: good Low salt diet- yes    DIABETES    Blood Sugar ranges-not checking   Polyuria- no New Visual problems- no  Hypoglycemic symptoms- no  Other side effects-no Medication compliance - good Last eye exam- 11/2017 Foot exam- today   HYPERLIPIDEMIA  Medication compliance- good RUQ pain- no  Muscle aches- no Other side effects-no     Social History   Socioeconomic History  . Marital status: Widowed    Spouse name: Not on file  . Number of children: Not on file  . Years of education: Not on file  . Highest education level: Not on file  Occupational History  . Occupation: unemployed  Social Needs  . Financial resource strain: Not on file  . Food insecurity:    Worry: Not on file    Inability: Not on file  . Transportation needs:    Medical: Not on file    Non-medical: Not on file  Tobacco Use  . Smoking status: Never Smoker  . Smokeless tobacco: Never Used  Substance and Sexual Activity  . Alcohol use: No  . Drug use: No  . Sexual activity: Not Currently    Partners: Male  Lifestyle  . Physical activity:    Days per week: Not on file    Minutes per session: Not on file  . Stress: Not on file  Relationships  . Social connections:    Talks on phone: Not on file    Gets together: Not on file    Attends religious service: Not on file    Active member of club or organization: Not on file    Attends meetings of clubs or organizations: Not on file    Relationship status: Not on file  . Intimate partner violence:    Fear of current or ex partner: Not on file    Emotionally abused: Not on file    Physically abused: Not on file     Forced sexual activity: Not on file  Other Topics Concern  . Not on file  Social History Narrative   Live at home alone   Health Maintenance  Topic Date Due  . INFLUENZA VACCINE  06/04/2018  . HEMOGLOBIN A1C  06/25/2018  . OPHTHALMOLOGY EXAM  11/18/2018  . FOOT EXAM  12/26/2018  . TETANUS/TDAP  05/17/2019  . MAMMOGRAM  07/25/2019  . COLONOSCOPY  06/12/2020  . Hepatitis C Screening  Completed  . HIV Screening  Completed    The following portions of the patient's history were reviewed and updated as appropriate:  She  has a past medical history of Colon polyps, Deafness, Diabetes mellitus, Hyperlipidemia, and Hypertension. She does not have any pertinent problems on file. She  has a past surgical history that includes Abdominal hysterectomy; Tonsillectomy; Cervical fusion (07/2004); and Colonoscopy (8/11). Her family history includes Alcohol abuse in her other and unknown relative; Cancer in her father; Clotting disorder in her sister; Coronary artery disease in her mother; Factor V Leiden deficiency in her other; Heart attack in her unknown relative; Heart disease in her other; Leukemia in her maternal uncle; Lung cancer in her  father; Stroke in her unknown relative. She  reports that she has never smoked. She has never used smokeless tobacco. She reports that she does not drink alcohol or use drugs. She has a current medication list which includes the following prescription(s): albuterol, ascorbic acid, aspirin, atorvastatin, dapagliflozin propanediol, escitalopram, fluticasone, folic acid, glucose blood, magnesium, multivitamin, olmesartan-hydrochlorothiazide, onetouch delica lancets fine, sitagliptin-metformin hcl, turmeric, and gabapentin. Current Outpatient Medications on File Prior to Visit  Medication Sig Dispense Refill  . albuterol (PROVENTIL HFA;VENTOLIN HFA) 108 (90 Base) MCG/ACT inhaler Inhale 2 puffs into the lungs every 6 (six) hours as needed for wheezing or shortness of  breath. 1 Inhaler 2  . ascorbic acid (VITAMIN C) 500 MG tablet Take 1,000 mg by mouth daily.     Marland Kitchen aspirin 81 MG tablet Take 81 mg by mouth daily.      . fluticasone (FLOVENT HFA) 110 MCG/ACT inhaler Inhale 2 puffs into the lungs 2 (two) times daily. 1 Inhaler 0  . glucose blood (ONE TOUCH ULTRA TEST) test strip Check blood sugar once daily. Dx:E11.9 100 each 5  . Magnesium 500 MG TABS Take 1 tablet by mouth daily.    . Multiple Vitamin (MULTIVITAMIN) tablet Take 1 tablet by mouth daily.      [name] DELICA LANCETS FINE MISC Check blood sugar once daily. Dx:E11.9 100 each 5  . Turmeric 500 MG CAPS Take 1 capsule by mouth daily.     No current facility-administered medications on file prior to visit.    She is allergic to amlodipine besylate and other..  Review of Systems Review of Systems  Constitutional: Negative for activity change, appetite change and fatigue.  HENT: Negative for hearing loss, congestion, tinnitus and ear discharge.  dentist q17m Eyes: Negative for visual disturbance (see optho q1y -- vision corrected to 20/20 with glasses).  Respiratory: Negative for cough, chest tightness and shortness of breath.   Cardiovascular: Negative for chest pain, palpitations and leg swelling.  Gastrointestinal: Negative for abdominal pain, diarrhea, constipation and abdominal distention.  Genitourinary: Negative for urgency, frequency, decreased urine volume and difficulty urinating.  Musculoskeletal: Negative for back pain, arthralgias and gait problem.  Skin: Negative for color change, pallor and rash.  Neurological: Negative for dizziness, light-headedness, numbness and headaches.  Hematological: Negative for adenopathy. Does not bruise/bleed easily.  Psychiatric/Behavioral: Negative for suicidal ideas, confusion, sleep disturbance, self-injury, dysphoric mood, decreased concentration and agitation.       Objective:    BP (!) 115/47 (BP Location: Right Arm, Cuff Size: Large)    Pulse 88   Temp 98.6 F (37 C) (Oral)   Resp 16   Ht 5' 3.5" (1.613 m)   Wt 249 lb 9.6 oz (113.2 kg)   SpO2 95%   BMI 43.52 kg/m  General appearance: alert, cooperative, appears stated age and no distress Head: Normocephalic, without obvious abnormality, atraumatic Eyes: negative findings: lids and lashes normal and pupils equal, round, reactive to light and accomodation Ears: normal TM's and external ear canals both ears Nose: Nares normal. Septum midline. Mucosa normal. No drainage or sinus tenderness. Throat: lips, mucosa, and tongue normal; teeth and gums normal Neck: no adenopathy, no carotid bruit, no JVD, supple, symmetrical, trachea midline and thyroid not enlarged, symmetric, no tenderness/mass/nodules Back: symmetric, no curvature. ROM normal. No CVA tenderness. Lungs: clear to auscultation bilaterally Breasts: normal appearance, no masses or tenderness Heart: regular rate and rhythm, S1, S2 normal, no murmur, click, rub or gallop Abdomen: soft, non-tender; bowel sounds normal; no  masses,  no organomegaly Pelvic: not indicated; status post hysterectomy, negative ROS Extremities: extremities normal, atraumatic, no cyanosis or edema Pulses: 2+ and symmetric Skin: Skin color, texture, turgor normal. No rashes or lesions Lymph nodes: Cervical, supraclavicular, and axillary nodes normal. Neurologic: Alert and oriented X 3, normal strength and tone. Normal symmetric reflexes. Normal coordination and gait   . Diabetic Foot Exam - Simple   Simple Foot Form Diabetic Foot exam was performed with the following findings:  Yes 06/29/2018  9:14 AM  Visual Inspection No deformities, no ulcerations, no other skin breakdown bilaterally:  Yes Sensation Testing See comments:  Yes Pulse Check Posterior Tibialis and Dorsalis pulse intact bilaterally:  Yes Comments Pt unable to feel monfilament in toes Bottom and sides of feet have feeling    Assessment:    Healthy female exam.      Plan:    ghm utd Check labs  See After Visit Summary for Counseling Recommendations    1. Preventative health care See above - Hemoglobin A1c - CBC with Differential/Platelet - Comprehensive metabolic panel - Lipid panel - TSH  2. Hyperlipidemia, group D Tolerating statin, encouraged heart healthy diet, avoid trans fats, minimize simple carbs and saturated fats. Increase exercise as tolerated - Comprehensive metabolic panel - Lipid panel  3. Essential hypertension Well controlled, no changes to meds. Encouraged heart healthy diet such as the DASH diet and exercise as tolerated.  - Hemoglobin A1c - CBC with Differential/Platelet - Comprehensive metabolic panel - Lipid panel - TSH - olmesartan-hydrochlorothiazide (BENICAR HCT) 20-12.5 MG tablet; Take 1 tablet by mouth daily.  Dispense: 90 tablet; Refill: 1  4. DM (diabetes mellitus) type II uncontrolled, periph vascular disorder (HCC) - Hemoglobin A1c - SitaGLIPtin-MetFORMIN HCl (JANUMET XR) 50-1000 MG TB24; Take 2 tablets by mouth daily.  Dispense: 180 tablet; Refill: 1 - dapagliflozin propanediol (FARXIGA) 10 MG TABS tablet; Take 10 mg by mouth daily.  Dispense: 90 tablet; Refill: 1  5. Uncontrolled type 2 diabetes mellitus without complication, without long-term current use of insulin ([place]) hgba1c to be checked, minimize simple carbs. Increase exercise as tolerated. Continue current meds  - folic acid (FOLVITE) 1 MG tablet; Take 1 tablet (1 mg total) by mouth daily.  Dispense: 90 tablet; Refill: 3 - dapagliflozin propanediol (FARXIGA) 10 MG TABS tablet; Take 10 mg by mouth daily.  Dispense: 90 tablet; Refill: 1  6. Generalized anxiety disorder Stable, con't meds  - escitalopram (LEXAPRO) 20 MG tablet; Take 1 tablet (20 mg total) by mouth daily.  Dispense: 90 tablet; Refill: 1  7. Hyperlipidemia, unspecified hyperlipidemia type Tolerating statin, encouraged heart healthy diet, avoid trans fats, minimize simple carbs and  saturated fats. Increase exercise as tolerated - atorvastatin (LIPITOR) 10 MG tablet; Take 1 tablet (10 mg total) by mouth daily.  Dispense: 90 tablet; Refill: 1  8. Hyperlipidemia LDL goal <70  - atorvastatin (LIPITOR) 10 MG tablet; Take 1 tablet (10 mg total) by mouth daily.  Dispense: 90 tablet; Refill: 1  9. Type 2 diabetes mellitus with diabetic neuropathy, without long-term current use of insulin (HCC) Start with 1 qhs for a few days then bid for a few days then tid Call if dose needs to be increased  - gabapentin (NEURONTIN) 100 MG capsule; Take 1 capsule (100 mg total) by mouth 3 (three) times daily.  Dispense: 90 capsule; Refill: 3

## 2018-07-02 ENCOUNTER — Other Ambulatory Visit: Payer: 59

## 2018-07-03 ENCOUNTER — Other Ambulatory Visit: Payer: 59

## 2018-07-08 ENCOUNTER — Other Ambulatory Visit: Payer: Self-pay | Admitting: *Deleted

## 2018-07-08 ENCOUNTER — Telehealth: Payer: Self-pay

## 2018-07-08 MED ORDER — EXENATIDE ER 2 MG/0.85ML ~~LOC~~ AUIJ: 2.0000 mg | AUTO-INJECTOR | SUBCUTANEOUS | 2 refills | Status: DC

## 2018-07-08 NOTE — Telephone Encounter (Signed)
Bydureon not covered- formulary alternatives: Victoza (Pt has already tried and failed), Ozempic and Trulicity. Pt must fail 3 out of 3 to qualify.

## 2018-07-09 ENCOUNTER — Other Ambulatory Visit: Payer: Self-pay | Admitting: Family Medicine

## 2018-07-09 DIAGNOSIS — E1151 Type 2 diabetes mellitus with diabetic peripheral angiopathy without gangrene: Secondary | ICD-10-CM

## 2018-07-09 DIAGNOSIS — IMO0002 Reserved for concepts with insufficient information to code with codable children: Secondary | ICD-10-CM

## 2018-07-09 DIAGNOSIS — E1165 Type 2 diabetes mellitus with hyperglycemia: Principal | ICD-10-CM

## 2018-07-09 MED ORDER — SEMAGLUTIDE(0.25 OR 0.5MG/DOS) 2 MG/1.5ML ~~LOC~~ SOPN: 0.2500 mg | PEN_INJECTOR | SUBCUTANEOUS | 2 refills | Status: DC

## 2018-07-09 NOTE — Telephone Encounter (Signed)
ozempic 0.5 mg  0.25 mg sq daily x 1 month Then inc to 0.5 mg if needed Sent to pharmacy

## 2018-07-13 ENCOUNTER — Other Ambulatory Visit: Payer: Self-pay | Admitting: Family Medicine

## 2018-07-13 DIAGNOSIS — E1140 Type 2 diabetes mellitus with diabetic neuropathy, unspecified: Secondary | ICD-10-CM

## 2018-07-23 ENCOUNTER — Encounter: Payer: Self-pay | Admitting: Family Medicine

## 2018-07-23 ENCOUNTER — Other Ambulatory Visit: Payer: Self-pay

## 2018-07-26 LAB — HM MAMMOGRAPHY

## 2018-07-29 ENCOUNTER — Encounter: Payer: Self-pay | Admitting: *Deleted

## 2018-08-13 ENCOUNTER — Encounter: Payer: Self-pay | Admitting: Family Medicine

## 2018-09-02 ENCOUNTER — Encounter: Payer: Self-pay | Admitting: Family Medicine

## 2018-09-03 ENCOUNTER — Other Ambulatory Visit: Payer: Self-pay | Admitting: Family Medicine

## 2018-09-03 DIAGNOSIS — I10 Essential (primary) hypertension: Secondary | ICD-10-CM

## 2018-09-03 MED ORDER — VALSARTAN-HYDROCHLOROTHIAZIDE 160-12.5 MG PO TABS: 1.0000 | ORAL_TABLET | Freq: Every day | ORAL | 1 refills | Status: DC

## 2018-09-03 NOTE — Telephone Encounter (Signed)
Sent in diovan hct---  bp check with nurse in 2-3 weeks

## 2018-09-04 ENCOUNTER — Ambulatory Visit (INDEPENDENT_AMBULATORY_CARE_PROVIDER_SITE_OTHER): Payer: 59

## 2018-09-04 ENCOUNTER — Ambulatory Visit: Payer: 59

## 2018-09-04 DIAGNOSIS — Z23 Encounter for immunization: Secondary | ICD-10-CM

## 2018-10-06 ENCOUNTER — Other Ambulatory Visit: Payer: Self-pay | Admitting: Family Medicine

## 2018-10-06 DIAGNOSIS — I10 Essential (primary) hypertension: Secondary | ICD-10-CM

## 2018-10-08 ENCOUNTER — Encounter: Payer: Self-pay | Admitting: Family Medicine

## 2018-10-08 DIAGNOSIS — I10 Essential (primary) hypertension: Secondary | ICD-10-CM

## 2018-10-08 MED ORDER — VALSARTAN-HYDROCHLOROTHIAZIDE 160-12.5 MG PO TABS: 1.0000 | ORAL_TABLET | Freq: Every day | ORAL | 1 refills | Status: DC

## 2018-11-09 ENCOUNTER — Encounter: Payer: Self-pay | Admitting: Family Medicine

## 2018-11-11 MED ORDER — INSULIN PEN NEEDLE 32G X 6 MM MISC: 3 refills | Status: DC

## 2018-11-13 MED ORDER — INSULIN PEN NEEDLE 32G X 6 MM MISC: 3 refills | Status: DC

## 2018-11-13 NOTE — Addendum Note (Signed)
Addended byDamita Dunnings D on: 11/13/2018 12:33 PM   Modules accepted: Orders

## 2018-11-30 LAB — HM DIABETES EYE EXAM

## 2018-12-01 ENCOUNTER — Encounter: Payer: Self-pay | Admitting: Family Medicine

## 2018-12-03 ENCOUNTER — Encounter: Payer: Self-pay | Admitting: *Deleted

## 2018-12-11 ENCOUNTER — Other Ambulatory Visit: Payer: Self-pay | Admitting: Family Medicine

## 2018-12-11 DIAGNOSIS — E1165 Type 2 diabetes mellitus with hyperglycemia: Principal | ICD-10-CM

## 2018-12-11 DIAGNOSIS — IMO0002 Reserved for concepts with insufficient information to code with codable children: Secondary | ICD-10-CM

## 2018-12-11 DIAGNOSIS — E1151 Type 2 diabetes mellitus with diabetic peripheral angiopathy without gangrene: Secondary | ICD-10-CM

## 2018-12-11 MED ORDER — SEMAGLUTIDE(0.25 OR 0.5MG/DOS) 2 MG/1.5ML ~~LOC~~ SOPN: 0.2500 mg | PEN_INJECTOR | SUBCUTANEOUS | 2 refills | Status: DC

## 2018-12-11 NOTE — Telephone Encounter (Signed)
Requested Prescriptions  Pending Prescriptions Disp Refills  . Semaglutide,0.25 or 0.5MG /DOS, (OZEMPIC, 0.25 OR 0.5 MG/DOSE,) 2 MG/1.5ML SOPN 4 pen 2    Sig: Inject 0.25 mg into the skin once a week.     Endocrinology:  Diabetes - GLP-1 Receptor Agonists Passed - 12/11/2018 12:00 PM      Passed - HBA1C is between 0 and 7.9 and within 180 days    Hgb A1c MFr Bld  Date Value Ref Range Status  06/29/2018 7.7 (H) 4.6 - 6.5 % Final    Comment:    Glycemic Control Guidelines for People with Diabetes:Non Diabetic:  <6%Goal of Therapy: <7%Additional Action Suggested:  >8%          Passed - Valid encounter within last 6 months    Recent Outpatient Visits          5 months ago Preventative health care   [facility] [place], [place]   11 months ago Hyperlipidemia LDL goal <70   [facility] [place]   1 year ago DM (diabetes mellitus) type II uncontrolled, periph vascular disorder ([place])   [facility] [place]   1 year ago RUQ pain   [facility] [place]   1 year ago Preventative health care   [facility] [place]      Future Appointments            In 2 weeks [place], [place], [facility]

## 2018-12-11 NOTE — Telephone Encounter (Signed)
Copied from [place] [id number]. Topic: Quick Communication - Rx Refill/Question >> Dec 11, 2018 11:18 AM Leward Quan A wrote: Medication: Semaglutide (OZEMPIC) 0.25 or 0.5 MG/DOSE SOPN  Has the patient contacted their pharmacy? Yes.   (Agent: If no, request that the patient contact the pharmacy for the refill.) (Agent: If yes, when and what did the pharmacy advise?)  Preferred Pharmacy (with phone number or street name): [facility], [place]. [address] [phone] (Phone) [phone] (Fax)    Agent: Please be advised that RX refills may take up to 3 business days. We ask that you follow-up with your pharmacy.

## 2018-12-31 ENCOUNTER — Ambulatory Visit: Payer: 59 | Admitting: Family Medicine

## 2019-01-01 ENCOUNTER — Ambulatory Visit: Payer: 59 | Admitting: Family Medicine

## 2019-01-08 ENCOUNTER — Encounter: Payer: Self-pay | Admitting: Family Medicine

## 2019-01-08 ENCOUNTER — Ambulatory Visit: Payer: 59 | Admitting: Family Medicine

## 2019-01-08 VITALS — BP 118/84 | HR 97 | Temp 98.7°F | Wt 247.0 lb

## 2019-01-08 DIAGNOSIS — I10 Essential (primary) hypertension: Secondary | ICD-10-CM | POA: Diagnosis not present

## 2019-01-08 DIAGNOSIS — E1169 Type 2 diabetes mellitus with other specified complication: Secondary | ICD-10-CM

## 2019-01-08 DIAGNOSIS — E2839 Other primary ovarian failure: Secondary | ICD-10-CM | POA: Diagnosis not present

## 2019-01-08 DIAGNOSIS — IMO0002 Reserved for concepts with insufficient information to code with codable children: Secondary | ICD-10-CM

## 2019-01-08 DIAGNOSIS — E1151 Type 2 diabetes mellitus with diabetic peripheral angiopathy without gangrene: Secondary | ICD-10-CM

## 2019-01-08 DIAGNOSIS — E1165 Type 2 diabetes mellitus with hyperglycemia: Secondary | ICD-10-CM | POA: Diagnosis not present

## 2019-01-08 DIAGNOSIS — Z23 Encounter for immunization: Secondary | ICD-10-CM

## 2019-01-08 DIAGNOSIS — E785 Hyperlipidemia, unspecified: Secondary | ICD-10-CM | POA: Diagnosis not present

## 2019-01-08 LAB — COMPREHENSIVE METABOLIC PANEL
ALT: 28 U/L (ref 0–35)
AST: 28 U/L (ref 0–37)
Albumin: 4 g/dL (ref 3.5–5.2)
Alkaline Phosphatase: 105 U/L (ref 39–117)
BUN: 15 mg/dL (ref 6–23)
CO2: 31 mEq/L (ref 19–32)
Calcium: 9.4 mg/dL (ref 8.4–10.5)
Chloride: 102 mEq/L (ref 96–112)
Creatinine, Ser: 0.85 mg/dL (ref 0.40–1.20)
GFR: 67.04 mL/min (ref >60.00)
Glucose, Bld: 129 mg/dL — ABNORMAL HIGH (ref 70–99)
Potassium: 4.6 mEq/L (ref 3.5–5.1)
Sodium: 141 mEq/L (ref 135–145)
Total Bilirubin: 0.7 mg/dL (ref 0.2–1.2)
Total Protein: 7 g/dL (ref 6.0–8.3)

## 2019-01-08 LAB — LIPID PANEL
Cholesterol: 128 mg/dL (ref 0–200)
HDL: 47.9 mg/dL (ref >39.00)
LDL Cholesterol: 45 mg/dL (ref 0–99)
NonHDL: 79.74
TRIGLYCERIDES: 175 mg/dL — AB (ref 0.0–149.0)
Total CHOL/HDL Ratio: 3
VLDL: 35 mg/dL (ref 0.0–40.0)

## 2019-01-08 LAB — HEMOGLOBIN A1C: Hgb A1c MFr Bld: 7.7 % — ABNORMAL HIGH (ref 4.6–6.5)

## 2019-01-08 MED ORDER — PNEUMOCOCCAL 13-VAL CONJ VACC IM SUSP
0.5000 mL | INTRAMUSCULAR | Status: AC
  Administered 2019-01-08: 0.5 mL via INTRAMUSCULAR

## 2019-01-08 NOTE — Patient Instructions (Signed)
Carbohydrate Counting for Diabetes Mellitus, Adult  Carbohydrate counting is a method of keeping track of how many carbohydrates you eat. Eating carbohydrates naturally increases the amount of sugar (glucose) in the blood. Counting how many carbohydrates you eat helps keep your blood glucose within normal limits, which helps you manage your diabetes (diabetes mellitus). It is important to know how many carbohydrates you can safely have in each meal. This is different for every person. A diet and nutrition specialist (registered dietitian) can help you make a meal plan and calculate how many carbohydrates you should have at each meal and snack. Carbohydrates are found in the following foods:  Grains, such as breads and cereals.  Dried beans and soy products.  Starchy vegetables, such as potatoes, peas, and corn.  Fruit and fruit juices.  Milk and yogurt.  Sweets and snack foods, such as cake, cookies, candy, chips, and soft drinks. How do I count carbohydrates? There are two ways to count carbohydrates in food. You can use either of the methods or a combination of both. Reading "Nutrition Facts" on packaged food The "Nutrition Facts" list is included on the labels of almost all packaged foods and beverages in the [place] It includes:  The serving size.  Information about nutrients in each serving, including the grams (g) of carbohydrate per serving. To use the "Nutrition Facts":  Decide how many servings you will have.  Multiply the number of servings by the number of carbohydrates per serving.  The resulting number is the total amount of carbohydrates that you will be having. Learning standard serving sizes of other foods When you eat carbohydrate foods that are not packaged or do not include "Nutrition Facts" on the label, you need to measure the servings in order to count the amount of carbohydrates:  Measure the foods that you will eat with a food scale or measuring cup, if needed.   Decide how many standard-size servings you will eat.  Multiply the number of servings by 15. Most carbohydrate-rich foods have about 15 g of carbohydrates per serving. ? For example, if you eat 8 oz (170 g) of strawberries, you will have eaten 2 servings and 30 g of carbohydrates (2 servings x 15 g = 30 g).  For foods that have more than one food mixed, such as soups and casseroles, you must count the carbohydrates in each food that is included. The following list contains standard serving sizes of common carbohydrate-rich foods. Each of these servings has about 15 g of carbohydrates:   hamburger bun or  English muffin.   oz (15 mL) syrup.   oz (14 g) jelly.  1 slice of bread.  1 six-inch tortilla.  3 oz (85 g) cooked rice or pasta.  4 oz (113 g) cooked dried beans.  4 oz (113 g) starchy vegetable, such as peas, corn, or potatoes.  4 oz (113 g) hot cereal.  4 oz (113 g) mashed potatoes or  of a large baked potato.  4 oz (113 g) canned or frozen fruit.  4 oz (120 mL) fruit juice.  4-6 crackers.  6 chicken nuggets.  6 oz (170 g) unsweetened dry cereal.  6 oz (170 g) plain fat-free yogurt or yogurt sweetened with artificial sweeteners.  8 oz (240 mL) milk.  8 oz (170 g) fresh fruit or one small piece of fruit.  24 oz (680 g) popped popcorn. Example of carbohydrate counting Sample meal  3 oz (85 g) chicken breast.  6 oz (170 g)   brown rice.  4 oz (113 g) corn.  8 oz (240 mL) milk.  8 oz (170 g) strawberries with sugar-free whipped topping. Carbohydrate calculation 1. Identify the foods that contain carbohydrates: ? Rice. ? Corn. ? Milk. ? Strawberries. 2. Calculate how many servings you have of each food: ? 2 servings rice. ? 1 serving corn. ? 1 serving milk. ? 1 serving strawberries. 3. Multiply each number of servings by 15 g: ? 2 servings rice x 15 g = 30 g. ? 1 serving corn x 15 g = 15 g. ? 1 serving milk x 15 g = 15 g. ? 1 serving  strawberries x 15 g = 15 g. 4. Add together all of the amounts to find the total grams of carbohydrates eaten: ? 30 g + 15 g + 15 g + 15 g = 75 g of carbohydrates total. Summary  Carbohydrate counting is a method of keeping track of how many carbohydrates you eat.  Eating carbohydrates naturally increases the amount of sugar (glucose) in the blood.  Counting how many carbohydrates you eat helps keep your blood glucose within normal limits, which helps you manage your diabetes.  A diet and nutrition specialist (registered dietitian) can help you make a meal plan and calculate how many carbohydrates you should have at each meal and snack. This information is not intended to replace advice given to you by your health care provider. Make sure you discuss any questions you have with your health care provider. Document Released: 10/21/2005 Document Revised: 04/30/2017 Document Reviewed: 04/03/2016 Elsevier Interactive Patient Education  2019 [employer]  

## 2019-01-08 NOTE — Progress Notes (Signed)
Patient ID: PAKOU RAINBOW, female    DOB: [birth year]  Age: 68 y.o. MRN: [id number]    Subjective:  Subjective  HPI Jacqueline Duran presents for f/u dm , chol and bp.   HYPERTENSION   Blood pressure range-not checking   Chest pain- no      Dyspnea- no Lightheadedness- no   Edema- no  Other side effects - no   Medication compliance: good Low salt diet- yes    DIABETES    Blood Sugar ranges-not checking   Polyuria- no New Visual problems- no  Hypoglycemic symptoms- no  Other side effects-no Medication compliance - good Last eye exam- 11/2018 Foot exam- today   HYPERLIPIDEMIA  Medication compliance- good RUQ pain- no  Muscle aches- no Other side effects-no   Review of Systems  Constitutional: Positive for fever. Negative for appetite change, diaphoresis, fatigue and unexpected weight change.  Eyes: Negative for pain, redness and visual disturbance.  Respiratory: Negative for cough, chest tightness, shortness of breath and wheezing.   Cardiovascular: Negative for chest pain, palpitations and leg swelling.  Endocrine: Negative for cold intolerance, heat intolerance, polydipsia, polyphagia and polyuria.  Genitourinary: Negative for difficulty urinating, dysuria and frequency.  Neurological: Positive for headaches. Negative for dizziness, light-headedness and numbness.    History Past Medical History:  Diagnosis Date  . Colon polyps   . Deafness    Right Ear  . Diabetes mellitus   . Hyperlipidemia   . Hypertension     She has a past surgical history that includes Abdominal hysterectomy; Tonsillectomy; Cervical fusion (07/2004); and Colonoscopy (8/11).   Her family history includes Alcohol abuse in her unknown relative and another family member; Cancer in her father; Clotting disorder in her sister; Coronary artery disease in her mother; Factor V Leiden deficiency in an other family member; Heart attack in her unknown relative; Heart disease in an other family member;  Leukemia in her maternal uncle; Lung cancer in her father; Stroke in her unknown relative.She reports that she has never smoked. She has never used smokeless tobacco. She reports that she does not drink alcohol or use drugs.  Current Outpatient Medications on File Prior to Visit  Medication Sig Dispense Refill  . albuterol (PROVENTIL HFA;VENTOLIN HFA) 108 (90 Base) MCG/ACT inhaler Inhale 2 puffs into the lungs every 6 (six) hours as needed for wheezing or shortness of breath. 1 Inhaler 2  . ascorbic acid (VITAMIN C) 500 MG tablet Take 1,000 mg by mouth daily.     Marland Kitchen aspirin 81 MG tablet Take 81 mg by mouth daily.      Marland Kitchen atorvastatin (LIPITOR) 10 MG tablet Take 1 tablet (10 mg total) by mouth daily. 90 tablet 1  . dapagliflozin propanediol (FARXIGA) 10 MG TABS tablet Take 10 mg by mouth daily. 90 tablet 1  . escitalopram (LEXAPRO) 20 MG tablet Take 1 tablet (20 mg total) by mouth daily. 90 tablet 1  . fluticasone (FLOVENT HFA) 110 MCG/ACT inhaler Inhale 2 puffs into the lungs 2 (two) times daily. 1 Inhaler 0  . folic acid (FOLVITE) 1 MG tablet Take 1 tablet (1 mg total) by mouth daily. 90 tablet 3  . gabapentin (NEURONTIN) 100 MG capsule TAKE 1 CAPSULE BY MOUTH THREE TIMES A DAY 270 capsule 2  . glucose blood (ONE TOUCH ULTRA TEST) test strip Check blood sugar once daily. Dx:E11.9 100 each 5  . Insulin Pen Needle (BD PEN NEEDLE MICRO U/F) 32G X 6 MM MISC To use w/ Ozempic 100  each 3  . Magnesium 500 MG TABS Take 1 tablet by mouth daily.    . Multiple Vitamin (MULTIVITAMIN) tablet Take 1 tablet by mouth daily.      [name] DELICA LANCETS FINE MISC Check blood sugar once daily. Dx:E11.9 100 each 5  . Semaglutide,0.25 or 0.5MG /DOS, (OZEMPIC, 0.25 OR 0.5 MG/DOSE,) 2 MG/1.5ML SOPN Inject 0.25 mg into the skin once a week. 4 pen 2  . Turmeric 500 MG CAPS Take 1 capsule by mouth daily.    . valsartan-hydrochlorothiazide (DIOVAN-HCT) 160-12.5 MG tablet Take 1 tablet by mouth daily. 90 tablet 1   No  current facility-administered medications on file prior to visit.      Objective:  Objective  Physical Exam Constitutional:      Appearance: She is well-developed.  HENT:     Head: Normocephalic and atraumatic.  Eyes:     Conjunctiva/sclera: Conjunctivae normal.  Neck:     Musculoskeletal: Normal range of motion and neck supple.     Thyroid: No thyromegaly.     Vascular: No carotid bruit or JVD.  Cardiovascular:     Rate and Rhythm: Normal rate and regular rhythm.     Heart sounds: Normal heart sounds. No murmur.  Pulmonary:     Effort: Pulmonary effort is normal. No respiratory distress.     Breath sounds: Normal breath sounds. No wheezing or rales.  Chest:     Chest wall: No tenderness.  Neurological:     Mental Status: She is alert and oriented to person, place, and time.    Diabetic Foot Exam - Simple   Simple Foot Form Diabetic Foot exam was performed with the following findings:  Yes 01/08/2019  9:23 AM  Visual Inspection No deformities, no ulcerations, no other skin breakdown bilaterally:  Yes Sensation Testing See comments:  Yes Pulse Check Posterior Tibialis and Dorsalis pulse intact bilaterally:  Yes Comments Pt unable to feel monofilament      BP 118/84   Pulse 97   Temp 98.7 F (37.1 C)   Wt 247 lb (112 kg)   SpO2 96%   BMI 43.07 kg/m  Wt Readings from Last 3 Encounters:  01/08/19 247 lb (112 kg)  06/29/18 249 lb 9.6 oz (113.2 kg)  12/26/17 253 lb 3.2 oz (114.9 kg)     Lab Results  Component Value Date   WBC 10.9 (H) 05/02/2017   HGB 13.6 05/02/2017   HCT 41.2 05/02/2017   PLT 176.0 05/02/2017   GLUCOSE 129 (H) 01/08/2019   CHOL 128 01/08/2019   TRIG 175.0 (H) 01/08/2019   HDL 47.90 01/08/2019   LDLCALC 45 01/08/2019   ALT 28 01/08/2019   AST 28 01/08/2019   NA 141 01/08/2019   K 4.6 01/08/2019   CL 102 01/08/2019   CREATININE 0.85 01/08/2019   BUN 15 01/08/2019   CO2 31 01/08/2019   TSH 2.58 06/29/2018   HGBA1C 7.7 (H) 01/08/2019    MICROALBUR <0.7 12/26/2017    US Abdomen Complete  Result Date: 04/28/2017 CLINICAL DATA:  A week of right upper quadrant abdominal pain. Elevated white blood cell count today. History of diabetes and hyper tension. EXAM: ABDOMEN ULTRASOUND COMPLETE COMPARISON:  Abdominal ultrasound of September 05, 2010 FINDINGS: The study is limited due to excessive bowel gas. Gallbladder: No gallstones or wall thickening visualized. No sonographic Murphy sign noted by sonographer. Common bile duct: Diameter: 4.5 mm Liver: The hepatic echotexture is increased. Decreased density adjacent to the gallbladder likely reflects focal fatty  sparing. No discrete hepatic mass is observed. Portal venous flow was normal in direction toward the liver. There is no intrahepatic ductal dilation. IVC: Visualization is limited due to bowel gas. Pancreas: Visualization of the pancreas is limited by bowel gas. Spleen: Size and appearance within normal limits. Right Kidney: Length: 11.2 cm. Echogenicity within normal limits. No mass or hydronephrosis visualized. Left Kidney: Length: 9.9 cm. Echogenicity within normal limits. No mass or hydronephrosis visualized. Abdominal aorta: Bowel gas limits evaluation of the abdominal aorta. Other findings: There is no ascites. IMPRESSION: No gallstones or sonographic evidence of acute cholecystitis. If there are clinical concerns of gallbladder dysfunction, a nuclear medicine hepatobiliary scan with gallbladder ejection fraction may be useful. Hepatic steatosis. No acute intra-abdominal abnormality is observed. Electronically Signed   By: David  Martinique M.D.   On: 04/28/2017 15:05   Ct Abdomen Pelvis W Contrast  Addendum Date: 04/28/2017   ADDENDUM REPORT: 04/28/2017 18:38 ADDENDUM: These results will be called to the ordering clinician or representative by the Radiologist Assistant, and communication documented in the PACS or zVision Dashboard. Electronically Signed   By: Donavan Foil M.D.   On:  04/28/2017 18:38   Result Date: 04/28/2017 CLINICAL DATA:  Right-sided rib pain for 1 week history of gallbladder problems EXAM: CT ABDOMEN AND PELVIS WITH CONTRAST TECHNIQUE: Multidetector CT imaging of the abdomen and pelvis was performed using the standard protocol following bolus administration of intravenous contrast. CONTRAST:  114mL ISOVUE-300 IOPAMIDOL (ISOVUE-300) INJECTION 61% COMPARISON:  04/28/2017, 09/11/2010 FINDINGS: Lower chest: Lung bases demonstrate no acute consolidation or pleural effusion. 4 mm right middle lobe pulmonary nodule, series 4, image number 1. Normal heart size. Hepatobiliary: Hepatic steatosis. No biliary dilatation or calcified gallstones. Pancreas: Head and uncinate process appear slightly enlarged and indistinct. Mild hazy edema surrounding the head and uncinate process. No focal fluid collections. Spleen: Accessory splenule.  Spleen otherwise normal. Adrenals/Urinary Tract: Adrenal glands are unremarkable. Kidneys are normal, without renal calculi, focal lesion, or hydronephrosis. Bladder is unremarkable. Stomach/Bowel: Stomach is within normal limits. Appendix appears normal. No significant wall thickening. Scattered sigmoid colon diverticula without acute inflammation. Vascular/Lymphatic: Aortic atherosclerosis. No enlarged pelvic lymph nodes. Subcentimeter peripancreatic lymph nodes. Normal portal vein and splenic vein enhancement. Reproductive: Status post hysterectomy. No adnexal masses. Other: No free air. No significant free fluid. Small fat in the umbilicus. Musculoskeletal: No acute or suspicious bone lesion. Degenerative changes of the upper lumbar spine. Grade 1 anterolisthesis of L4 on L5. IMPRESSION: 1. Slightly enlarged and indistinct appearing pancreatic head and uncinate process with mild surrounding edema, the findings are suspicious for a mild pancreatitis. No focal fluid collections are seen. Suggest correlation with laboratory values. 2. Hepatic steatosis  3. Sigmoid colon diverticular disease without acute inflammation. 4. 4 mm right middle lobe pulmonary nodule. No follow-up needed if patient is low-risk. Non-contrast chest CT can be considered in 12 months if patient is high-risk. This recommendation follows the consensus statement: Guidelines for Management of Incidental Pulmonary Nodules Detected on CT Images: From the Fleischner Society 2017; Radiology 2017; 284:228-243. Electronically Signed: By: Donavan Foil M.D. On: 04/28/2017 18:12     Assessment & Plan:  Plan  I am having Jacqueline M. Agrawal "Wynetta Emery" maintain her ascorbic acid, aspirin, multivitamin, Magnesium, Turmeric, fluticasone, albuterol, OneTouch Delica Lancets Fine, glucose blood, folic acid, dapagliflozin propanediol, escitalopram, atorvastatin, gabapentin, valsartan-hydrochlorothiazide, Insulin Pen Needle, and Semaglutide(0.25 or 0.5MG /DOS). We administered pneumococcal 13-valent conjugate vaccine.  Meds ordered this encounter  Medications  . pneumococcal 13-valent conjugate  vaccine (PREVNAR 13) injection 0.5 mL    Problem List Items Addressed This Visit      Unprioritized   DM (diabetes mellitus) type II uncontrolled, periph vascular disorder ([place])    hgba1c to be checked , minimize simple carbs. Increase exercise as tolerated. Continue current meds       Relevant Orders   Lipid panel (Completed)   Hemoglobin A1c (Completed)   Comprehensive metabolic panel (Completed)   Essential hypertension    Well controlled, no changes to meds. Encouraged heart healthy diet such as the DASH diet and exercise as tolerated.       Hyperlipidemia associated with type 2 diabetes mellitus ([place])    Encouraged heart healthy diet, increase exercise, avoid trans fats, consider a krill oil cap daily       Relevant Orders   Lipid panel (Completed)   Hemoglobin A1c (Completed)   Comprehensive metabolic panel (Completed)    Other Visit Diagnoses    Estrogen deficiency    -  Primary     Relevant Orders   DG Bone Density   Need for pneumococcal vaccination       Relevant Medications   pneumococcal 13-valent conjugate vaccine (PREVNAR 13) injection 0.5 mL (Completed)      Follow-up: Return in about 6 months (around 07/11/2019) for annual exam, fasting.  [name], DO

## 2019-01-09 NOTE — Assessment & Plan Note (Signed)
hgba1c to be checked, minimize simple carbs. Increase exercise as tolerated. Continue current meds  

## 2019-01-09 NOTE — Assessment & Plan Note (Signed)
Encouraged heart healthy diet, increase exercise, avoid trans fats, consider a krill oil cap daily 

## 2019-01-09 NOTE — Assessment & Plan Note (Signed)
Well controlled, no changes to meds. Encouraged heart healthy diet such as the DASH diet and exercise as tolerated.  °

## 2019-01-15 MED ORDER — SEMAGLUTIDE (1 MG/DOSE) 2 MG/1.5ML ~~LOC~~ SOPN: 1.0000 mg | PEN_INJECTOR | SUBCUTANEOUS | 3 refills | Status: DC

## 2019-01-15 NOTE — Addendum Note (Signed)
Addended byDamita Dunnings D on: 01/15/2019 09:42 AM   Modules accepted: Orders

## 2019-03-24 ENCOUNTER — Other Ambulatory Visit: Payer: Self-pay

## 2019-03-24 ENCOUNTER — Ambulatory Visit: Payer: Self-pay | Admitting: *Deleted

## 2019-03-24 ENCOUNTER — Ambulatory Visit (INDEPENDENT_AMBULATORY_CARE_PROVIDER_SITE_OTHER): Payer: 59 | Admitting: Family Medicine

## 2019-03-24 ENCOUNTER — Encounter: Payer: Self-pay | Admitting: Family Medicine

## 2019-03-24 DIAGNOSIS — Z20828 Contact with and (suspected) exposure to other viral communicable diseases: Secondary | ICD-10-CM | POA: Diagnosis not present

## 2019-03-24 DIAGNOSIS — Z20822 Contact with and (suspected) exposure to covid-19: Secondary | ICD-10-CM | POA: Insufficient documentation

## 2019-03-24 NOTE — Telephone Encounter (Signed)
Patient calling stating that her office has 3 cases of COVID-19 and is now requiring for everyone who has been in the office to be tested. Pt states she was in the office for 10 min on Friday. No close contact with any individual that tested positive for COVID-19.Pt states that she has experienced a dry cough for the past 2 days that she thinks is mostly sinus. Pt denies any other symptoms. Pt has a history of diabetes. Call transferred to [facility] in the office so that pt could be scheduled for a virtual visit with PCP.   Reason for Disposition . [1] Fever (or feeling feverish) OR cough occurs AND [2] living in area with major community spread AND [3] testing being done in the community for symptoms  Answer Assessment - Initial Assessment Questions 1. CLOSE CONTACT: "Who is the person with the confirmed or suspected COVID-19 infection that you were exposed to?"     No confirmed contact with a positive person 2. PLACE of CONTACT: "Where were you when you were exposed to COVID-19?" (e.g., home, school, medical waiting room; which city?)     In the workplace 3. TYPE of CONTACT: "How much contact was there?" (e.g., sitting next to, live in same house, work in same office, same building)     In the same office building  4. DURATION of CONTACT: "How long were you in contact with the COVID-19 patient?" (e.g., a few seconds, passed by person, a few minutes, live with the patient)     No personal contact with anyone, working for home but did go into the office for 30 min Thursday evening last Friday 10 min 5. DATE of CONTACT: "When did you have contact with a COVID-19 patient?" (e.g., how many days ago)     Last contact in the office was on Friday 6. TRAVEL: "Have you traveled out of the country recently?" If so, "When and where?"     * Also ask about out-of-state travel, since the CDC has identified some high risk cities for community spread in the [place].     * Note: Travel becomes less relevant if there is  widespread community transmission where the patient lives.     No 7. COMMUNITY SPREAD: "Are there lots of cases of COVID-19 (community spread) where you live?" (See public health department website, if unsure)   * MAJOR community spread: high number of cases; numbers of cases are increasing; many people hospitalized.   * MINOR community spread: low number of cases; not increasing; few or no people hospitalized     No 8. SYMPTOMS: "Do you have any symptoms?" (e.g., fever, cough, breathing difficulty)     Non productive for 2 days 9. PREGNANCY OR POSTPARTUM: "Is there any chance you are pregnant?" "When was your last menstrual period?" "Did you deliver in the last 2 weeks?"     n/a 10. HIGH RISK: "Do you have any heart or lung problems? Do you have a weak immune system?" (e.g., CHF, COPD, asthma, HIV positive, chemotherapy, renal failure, diabetes mellitus, sickle cell anemia)      diabetic  Protocols used: CORONAVIRUS (COVID-19) EXPOSURE-A-AH

## 2019-03-24 NOTE — Telephone Encounter (Signed)
Virtual visit scheduled.  

## 2019-03-24 NOTE — Progress Notes (Signed)
Virtual Visit via Video Note  I connected with Jacqueline Duran on 03/24/19 at  2:40 PM EDT by a video enabled telemedicine application and verified that I am speaking with the correct person using two identifiers.  Location: Patient: home Provider: office   I discussed the limitations of evaluation and management by telemedicine and the availability of in person appointments. The patient expressed understanding and agreed to proceed.  History of Present Illness: Pt states her job is requiring she get covid testing because there have been 3 cases at work . She works at [employer]-----they are requiring she get tested Pt has no symptoms    Observations/Objective: No vitals obtained  Pt in NaD   Assessment and Plan: 1. Exposure to Covid-19 Virus Pt given number for [facility] covid testing    Follow Up Instructions:    I discussed the assessment and treatment plan with the patient. The patient was provided an opportunity to ask questions and all were answered. The patient agreed with the plan and demonstrated an understanding of the instructions.   The patient was advised to call back or seek an in-person evaluation if the symptoms worsen or if the condition fails to improve as anticipated.  I provided 15 minutes of non-face-to-face time during this encounter.   [name], DO

## 2019-03-24 NOTE — Assessment & Plan Note (Signed)
Pt given number for [facility] covid site  rto , call prn

## 2019-04-01 ENCOUNTER — Other Ambulatory Visit: Payer: Self-pay | Admitting: *Deleted

## 2019-04-01 DIAGNOSIS — IMO0001 Reserved for inherently not codable concepts without codable children: Secondary | ICD-10-CM

## 2019-04-01 DIAGNOSIS — E1151 Type 2 diabetes mellitus with diabetic peripheral angiopathy without gangrene: Secondary | ICD-10-CM

## 2019-04-01 DIAGNOSIS — E785 Hyperlipidemia, unspecified: Secondary | ICD-10-CM

## 2019-04-01 DIAGNOSIS — IMO0002 Reserved for concepts with insufficient information to code with codable children: Secondary | ICD-10-CM

## 2019-04-01 DIAGNOSIS — F411 Generalized anxiety disorder: Secondary | ICD-10-CM

## 2019-04-01 MED ORDER — DAPAGLIFLOZIN PROPANEDIOL 10 MG PO TABS: 10.0000 mg | ORAL_TABLET | Freq: Every day | ORAL | 1 refills | Status: DC

## 2019-04-01 MED ORDER — ESCITALOPRAM OXALATE 20 MG PO TABS: 20.0000 mg | ORAL_TABLET | Freq: Every day | ORAL | 1 refills | Status: DC

## 2019-04-01 MED ORDER — ATORVASTATIN CALCIUM 10 MG PO TABS: 10.0000 mg | ORAL_TABLET | Freq: Every day | ORAL | 1 refills | Status: DC

## 2019-05-11 ENCOUNTER — Other Ambulatory Visit: Payer: Self-pay | Admitting: Family Medicine

## 2019-06-13 ENCOUNTER — Encounter: Payer: Self-pay | Admitting: Family Medicine

## 2019-06-14 NOTE — Telephone Encounter (Signed)
Next visit is 07/29/19

## 2019-06-14 NOTE — Telephone Encounter (Signed)
Yes---  Have her remind me during her office visit and I will add it

## 2019-06-28 ENCOUNTER — Encounter: Payer: Self-pay | Admitting: Family Medicine

## 2019-06-28 DIAGNOSIS — I10 Essential (primary) hypertension: Secondary | ICD-10-CM

## 2019-06-29 MED ORDER — VALSARTAN-HYDROCHLOROTHIAZIDE 160-12.5 MG PO TABS: 1.0000 | ORAL_TABLET | Freq: Every day | ORAL | 1 refills | Status: DC

## 2019-07-29 ENCOUNTER — Encounter: Payer: 59 | Admitting: Family Medicine

## 2019-07-29 LAB — HM MAMMOGRAPHY

## 2019-08-05 ENCOUNTER — Encounter: Payer: Self-pay | Admitting: Family Medicine

## 2019-08-05 NOTE — Telephone Encounter (Signed)
Printed and abstracted

## 2019-08-10 ENCOUNTER — Encounter: Payer: Self-pay | Admitting: Family Medicine

## 2019-08-20 ENCOUNTER — Other Ambulatory Visit: Payer: Self-pay

## 2019-08-20 ENCOUNTER — Ambulatory Visit (HOSPITAL_BASED_OUTPATIENT_CLINIC_OR_DEPARTMENT_OTHER)
Admission: RE | Admit: 2019-08-20 | Discharge: 2019-08-20 | Disposition: A | Discharge status: Home / Self Care | Payer: 59 | Source: Ambulatory Visit | Attending: Family Medicine | Admitting: Family Medicine

## 2019-08-20 DIAGNOSIS — E2839 Other primary ovarian failure: Secondary | ICD-10-CM | POA: Insufficient documentation

## 2019-09-06 ENCOUNTER — Other Ambulatory Visit: Payer: Self-pay

## 2019-09-07 ENCOUNTER — Other Ambulatory Visit: Payer: Self-pay

## 2019-09-07 ENCOUNTER — Encounter: Payer: Self-pay | Admitting: Family Medicine

## 2019-09-07 ENCOUNTER — Ambulatory Visit (INDEPENDENT_AMBULATORY_CARE_PROVIDER_SITE_OTHER): Payer: 59 | Admitting: Family Medicine

## 2019-09-07 VITALS — BP 112/70 | HR 71 | Temp 97.3°F | Resp 18 | Ht 63.5 in | Wt 240.6 lb

## 2019-09-07 DIAGNOSIS — I10 Essential (primary) hypertension: Secondary | ICD-10-CM

## 2019-09-07 DIAGNOSIS — Z23 Encounter for immunization: Secondary | ICD-10-CM

## 2019-09-07 DIAGNOSIS — E1140 Type 2 diabetes mellitus with diabetic neuropathy, unspecified: Secondary | ICD-10-CM

## 2019-09-07 DIAGNOSIS — E785 Hyperlipidemia, unspecified: Secondary | ICD-10-CM | POA: Diagnosis not present

## 2019-09-07 DIAGNOSIS — E1165 Type 2 diabetes mellitus with hyperglycemia: Secondary | ICD-10-CM

## 2019-09-07 DIAGNOSIS — IMO0002 Reserved for concepts with insufficient information to code with codable children: Secondary | ICD-10-CM

## 2019-09-07 DIAGNOSIS — E1151 Type 2 diabetes mellitus with diabetic peripheral angiopathy without gangrene: Secondary | ICD-10-CM

## 2019-09-07 DIAGNOSIS — E118 Type 2 diabetes mellitus with unspecified complications: Secondary | ICD-10-CM

## 2019-09-07 DIAGNOSIS — E1169 Type 2 diabetes mellitus with other specified complication: Secondary | ICD-10-CM

## 2019-09-07 DIAGNOSIS — F411 Generalized anxiety disorder: Secondary | ICD-10-CM

## 2019-09-07 DIAGNOSIS — Z0000 Encounter for general adult medical examination without abnormal findings: Secondary | ICD-10-CM

## 2019-09-07 DIAGNOSIS — Z8269 Family history of other diseases of the musculoskeletal system and connective tissue: Secondary | ICD-10-CM

## 2019-09-07 DIAGNOSIS — R7989 Other specified abnormal findings of blood chemistry: Secondary | ICD-10-CM

## 2019-09-07 MED ORDER — ATORVASTATIN CALCIUM 10 MG PO TABS: 10.0000 mg | ORAL_TABLET | Freq: Every day | ORAL | 1 refills | Status: DC

## 2019-09-07 MED ORDER — FARXIGA 10 MG PO TABS: 10.0000 mg | ORAL_TABLET | Freq: Every day | ORAL | 1 refills | Status: DC

## 2019-09-07 MED ORDER — VALSARTAN-HYDROCHLOROTHIAZIDE 160-12.5 MG PO TABS: 1.0000 | ORAL_TABLET | Freq: Every day | ORAL | 1 refills | Status: DC

## 2019-09-07 MED ORDER — ESCITALOPRAM OXALATE 20 MG PO TABS: 20.0000 mg | ORAL_TABLET | Freq: Every day | ORAL | 1 refills | Status: DC

## 2019-09-07 MED ORDER — OZEMPIC (1 MG/DOSE) 2 MG/1.5ML ~~LOC~~ SOPN: 1.0000 mg | PEN_INJECTOR | Freq: Every day | SUBCUTANEOUS | 1 refills | Status: DC

## 2019-09-07 NOTE — Assessment & Plan Note (Signed)
Tolerating statin, encouraged heart healthy diet, avoid trans fats, minimize simple carbs and saturated fats. Increase exercise as tolerated 

## 2019-09-07 NOTE — Assessment & Plan Note (Signed)
Well controlled, no changes to meds. Encouraged heart healthy diet such as the DASH diet and exercise as tolerated.  °

## 2019-09-07 NOTE — Assessment & Plan Note (Signed)
hgba1c to be checked, minimize simple carbs. Increase exercise as tolerated. Continue current meds  

## 2019-09-07 NOTE — Patient Instructions (Signed)
Preventive Care 38 Years and Older, Female Preventive care refers to lifestyle choices and visits with your health care provider that can promote health and wellness. This includes:  A yearly physical exam. This is also called an annual well check.  Regular dental and eye exams.  Immunizations.  Screening for certain conditions.  Healthy lifestyle choices, such as diet and exercise. What can I expect for my preventive care visit? Physical exam Your health care provider will check:  Height and weight. These may be used to calculate body mass index (BMI), which is a measurement that tells if you are at a healthy weight.  Heart rate and blood pressure.  Your skin for abnormal spots. Counseling Your health care provider may ask you questions about:  Alcohol, tobacco, and drug use.  Emotional well-being.  Home and relationship well-being.  Sexual activity.  Eating habits.  History of falls.  Memory and ability to understand (cognition).  Work and work Statistician.  Pregnancy and menstrual history. What immunizations do I need?  Influenza (flu) vaccine  This is recommended every year. Tetanus, diphtheria, and pertussis (Tdap) vaccine  You may need a Td booster every 10 years. Varicella (chickenpox) vaccine  You may need this vaccine if you have not already been vaccinated. Zoster (shingles) vaccine  You may need this after age 33. Pneumococcal conjugate (PCV13) vaccine  One dose is recommended after age 33. Pneumococcal polysaccharide (PPSV23) vaccine  One dose is recommended after age 72. Measles, mumps, and rubella (MMR) vaccine  You may need at least one dose of MMR if you were born in 1957 or later. You may also need a second dose. Meningococcal conjugate (MenACWY) vaccine  You may need this if you have certain conditions. Hepatitis A vaccine  You may need this if you have certain conditions or if you travel or work in places where you may be exposed  to hepatitis A. Hepatitis B vaccine  You may need this if you have certain conditions or if you travel or work in places where you may be exposed to hepatitis B. Haemophilus influenzae type b (Hib) vaccine  You may need this if you have certain conditions. You may receive vaccines as individual doses or as more than one vaccine together in one shot (combination vaccines). Talk with your health care provider about the risks and benefits of combination vaccines. What tests do I need? Blood tests  Lipid and cholesterol levels. These may be checked every 5 years, or more frequently depending on your overall health.  Hepatitis C test.  Hepatitis B test. Screening  Lung cancer screening. You may have this screening every year starting at age 39 if you have a 30-pack-year history of smoking and currently smoke or have quit within the past 15 years.  Colorectal cancer screening. All adults should have this screening starting at age 36 and continuing until age 15. Your health care provider may recommend screening at age 23 if you are at increased risk. You will have tests every 1-10 years, depending on your results and the type of screening test.  Diabetes screening. This is done by checking your blood sugar (glucose) after you have not eaten for a while (fasting). You may have this done every 1-3 years.  Mammogram. This may be done every 1-2 years. Talk with your health care provider about how often you should have regular mammograms.  BRCA-related cancer screening. This may be done if you have a family history of breast, ovarian, tubal, or peritoneal cancers.  Other tests  Sexually transmitted disease (STD) testing.  Bone density scan. This is done to screen for osteoporosis. You may have this done starting at age 55. Follow these instructions at home: Eating and drinking  Eat a diet that includes fresh fruits and vegetables, whole grains, lean protein, and low-fat dairy products. Limit  your intake of foods with high amounts of sugar, saturated fats, and salt.  Take vitamin and mineral supplements as recommended by your health care provider.  Do not drink alcohol if your health care provider tells you not to drink.  If you drink alcohol: ? Limit how much you have to 0-1 drink a day. ? Be aware of how much alcohol is in your drink. In the [place] one drink equals one 12 oz bottle of beer (355 mL), one 5 oz glass of wine (148 mL), or one 1 oz glass of hard liquor (44 mL). Lifestyle  Take daily care of your teeth and gums.  Stay active. Exercise for at least 30 minutes on 5 or more days each week.  Do not use any products that contain nicotine or tobacco, such as cigarettes, e-cigarettes, and chewing tobacco. If you need help quitting, ask your health care provider.  If you are sexually active, practice safe sex. Use a condom or other form of protection in order to prevent STIs (sexually transmitted infections).  Talk with your health care provider about taking a low-dose aspirin or statin. What's next?  Go to your health care provider once a year for a well check visit.  Ask your health care provider how often you should have your eyes and teeth checked.  Stay up to date on all vaccines. This information is not intended to replace advice given to you by your health care provider. Make sure you discuss any questions you have with your health care provider. Document Released: 11/17/2015 Document Revised: 10/15/2018 Document Reviewed: 10/15/2018 Elsevier Patient Education  2020 [employer].

## 2019-09-07 NOTE — Progress Notes (Signed)
Subjective:     Jacqueline Duran is a 68 y.o. female and is here for a comprehensive physical exam. The patient reports no problems. She needs labs to f/u bp, chol and dm and she needs to be tested for ankylosing spondylitis ---  New family hx  HYPERTENSION   Blood pressure range-not checking   Chest pain- no      Dyspnea- no Lightheadedness- no   Edema- no  Other side effects - no   Medication compliance: good Low salt diet- no    DIABETES    Blood Sugar ranges-not checking   Polyuria- no New Visual problems- no  Hypoglycemic symptoms- no  Other side effects-no Medication compliance - good Last eye exam- due    HYPERLIPIDEMIA  Medication compliance- good RUQ pain- no  Muscle aches- no Other side effects-no    Social History   Socioeconomic History  . Marital status: Widowed    Spouse name: Not on file  . Number of children: Not on file  . Years of education: Not on file  . Highest education level: Not on file  Occupational History  . Occupation: unemployed  Social Needs  . Financial resource strain: Not on file  . Food insecurity    Worry: Not on file    Inability: Not on file  . Transportation needs    Medical: Not on file    Non-medical: Not on file  Tobacco Use  . Smoking status: Never Smoker  . Smokeless tobacco: Never Used  Substance and Sexual Activity  . Alcohol use: No  . Drug use: No  . Sexual activity: Not Currently    Partners: Male  Lifestyle  . Physical activity    Days per week: Not on file    Minutes per session: Not on file  . Stress: Not on file  Relationships  . Social Herbalist on phone: Not on file    Gets together: Not on file    Attends religious service: Not on file    Active member of club or organization: Not on file    Attends meetings of clubs or organizations: Not on file    Relationship status: Not on file  . Intimate partner violence    Fear of current or ex partner: Not on file    Emotionally abused:  Not on file    Physically abused: Not on file    Forced sexual activity: Not on file  Other Topics Concern  . Not on file  Social History Narrative   Live at home alone   Health Maintenance  Topic Date Due  . HEMOGLOBIN A1C  07/11/2019  . OPHTHALMOLOGY EXAM  12/01/2019  . FOOT EXAM  01/08/2020  . COLONOSCOPY  06/12/2020  . MAMMOGRAM  07/28/2021  . PNA vac Low Risk Adult (2 of 2 - PPSV23) 12/13/2021  . TETANUS/TDAP  09/06/2029  . INFLUENZA VACCINE  Completed  . DEXA SCAN  Completed  . Hepatitis C Screening  Completed    The following portions of the patient's history were reviewed and updated as appropriate: She  has a past medical history of Colon polyps, Deafness, Diabetes mellitus, Hyperlipidemia, and Hypertension. She does not have any pertinent problems on file. She  has a past surgical history that includes Abdominal hysterectomy; Tonsillectomy; Cervical fusion (07/2004); and Colonoscopy (8/11). Her family history includes Alcohol abuse in her unknown relative and another family member; Cancer in her father; Clotting disorder in her sister; Coronary artery disease in her  mother; Factor V Leiden deficiency in an other family member; Heart attack in her unknown relative; Heart disease in an other family member; Leukemia in her maternal uncle; Lung cancer in her father; Stroke in her unknown relative. She  reports that she has never smoked. She has never used smokeless tobacco. She reports that she does not drink alcohol or use drugs. She has a current medication list which includes the following prescription(s): ascorbic acid, aspirin, atorvastatin, farxiga, escitalopram, folic acid, gabapentin, glucose blood, insulin pen needle, magnesium, multivitamin, onetouch delica lancets fine, ozempic (1 mg/dose), and valsartan-hydrochlorothiazide. Current Outpatient Medications on File Prior to Visit  Medication Sig Dispense Refill  . ascorbic acid (VITAMIN C) 500 MG tablet Take 1,000 mg by  mouth daily.     Marland Kitchen aspirin 81 MG tablet Take 81 mg by mouth daily.      . folic acid (FOLVITE) 1 MG tablet Take 1 tablet (1 mg total) by mouth daily. 90 tablet 3  . gabapentin (NEURONTIN) 100 MG capsule TAKE 1 CAPSULE BY MOUTH THREE TIMES A DAY 270 capsule 2  . glucose blood (ONE TOUCH ULTRA TEST) test strip Check blood sugar once daily. Dx:E11.9 100 each 5  . Insulin Pen Needle (BD PEN NEEDLE MICRO U/F) 32G X 6 MM MISC To use w/ Ozempic 100 each 3  . Magnesium 500 MG TABS Take 1 tablet by mouth daily.    . Multiple Vitamin (MULTIVITAMIN) tablet Take 1 tablet by mouth daily.      [name] DELICA LANCETS FINE MISC Check blood sugar once daily. Dx:E11.9 100 each 5   No current facility-administered medications on file prior to visit.    She is allergic to amlodipine besylate and other..  Review of Systems Review of Systems  Constitutional: Negative for activity change, appetite change and fatigue.  HENT: Negative for hearing loss, congestion, tinnitus and ear discharge.  dentist q62mEyes: Negative for visual disturbance (see optho q1y -- vision corrected to 20/20 with glasses).  Respiratory: Negative for cough, chest tightness and shortness of breath.   Cardiovascular: Negative for chest pain, palpitations and leg swelling.  Gastrointestinal: Negative for abdominal pain, diarrhea, constipation and abdominal distention.  Genitourinary: Negative for urgency, frequency, decreased urine volume and difficulty urinating.  Musculoskeletal: Negative for back pain, arthralgias and gait problem.  Skin: Negative for color change, pallor and rash.  Neurological: Negative for dizziness, light-headedness, numbness and headaches.  Hematological: Negative for adenopathy. Does not bruise/bleed easily.  Psychiatric/Behavioral: Negative for suicidal ideas, confusion, sleep disturbance, self-injury, dysphoric mood, decreased concentration and agitation.       Objective:    BP 112/70 (BP Location: Left  Arm, Patient Position: Sitting, Cuff Size: Large)   Pulse 71   Temp (!) 97.3 F (36.3 C) (Temporal)   Resp 18   Ht 5' 3.5" (1.613 m)   Wt 240 lb 9.6 oz (109.1 kg)   SpO2 95%   BMI 41.95 kg/m  General appearance: alert, cooperative, appears stated age and no distress Head: Normocephalic, without obvious abnormality, atraumatic Eyes: negative findings: lids and lashes normal, conjunctivae and sclerae normal and pupils equal, round, reactive to light and accomodation Neck: no adenopathy, no carotid bruit, no JVD, supple, symmetrical, trachea midline and thyroid not enlarged, symmetric, no tenderness/mass/nodules Back: symmetric, no curvature. ROM normal. No CVA tenderness. Lungs: clear to auscultation bilaterally Breasts: normal appearance, no masses or tenderness Heart: regular rate and rhythm, S1, S2 normal, no murmur, click, rub or gallop Abdomen: soft, non-tender; bowel sounds  normal; no masses,  no organomegaly Pelvic: not indicated; status post hysterectomy, negative ROS Extremities: extremities normal, atraumatic, no cyanosis or edema Pulses: 2+ and symmetric Skin: Skin color, texture, turgor normal. No rashes or lesions Lymph nodes: Cervical, supraclavicular, and axillary nodes normal. Neurologic: Alert and oriented X 3, normal strength and tone. Normal symmetric reflexes. Normal coordination and gait    Assessment:    Healthy female exam.      Plan:    ghm utd Check labs  See After Visit Summary for Counseling Recommendations    1. Type 2 diabetes mellitus with diabetic neuropathy, without long-term current use of insulin (HCC) Check labs  hgba1c to be checked , minimize simple carbs. Increase exercise as tolerated. Continue current meds  - Semaglutide, 1 MG/DOSE, (OZEMPIC, 1 MG/DOSE,) 2 MG/1.5ML SOPN; Inject 1 mg into the skin daily.  Dispense: 4 pen; Refill: 1 - Hemoglobin A1c - Comprehensive metabolic panel  2. Essential hypertension Well controlled, no changes  to meds. Encouraged heart healthy diet such as the DASH diet and exercise as tolerated.   - valsartan-hydrochlorothiazide (DIOVAN-HCT) 160-12.5 MG tablet; Take 1 tablet by mouth daily.  Dispense: 90 tablet; Refill: 1  3. Generalized anxiety disorder Stable Refills meds  - escitalopram (LEXAPRO) 20 MG tablet; Take 1 tablet (20 mg total) by mouth daily.  Dispense: 90 tablet; Refill: 1  4. Hyperlipidemia, unspecified hyperlipidemia type Tolerating statin, encouraged heart healthy diet, avoid trans fats, minimize simple carbs and saturated fats. Increase exercise as tolerated - atorvastatin (LIPITOR) 10 MG tablet; Take 1 tablet (10 mg total) by mouth daily.  Dispense: 90 tablet; Refill: 1 - Lipid panel  5. Hyperlipidemia LDL goal <70 - atorvastatin (LIPITOR) 10 MG tablet; Take 1 tablet (10 mg total) by mouth daily.  Dispense: 90 tablet; Refill: 1  6. Uncontrolled type 2 diabetes mellitus with complication, without long-term current use of insulin (HCC) - dapagliflozin propanediol (FARXIGA) 10 MG TABS tablet; Take 10 mg by mouth daily.  Dispense: 90 tablet; Refill: 1  7. DM (diabetes mellitus) type II uncontrolled, periph vascular disorder (HCC)  - dapagliflozin propanediol (FARXIGA) 10 MG TABS tablet; Take 10 mg by mouth daily.  Dispense: 90 tablet; Refill: 1  8. Family history of ankylosing spondylitis  - HLA-B27 Antigen  9. Preventative health care See above  - Lipid panel - Comprehensive metabolic panel - TSH  10. Low plasma total homocysteine  - Homocysteine  11. Need for shingles vaccine  - Varicella-zoster vaccine IM  12. Hyperlipidemia associated with type 2 diabetes mellitus ([place])   13. Anxiety state Stable  14. Need for tetanus booster  - Tdap vaccine greater than or equal to 7yo IM

## 2019-09-07 NOTE — Assessment & Plan Note (Signed)
Stable con't meds 

## 2019-09-08 ENCOUNTER — Other Ambulatory Visit: Payer: Self-pay | Admitting: Family Medicine

## 2019-09-08 ENCOUNTER — Other Ambulatory Visit: Payer: Self-pay

## 2019-09-08 ENCOUNTER — Encounter: Payer: Self-pay | Admitting: Family Medicine

## 2019-09-08 DIAGNOSIS — R7989 Other specified abnormal findings of blood chemistry: Secondary | ICD-10-CM

## 2019-09-08 DIAGNOSIS — E1165 Type 2 diabetes mellitus with hyperglycemia: Secondary | ICD-10-CM

## 2019-09-08 DIAGNOSIS — E1169 Type 2 diabetes mellitus with other specified complication: Secondary | ICD-10-CM

## 2019-09-08 LAB — COMPREHENSIVE METABOLIC PANEL
ALT: 20 U/L (ref 0–35)
AST: 24 U/L (ref 0–37)
Albumin: 4 g/dL (ref 3.5–5.2)
Alkaline Phosphatase: 111 U/L (ref 39–117)
BUN: 13 mg/dL (ref 6–23)
CO2: 31 mEq/L (ref 19–32)
Calcium: 9.9 mg/dL (ref 8.4–10.5)
Chloride: 100 mEq/L (ref 96–112)
Creatinine, Ser: 0.8 mg/dL (ref 0.40–1.20)
GFR: 71.75 mL/min (ref >60.00)
Glucose, Bld: 95 mg/dL (ref 70–99)
Potassium: 4.1 mEq/L (ref 3.5–5.1)
Sodium: 140 mEq/L (ref 135–145)
Total Bilirubin: 0.6 mg/dL (ref 0.2–1.2)
Total Protein: 7.4 g/dL (ref 6.0–8.3)

## 2019-09-08 LAB — HEMOGLOBIN A1C: Hgb A1c MFr Bld: 6.9 % — ABNORMAL HIGH (ref 4.6–6.5)

## 2019-09-08 LAB — LIPID PANEL
Cholesterol: 117 mg/dL (ref 0–200)
HDL: 41.6 mg/dL (ref >39.00)
LDL Cholesterol: 44 mg/dL (ref 0–99)
NonHDL: 75.09
Total CHOL/HDL Ratio: 3
Triglycerides: 154 mg/dL — ABNORMAL HIGH (ref 0.0–149.0)
VLDL: 30.8 mg/dL (ref 0.0–40.0)

## 2019-09-08 LAB — TSH: TSH: 2.18 u[IU]/mL (ref 0.35–4.50)

## 2019-09-08 MED ORDER — FENOFIBRATE 160 MG PO TABS: 160.0000 mg | ORAL_TABLET | Freq: Every day | ORAL | 1 refills | Status: DC

## 2019-09-08 MED ORDER — FOLIC ACID 1 MG PO TABS: 1.0000 mg | ORAL_TABLET | Freq: Every day | ORAL | 3 refills | Status: DC

## 2019-09-09 LAB — HOMOCYSTEINE: Homocysteine: 12 umol/L — ABNORMAL HIGH (ref <10.4)

## 2019-09-09 LAB — HLA-B27 ANTIGEN: HLA-B27 Antigen: NEGATIVE

## 2019-09-29 ENCOUNTER — Telehealth: Payer: Self-pay

## 2019-09-29 NOTE — Telephone Encounter (Signed)
Copied from [place] [id number]. Topic: Complaint - Billing/Coding >> Sep 20, 2019 11:44 AM Erick Blinks wrote: Best contact: [phone] extension [phone] Almyra Free from [facility] is requesting a call back regarding a coding error.  DOS: 08/20/2019 Details of complaint: Almyra Free states that claim needs to be corrected. It was coded as non preventative  How would the patient like to see this issue resolved? It needs to be re-coded as preventative for insurance purposes   Will automatically be routed to [facility] pool. >> Sep 27, 2019  3:11 PM Yvette Rack wrote: Almyra Free with [facility] called once again regarding the coding error. Almyra Free requests call back. Cb# [phone] Ext. [phone] >> Sep 20, 2019  3:31 PM Leda Roys wrote: Durene Cal to [facility]

## 2019-09-29 NOTE — Telephone Encounter (Signed)
Please advice  

## 2019-10-03 NOTE — Telephone Encounter (Signed)
I did not see pt on 10/16--- forward to [facility]

## 2019-10-04 NOTE — Telephone Encounter (Signed)
Left message that this has been sent to our coding team.

## 2019-12-20 ENCOUNTER — Ambulatory Visit: Payer: 59 | Admitting: Family Medicine

## 2019-12-20 ENCOUNTER — Other Ambulatory Visit: Payer: Self-pay

## 2019-12-20 ENCOUNTER — Other Ambulatory Visit: Payer: Self-pay | Admitting: Family Medicine

## 2019-12-20 ENCOUNTER — Other Ambulatory Visit (INDEPENDENT_AMBULATORY_CARE_PROVIDER_SITE_OTHER): Payer: 59

## 2019-12-20 DIAGNOSIS — E785 Hyperlipidemia, unspecified: Secondary | ICD-10-CM

## 2019-12-20 DIAGNOSIS — E1169 Type 2 diabetes mellitus with other specified complication: Secondary | ICD-10-CM | POA: Diagnosis not present

## 2019-12-20 DIAGNOSIS — E1165 Type 2 diabetes mellitus with hyperglycemia: Secondary | ICD-10-CM | POA: Diagnosis not present

## 2019-12-20 DIAGNOSIS — R7989 Other specified abnormal findings of blood chemistry: Secondary | ICD-10-CM

## 2019-12-20 LAB — COMPREHENSIVE METABOLIC PANEL
ALT: 23 U/L (ref 0–35)
AST: 25 U/L (ref 0–37)
Albumin: 3.6 g/dL (ref 3.5–5.2)
Alkaline Phosphatase: 86 U/L (ref 39–117)
BUN: 14 mg/dL (ref 6–23)
CO2: 30 mEq/L (ref 19–32)
Calcium: 8.8 mg/dL (ref 8.4–10.5)
Chloride: 105 mEq/L (ref 96–112)
Creatinine, Ser: 0.88 mg/dL (ref 0.40–1.20)
GFR: 64.22 mL/min (ref >60.00)
Glucose, Bld: 141 mg/dL — ABNORMAL HIGH (ref 70–99)
Potassium: 4.5 mEq/L (ref 3.5–5.1)
Sodium: 141 mEq/L (ref 135–145)
Total Bilirubin: 0.4 mg/dL (ref 0.2–1.2)
Total Protein: 6.3 g/dL (ref 6.0–8.3)

## 2019-12-20 LAB — LIPID PANEL
Cholesterol: 102 mg/dL (ref 0–200)
HDL: 40.4 mg/dL (ref >39.00)
LDL Cholesterol: 40 mg/dL (ref 0–99)
NonHDL: 61.4
Total CHOL/HDL Ratio: 3
Triglycerides: 105 mg/dL (ref 0.0–149.0)
VLDL: 21 mg/dL (ref 0.0–40.0)

## 2019-12-20 LAB — HEMOGLOBIN A1C: Hgb A1c MFr Bld: 7.3 % — ABNORMAL HIGH (ref 4.6–6.5)

## 2019-12-22 ENCOUNTER — Other Ambulatory Visit: Payer: Self-pay | Admitting: Family Medicine

## 2019-12-22 ENCOUNTER — Telehealth: Payer: Self-pay | Admitting: Family Medicine

## 2019-12-22 DIAGNOSIS — E1165 Type 2 diabetes mellitus with hyperglycemia: Secondary | ICD-10-CM

## 2019-12-22 DIAGNOSIS — R7983 Abnormal findings of blood amino-acid level: Secondary | ICD-10-CM

## 2019-12-22 DIAGNOSIS — E1169 Type 2 diabetes mellitus with other specified complication: Secondary | ICD-10-CM

## 2019-12-22 DIAGNOSIS — E785 Hyperlipidemia, unspecified: Secondary | ICD-10-CM

## 2019-12-22 LAB — HOMOCYSTEINE: Homocysteine: 11.4 umol/L — ABNORMAL HIGH (ref <10.4)

## 2019-12-22 NOTE — Telephone Encounter (Signed)
Patient states that Nira Conn called her this morning and she is just reurning a call.   Please call patient back at her office  [phone]

## 2019-12-22 NOTE — Telephone Encounter (Signed)
Spoke with patient. Pt states she is taking the Ozempic 1 MG and Farxiga 10 MG. She is not taking Metformin. Please advice any med changes.

## 2019-12-23 NOTE — Telephone Encounter (Signed)
If she can not take metfomin ---- add amaryl 1 mg-#30  2 refills  1po qd I prefer not to use that one but a1c is too high We can refer to healthy weight and wellness or nutritionist if pt would like

## 2019-12-24 MED ORDER — GLIMEPIRIDE 1 MG PO TABS: 1.0000 mg | ORAL_TABLET | Freq: Every day | ORAL | 2 refills | Status: DC

## 2019-12-24 NOTE — Telephone Encounter (Signed)
Pt advised. New med sent in. Pt states she will think about referral.

## 2020-01-25 LAB — HM DIABETES EYE EXAM

## 2020-01-27 ENCOUNTER — Encounter: Payer: Self-pay | Admitting: Family Medicine

## 2020-01-28 NOTE — Telephone Encounter (Signed)
Documented

## 2020-02-29 ENCOUNTER — Ambulatory Visit: Payer: 59 | Admitting: Podiatry

## 2020-03-09 ENCOUNTER — Ambulatory Visit: Payer: 59 | Admitting: Family Medicine

## 2020-03-14 ENCOUNTER — Ambulatory Visit: Payer: 59 | Admitting: Podiatry

## 2020-03-16 ENCOUNTER — Other Ambulatory Visit: Payer: Self-pay | Admitting: Family Medicine

## 2020-03-17 ENCOUNTER — Telehealth: Payer: Self-pay | Admitting: Family Medicine

## 2020-03-17 ENCOUNTER — Ambulatory Visit: Payer: 59 | Admitting: Family Medicine

## 2020-03-17 ENCOUNTER — Other Ambulatory Visit: Payer: Self-pay

## 2020-03-17 ENCOUNTER — Encounter: Payer: Self-pay | Admitting: Family Medicine

## 2020-03-17 VITALS — BP 100/66 | HR 83 | Temp 97.0°F | Resp 18 | Ht 63.5 in | Wt 242.8 lb

## 2020-03-17 DIAGNOSIS — IMO0002 Reserved for concepts with insufficient information to code with codable children: Secondary | ICD-10-CM

## 2020-03-17 DIAGNOSIS — E1151 Type 2 diabetes mellitus with diabetic peripheral angiopathy without gangrene: Secondary | ICD-10-CM

## 2020-03-17 DIAGNOSIS — E785 Hyperlipidemia, unspecified: Secondary | ICD-10-CM | POA: Diagnosis not present

## 2020-03-17 DIAGNOSIS — I10 Essential (primary) hypertension: Secondary | ICD-10-CM

## 2020-03-17 DIAGNOSIS — E11621 Type 2 diabetes mellitus with foot ulcer: Secondary | ICD-10-CM

## 2020-03-17 DIAGNOSIS — E1169 Type 2 diabetes mellitus with other specified complication: Secondary | ICD-10-CM | POA: Diagnosis not present

## 2020-03-17 DIAGNOSIS — E118 Type 2 diabetes mellitus with unspecified complications: Secondary | ICD-10-CM | POA: Diagnosis not present

## 2020-03-17 DIAGNOSIS — F411 Generalized anxiety disorder: Secondary | ICD-10-CM

## 2020-03-17 DIAGNOSIS — E1165 Type 2 diabetes mellitus with hyperglycemia: Secondary | ICD-10-CM | POA: Diagnosis not present

## 2020-03-17 DIAGNOSIS — R7983 Abnormal findings of blood amino-acid level: Secondary | ICD-10-CM

## 2020-03-17 DIAGNOSIS — E662 Morbid (severe) obesity with alveolar hypoventilation: Secondary | ICD-10-CM

## 2020-03-17 DIAGNOSIS — E7211 Homocystinuria: Secondary | ICD-10-CM

## 2020-03-17 DIAGNOSIS — L97529 Non-pressure chronic ulcer of other part of left foot with unspecified severity: Secondary | ICD-10-CM

## 2020-03-17 DIAGNOSIS — R7989 Other specified abnormal findings of blood chemistry: Secondary | ICD-10-CM

## 2020-03-17 DIAGNOSIS — E1140 Type 2 diabetes mellitus with diabetic neuropathy, unspecified: Secondary | ICD-10-CM

## 2020-03-17 LAB — COMPREHENSIVE METABOLIC PANEL
ALT: 21 U/L (ref 0–35)
AST: 22 U/L (ref 0–37)
Albumin: 3.8 g/dL (ref 3.5–5.2)
Alkaline Phosphatase: 77 U/L (ref 39–117)
BUN: 16 mg/dL (ref 6–23)
CO2: 30 mEq/L (ref 19–32)
Calcium: 9 mg/dL (ref 8.4–10.5)
Chloride: 103 mEq/L (ref 96–112)
Creatinine, Ser: 0.9 mg/dL (ref 0.40–1.20)
GFR: 62.53 mL/min (ref >60.00)
Glucose, Bld: 110 mg/dL — ABNORMAL HIGH (ref 70–99)
Potassium: 4.3 mEq/L (ref 3.5–5.1)
Sodium: 140 mEq/L (ref 135–145)
Total Bilirubin: 0.6 mg/dL (ref 0.2–1.2)
Total Protein: 6.4 g/dL (ref 6.0–8.3)

## 2020-03-17 LAB — LIPID PANEL
Cholesterol: 107 mg/dL (ref 0–200)
HDL: 45.7 mg/dL (ref >39.00)
LDL Cholesterol: 43 mg/dL (ref 0–99)
NonHDL: 61.56
Total CHOL/HDL Ratio: 2
Triglycerides: 94 mg/dL (ref 0.0–149.0)
VLDL: 18.8 mg/dL (ref 0.0–40.0)

## 2020-03-17 LAB — HEMOGLOBIN A1C: Hgb A1c MFr Bld: 6.5 % (ref 4.6–6.5)

## 2020-03-17 MED ORDER — ATORVASTATIN CALCIUM 10 MG PO TABS: 10.0000 mg | ORAL_TABLET | Freq: Every day | ORAL | 1 refills | Status: DC

## 2020-03-17 MED ORDER — FARXIGA 10 MG PO TABS: 10.0000 mg | ORAL_TABLET | Freq: Every day | ORAL | 1 refills | Status: DC

## 2020-03-17 MED ORDER — OZEMPIC (1 MG/DOSE) 2 MG/1.5ML ~~LOC~~ SOPN: 1.0000 mg | PEN_INJECTOR | Freq: Every day | SUBCUTANEOUS | 1 refills | Status: DC

## 2020-03-17 MED ORDER — FOLIC ACID 1 MG PO TABS: 1.0000 mg | ORAL_TABLET | Freq: Every day | ORAL | 3 refills | Status: DC

## 2020-03-17 MED ORDER — ESCITALOPRAM OXALATE 20 MG PO TABS: 20.0000 mg | ORAL_TABLET | Freq: Every day | ORAL | 1 refills | Status: DC

## 2020-03-17 MED ORDER — FENOFIBRATE 160 MG PO TABS: 160.0000 mg | ORAL_TABLET | Freq: Every day | ORAL | 1 refills | Status: DC

## 2020-03-17 NOTE — Progress Notes (Signed)
Patient ID: Jacqueline Duran, female    DOB: [birth year]  Age: 69 y.o. MRN: [id number]    Subjective:  Subjective  HPI Jacqueline Duran presents for f/u dm, chol and bp.   HYPERTENSION   Blood pressure range-not checking   Chest pain- no      Dyspnea- no Lightheadedness- no   Edema- no  Other side effects - no   Medication compliance: good Low salt diet- yes    DIABETES    Blood Sugar ranges-not checking   Polyuria- no New Visual problems- no  Hypoglycemic symptoms- no  Other side effects-no Medication compliance - good Last eye exam- due Foot exam- podiatrist    HYPERLIPIDEMIA  Medication compliance- good RUQ pain- no  Muscle aches- no Other side effects-no   ROS See HPI above   PMH Smoking Status noted      Review of Systems  Constitutional: Negative for appetite change, diaphoresis, fatigue and unexpected weight change.  Eyes: Negative for pain, redness and visual disturbance.  Respiratory: Negative for cough, chest tightness, shortness of breath and wheezing.   Cardiovascular: Negative for chest pain, palpitations and leg swelling.  Endocrine: Negative for cold intolerance, heat intolerance, polydipsia, polyphagia and polyuria.  Genitourinary: Negative for difficulty urinating, dysuria and frequency.  Neurological: Negative for dizziness, light-headedness, numbness and headaches.    History Past Medical History:  Diagnosis Date  . Colon polyps   . Deafness    Right Ear  . Diabetes mellitus   . Hyperlipidemia   . Hypertension     She has a past surgical history that includes Abdominal hysterectomy; Tonsillectomy; Cervical fusion (07/2004); and Colonoscopy (8/11).   Her family history includes Alcohol abuse in some other family members; Cancer in her father; Clotting disorder in her sister; Coronary artery disease in her mother; Factor V Leiden deficiency in an other family member; Heart attack in an other family member; Heart disease in an other family  member; Leukemia in her maternal uncle; Lung cancer in her father; Stroke in an other family member.She reports that she has never smoked. She has never used smokeless tobacco. She reports that she does not drink alcohol or use drugs.  Current Outpatient Medications on File Prior to Visit  Medication Sig Dispense Refill  . ascorbic acid (VITAMIN C) 500 MG tablet Take 1,000 mg by mouth daily.     Marland Kitchen aspirin 81 MG tablet Take 81 mg by mouth daily.      Marland Kitchen glimepiride (AMARYL) 1 MG tablet TAKE 1 TABLET BY MOUTH EVERY DAY 90 tablet 2  . glucose blood (ONE TOUCH ULTRA TEST) test strip Check blood sugar once daily. Dx:E11.9 100 each 5  . Insulin Pen Needle (BD PEN NEEDLE MICRO U/F) 32G X 6 MM MISC To use w/ Ozempic 100 each 3  . Magnesium 500 MG TABS Take 1 tablet by mouth daily.    . Multiple Vitamin (MULTIVITAMIN) tablet Take 1 tablet by mouth daily.      [name] DELICA LANCETS FINE MISC Check blood sugar once daily. Dx:E11.9 100 each 5  . valsartan-hydrochlorothiazide (DIOVAN-HCT) 160-12.5 MG tablet Take 1 tablet by mouth daily. 90 tablet 1   No current facility-administered medications on file prior to visit.     Objective:  Objective  Physical Exam Vitals and nursing note reviewed.  Constitutional:      Appearance: She is well-developed.  HENT:     Head: Normocephalic and atraumatic.  Eyes:     Conjunctiva/sclera: Conjunctivae normal.  Neck:  Thyroid: No thyromegaly.     Vascular: No carotid bruit or JVD.  Cardiovascular:     Rate and Rhythm: Normal rate and regular rhythm.     Heart sounds: Normal heart sounds. No murmur.  Pulmonary:     Effort: Pulmonary effort is normal. No respiratory distress.     Breath sounds: Normal breath sounds. No wheezing or rales.  Chest:     Chest wall: No tenderness.  Musculoskeletal:     Cervical back: Normal range of motion and neck supple.  Neurological:     Mental Status: She is alert and oriented to person, place, and time.     Diabetic Foot Exam - Simple   Simple Foot Form Diabetic Foot exam was performed with the following findings: Yes 03/17/2020 11:19 AM  Visual Inspection No deformities, no ulcerations, no other skin breakdown bilaterally: Yes Sensation Testing See comments: Yes Pulse Check Posterior Tibialis and Dorsalis pulse intact bilaterally: Yes Comments Pt unable to feel bottom of her feet  Good sensation top , lat and medially     BP 100/66 (BP Location: Right Arm, Patient Position: Sitting, Cuff Size: Large)   Pulse 83   Temp (!) 97 F (36.1 C) (Temporal)   Resp 18   Ht 5' 3.5" (1.613 m)   Wt 242 lb 12.8 oz (110.1 kg)   SpO2 96%   BMI 42.34 kg/m  Wt Readings from Last 3 Encounters:  03/17/20 242 lb 12.8 oz (110.1 kg)  09/07/19 240 lb 9.6 oz (109.1 kg)  01/08/19 247 lb (112 kg)     Lab Results  Component Value Date   WBC 10.9 (H) 05/02/2017   HGB 13.6 05/02/2017   HCT 41.2 05/02/2017   PLT 176.0 05/02/2017   GLUCOSE 110 (H) 03/17/2020   CHOL 107 03/17/2020   TRIG 94.0 03/17/2020   HDL 45.70 03/17/2020   LDLCALC 43 03/17/2020   ALT 21 03/17/2020   AST 22 03/17/2020   NA 140 03/17/2020   K 4.3 03/17/2020   CL 103 03/17/2020   CREATININE 0.90 03/17/2020   BUN 16 03/17/2020   CO2 30 03/17/2020   TSH 2.18 09/07/2019   HGBA1C 6.5 03/17/2020   MICROALBUR <0.7 12/26/2017    DG Bone Density  Result Date: 08/20/2019 EXAM: DUAL X-RAY ABSORPTIOMETRY (DXA) FOR BONE MINERAL DENSITY IMPRESSION: Jacqueline Duran Your patient Jacqueline Duran completed a BMD test on 08/20/2019 using the [place] (analysis version: 16.SP2) manufactured by [employer]. The following summarizes the results of our evaluation. CAK PATIENT: Name: Jacqueline Duran Patient ID: [id number] Birth Date: [birth year] Height: 64.0 in. Gender: Female Measured: 08/20/2019 Weight: 243.4 lbs. Indications: Caucasian, Diabetic, Estrogen Deficiency, Hysterectomy, Post Menopausal Fractures: Treatments:  Caltrate 600 + D, Farxiga ASSESSMENT: The BMD measured at Forearm Radius 33% is 0.828 g/cm2 with a T-score of -0.5. This patient is considered normal according to [facility]) criteria. Lumbar spine was not utilized due to advanced degenerative changes. Scan quality was good. Site Region Measured Date Measured Age WHO YA BMD Classification T-score DualFemur Neck Left 08/20/2019 65.9 years Normal 0.5 1.109 g/cm2 Left Forearm Radius 33% 08/20/2019 65.9 Normal -0.5 0.828 g/cm2 World Health Organization [facility]) criteria for post-menopausal, Caucasian Women: Normal       T-score at or above -1 SD Osteopenia   T-score between -1 and -2.5 SD Osteoporosis T-score at or below -2.5 SD RECOMMENDATION: 1. All patients should optimize calcium and vitamin D intake. 2. Consider FDA-approved medical therapies in  postmenopausal women and men aged 44 years and older, based on the following: a. A hip or vertebral(clinical or morphometric) fracture. b. T-Score < -2.5 at the femoral neck or spine after appropriate evaluation to exclude secondary causes c. Low bone mass (T-score between -1.0 and -2.5 at the femoral neck or spine) and a 10 year probability of a hip fracture >3% or a 10 year probability of major osteoporosis-related fracture > 20% based on the [place]-adapted WHO algorithm d. Clinical judgement and/or patient preferences may indicate treatment for people with 10-year fracture probabilities above or below these levels FOLLOW-UP: Patients with diagnosis of osteoporosis or at high risk for fracture should have regular bone mineral density tests. For patients eligible for Medicare, routine testing is allowed once every 2 years. The testing frequency can be increased to one year for patients who have rapidly progressing disease, those who are receiving or discontinuing medical therapy to restore bone mass, or have additional risk factors. I have reviewed this report and agree with the above findings. [facility]  Radiology Electronically Signed   By: Lowella Grip III M.D.   On: 08/20/2019 17:26     Assessment & Plan:  Plan  I have discontinued Ashley M. Coats "Opie Riemann"'s gabapentin. I am also having her maintain her ascorbic acid, aspirin, multivitamin, Magnesium, OneTouch Delica Lancets Fine, glucose blood, Insulin Pen Needle, valsartan-hydrochlorothiazide, glimepiride, Farxiga, Ozempic (1 MG/DOSE), atorvastatin, escitalopram, fenofibrate, and folic acid.  Meds ordered this encounter  Medications  . dapagliflozin propanediol (FARXIGA) 10 MG TABS tablet    Sig: Take 10 mg by mouth daily.    Dispense:  90 tablet    Refill:  1  . Semaglutide, 1 MG/DOSE, (OZEMPIC, 1 MG/DOSE,) 2 MG/1.5ML SOPN    Sig: Inject 1 mg into the skin daily.    Dispense:  4 pen    Refill:  1  . atorvastatin (LIPITOR) 10 MG tablet    Sig: Take 1 tablet (10 mg total) by mouth daily.    Dispense:  90 tablet    Refill:  1  . escitalopram (LEXAPRO) 20 MG tablet    Sig: Take 1 tablet (20 mg total) by mouth daily.    Dispense:  90 tablet    Refill:  1  . fenofibrate 160 MG tablet    Sig: Take 1 tablet (160 mg total) by mouth daily.    Dispense:  90 tablet    Refill:  1  . folic acid (FOLVITE) 1 MG tablet    Sig: Take 1 tablet (1 mg total) by mouth daily.    Dispense:  90 tablet    Refill:  3    Problem List Items Addressed This Visit      Unprioritized   DM (diabetes mellitus) type II uncontrolled, periph vascular disorder ([place])    hgba1c to be checked, minimize simple carbs. Increase exercise as tolerated. Continue current meds       Relevant Medications   dapagliflozin propanediol (FARXIGA) 10 MG TABS tablet   Semaglutide, 1 MG/DOSE, (OZEMPIC, 1 MG/DOSE,) 2 MG/1.5ML SOPN   atorvastatin (LIPITOR) 10 MG tablet   fenofibrate 160 MG tablet   Essential hypertension - Primary    Well controlled, no changes to meds. Encouraged heart healthy diet such as the DASH diet and exercise as tolerated.        Relevant Medications   atorvastatin (LIPITOR) 10 MG tablet   fenofibrate 160 MG tablet   Other Relevant Orders   Lipid panel (Completed)   Hemoglobin  A1c (Completed)   Comprehensive metabolic panel (Completed)   Comprehensive metabolic panel   Hyperlipidemia associated with type 2 diabetes mellitus ([place])    Encouraged heart healthy diet, increase exercise, avoid trans fats, consider a krill oil cap daily      Relevant Medications   dapagliflozin propanediol (FARXIGA) 10 MG TABS tablet   Semaglutide, 1 MG/DOSE, (OZEMPIC, 1 MG/DOSE,) 2 MG/1.5ML SOPN   atorvastatin (LIPITOR) 10 MG tablet   fenofibrate 160 MG tablet   Other Relevant Orders   Lipid panel (Completed)   Hemoglobin A1c (Completed)   Comprehensive metabolic panel (Completed)   Lipid panel   Comprehensive metabolic panel   OBESITY   Relevant Medications   dapagliflozin propanediol (FARXIGA) 10 MG TABS tablet   Semaglutide, 1 MG/DOSE, (OZEMPIC, 1 MG/DOSE,) 2 MG/1.5ML SOPN    Other Visit Diagnoses    Type 2 diabetes mellitus with hyperglycemia, without long-term current use of insulin (HCC)       Relevant Medications   dapagliflozin propanediol (FARXIGA) 10 MG TABS tablet   Semaglutide, 1 MG/DOSE, (OZEMPIC, 1 MG/DOSE,) 2 MG/1.5ML SOPN   atorvastatin (LIPITOR) 10 MG tablet   Other Relevant Orders   Lipid panel (Completed)   Hemoglobin A1c (Completed)   Comprehensive metabolic panel (Completed)   Uncontrolled type 2 diabetes mellitus with complication, without long-term current use of insulin (HCC)       Relevant Medications   dapagliflozin propanediol (FARXIGA) 10 MG TABS tablet   Semaglutide, 1 MG/DOSE, (OZEMPIC, 1 MG/DOSE,) 2 MG/1.5ML SOPN   atorvastatin (LIPITOR) 10 MG tablet   Type 2 diabetes mellitus with diabetic neuropathy, without long-term current use of insulin (HCC)       Relevant Medications   dapagliflozin propanediol (FARXIGA) 10 MG TABS tablet   Semaglutide, 1 MG/DOSE, (OZEMPIC, 1 MG/DOSE,) 2 MG/1.5ML  SOPN   atorvastatin (LIPITOR) 10 MG tablet   Other Relevant Orders   Hemoglobin A1c   Comprehensive metabolic panel   Hyperlipidemia, unspecified hyperlipidemia type       Relevant Medications   atorvastatin (LIPITOR) 10 MG tablet   fenofibrate 160 MG tablet   Other Relevant Orders   Lipid panel   Comprehensive metabolic panel   Hyperlipidemia LDL goal <70       Relevant Medications   atorvastatin (LIPITOR) 10 MG tablet   fenofibrate 160 MG tablet   Other Relevant Orders   Lipid panel   Comprehensive metabolic panel   Generalized anxiety disorder       Relevant Medications   escitalopram (LEXAPRO) 20 MG tablet   Diabetic ulcer of left foot associated with type 2 diabetes mellitus, unspecified part of foot, unspecified ulcer stage (HCC)   (Chronic)     Relevant Medications   dapagliflozin propanediol (FARXIGA) 10 MG TABS tablet   Semaglutide, 1 MG/DOSE, (OZEMPIC, 1 MG/DOSE,) 2 MG/1.5ML SOPN   atorvastatin (LIPITOR) 10 MG tablet   Homocysteinemia (HCC)   (Chronic)     Relevant Orders   Homocysteine   Elevated homocysteine       Relevant Medications   folic acid (FOLVITE) 1 MG tablet   Other Relevant Orders   Homocysteine      Follow-up: Return in about 6 months (around 09/17/2020), or if symptoms worsen or fail to improve, for fasting, annual exam.  [name], DO

## 2020-03-17 NOTE — Patient Instructions (Signed)
Carbohydrate Counting for Diabetes Mellitus, Adult  Carbohydrate counting is a method of keeping track of how many carbohydrates you eat. Eating carbohydrates naturally increases the amount of sugar (glucose) in the blood. Counting how many carbohydrates you eat helps keep your blood glucose within normal limits, which helps you manage your diabetes (diabetes mellitus). It is important to know how many carbohydrates you can safely have in each meal. This is different for every person. A diet and nutrition specialist (registered dietitian) can help you make a meal plan and calculate how many carbohydrates you should have at each meal and snack. Carbohydrates are found in the following foods:  Grains, such as breads and cereals.  Dried beans and soy products.  Starchy vegetables, such as potatoes, peas, and corn.  Fruit and fruit juices.  Milk and yogurt.  Sweets and snack foods, such as cake, cookies, candy, chips, and soft drinks. How do I count carbohydrates? There are two ways to count carbohydrates in food. You can use either of the methods or a combination of both. Reading "Nutrition Facts" on packaged food The "Nutrition Facts" list is included on the labels of almost all packaged foods and beverages in the [place] It includes:  The serving size.  Information about nutrients in each serving, including the grams (g) of carbohydrate per serving. To use the "Nutrition Facts":  Decide how many servings you will have.  Multiply the number of servings by the number of carbohydrates per serving.  The resulting number is the total amount of carbohydrates that you will be having. Learning standard serving sizes of other foods When you eat carbohydrate foods that are not packaged or do not include "Nutrition Facts" on the label, you need to measure the servings in order to count the amount of carbohydrates:  Measure the foods that you will eat with a food scale or measuring cup, if  needed.  Decide how many standard-size servings you will eat.  Multiply the number of servings by 15. Most carbohydrate-rich foods have about 15 g of carbohydrates per serving. ? For example, if you eat 8 oz (170 g) of strawberries, you will have eaten 2 servings and 30 g of carbohydrates (2 servings x 15 g = 30 g).  For foods that have more than one food mixed, such as soups and casseroles, you must count the carbohydrates in each food that is included. The following list contains standard serving sizes of common carbohydrate-rich foods. Each of these servings has about 15 g of carbohydrates:   hamburger bun or  English muffin.   oz (15 mL) syrup.   oz (14 g) jelly.  1 slice of bread.  1 six-inch tortilla.  3 oz (85 g) cooked rice or pasta.  4 oz (113 g) cooked dried beans.  4 oz (113 g) starchy vegetable, such as peas, corn, or potatoes.  4 oz (113 g) hot cereal.  4 oz (113 g) mashed potatoes or  of a large baked potato.  4 oz (113 g) canned or frozen fruit.  4 oz (120 mL) fruit juice.  4-6 crackers.  6 chicken nuggets.  6 oz (170 g) unsweetened dry cereal.  6 oz (170 g) plain fat-free yogurt or yogurt sweetened with artificial sweeteners.  8 oz (240 mL) milk.  8 oz (170 g) fresh fruit or one small piece of fruit.  24 oz (680 g) popped popcorn. Example of carbohydrate counting Sample meal  3 oz (85 g) chicken breast.  6 oz (170 g)   brown rice.  4 oz (113 g) corn.  8 oz (240 mL) milk.  8 oz (170 g) strawberries with sugar-free whipped topping. Carbohydrate calculation 1. Identify the foods that contain carbohydrates: ? Rice. ? Corn. ? Milk. ? Strawberries. 2. Calculate how many servings you have of each food: ? 2 servings rice. ? 1 serving corn. ? 1 serving milk. ? 1 serving strawberries. 3. Multiply each number of servings by 15 g: ? 2 servings rice x 15 g = 30 g. ? 1 serving corn x 15 g = 15 g. ? 1 serving milk x 15 g = 15 g. ? 1  serving strawberries x 15 g = 15 g. 4. Add together all of the amounts to find the total grams of carbohydrates eaten: ? 30 g + 15 g + 15 g + 15 g = 75 g of carbohydrates total. Summary  Carbohydrate counting is a method of keeping track of how many carbohydrates you eat.  Eating carbohydrates naturally increases the amount of sugar (glucose) in the blood.  Counting how many carbohydrates you eat helps keep your blood glucose within normal limits, which helps you manage your diabetes.  A diet and nutrition specialist (registered dietitian) can help you make a meal plan and calculate how many carbohydrates you should have at each meal and snack. This information is not intended to replace advice given to you by your health care provider. Make sure you discuss any questions you have with your health care provider. Document Revised: 05/15/2017 Document Reviewed: 04/03/2016 Elsevier Patient Education  2020 Elsevier Inc.  

## 2020-03-19 NOTE — Assessment & Plan Note (Signed)
Well controlled, no changes to meds. Encouraged heart healthy diet such as the DASH diet and exercise as tolerated.  °

## 2020-03-19 NOTE — Assessment & Plan Note (Signed)
hgba1c to be checked, minimize simple carbs. Increase exercise as tolerated. Continue current meds  

## 2020-03-19 NOTE — Assessment & Plan Note (Signed)
Encouraged heart healthy diet, increase exercise, avoid trans fats, consider a krill oil cap daily 

## 2020-03-20 ENCOUNTER — Encounter: Payer: Self-pay | Admitting: Family Medicine

## 2020-03-20 DIAGNOSIS — E1140 Type 2 diabetes mellitus with diabetic neuropathy, unspecified: Secondary | ICD-10-CM

## 2020-03-20 LAB — HOMOCYSTEINE: Homocysteine: 14.9 umol/L — ABNORMAL HIGH (ref <10.4)

## 2020-03-21 MED ORDER — OZEMPIC (1 MG/DOSE) 2 MG/1.5ML ~~LOC~~ SOPN: 1.0000 mg | PEN_INJECTOR | SUBCUTANEOUS | 1 refills | Status: DC

## 2020-06-03 ENCOUNTER — Other Ambulatory Visit: Payer: Self-pay | Admitting: Family Medicine

## 2020-06-03 DIAGNOSIS — I10 Essential (primary) hypertension: Secondary | ICD-10-CM

## 2020-06-06 ENCOUNTER — Other Ambulatory Visit: Payer: Self-pay | Admitting: Family Medicine

## 2020-06-06 DIAGNOSIS — E1140 Type 2 diabetes mellitus with diabetic neuropathy, unspecified: Secondary | ICD-10-CM

## 2020-07-28 ENCOUNTER — Encounter: Payer: Self-pay | Admitting: Family Medicine

## 2020-07-28 LAB — HM MAMMOGRAPHY

## 2020-07-28 NOTE — Telephone Encounter (Signed)
Mammogram has been abstracted and placed to scan.

## 2020-08-17 ENCOUNTER — Encounter: Payer: Self-pay | Admitting: Family Medicine

## 2020-08-17 NOTE — Telephone Encounter (Signed)
Noted  

## 2020-09-29 ENCOUNTER — Other Ambulatory Visit: Payer: Self-pay | Admitting: Family Medicine

## 2020-09-29 DIAGNOSIS — E1165 Type 2 diabetes mellitus with hyperglycemia: Secondary | ICD-10-CM

## 2020-09-29 DIAGNOSIS — IMO0002 Reserved for concepts with insufficient information to code with codable children: Secondary | ICD-10-CM

## 2020-09-29 DIAGNOSIS — E785 Hyperlipidemia, unspecified: Secondary | ICD-10-CM

## 2020-09-29 DIAGNOSIS — E1169 Type 2 diabetes mellitus with other specified complication: Secondary | ICD-10-CM

## 2020-09-29 DIAGNOSIS — E1151 Type 2 diabetes mellitus with diabetic peripheral angiopathy without gangrene: Secondary | ICD-10-CM

## 2020-10-18 ENCOUNTER — Other Ambulatory Visit: Payer: Self-pay | Admitting: Family Medicine

## 2020-10-18 DIAGNOSIS — I10 Essential (primary) hypertension: Secondary | ICD-10-CM

## 2020-11-24 ENCOUNTER — Encounter: Payer: 59 | Admitting: Gastroenterology

## 2020-12-10 ENCOUNTER — Other Ambulatory Visit: Payer: Self-pay | Admitting: Family Medicine

## 2020-12-10 DIAGNOSIS — I10 Essential (primary) hypertension: Secondary | ICD-10-CM

## 2020-12-24 ENCOUNTER — Other Ambulatory Visit: Payer: Self-pay | Admitting: Family Medicine

## 2020-12-24 DIAGNOSIS — E785 Hyperlipidemia, unspecified: Secondary | ICD-10-CM

## 2021-01-11 LAB — HM DIABETES EYE EXAM

## 2021-01-12 ENCOUNTER — Encounter: Payer: Self-pay | Admitting: Family Medicine

## 2021-01-14 ENCOUNTER — Other Ambulatory Visit: Payer: Self-pay | Admitting: Family Medicine

## 2021-01-14 DIAGNOSIS — F411 Generalized anxiety disorder: Secondary | ICD-10-CM

## 2021-02-09 ENCOUNTER — Other Ambulatory Visit: Payer: Self-pay | Admitting: Family Medicine

## 2021-02-09 DIAGNOSIS — I10 Essential (primary) hypertension: Secondary | ICD-10-CM

## 2021-03-06 ENCOUNTER — Other Ambulatory Visit: Payer: Self-pay | Admitting: Family Medicine

## 2021-03-06 DIAGNOSIS — I10 Essential (primary) hypertension: Secondary | ICD-10-CM

## 2021-03-09 ENCOUNTER — Other Ambulatory Visit: Payer: Self-pay | Admitting: Family Medicine

## 2021-03-09 DIAGNOSIS — I10 Essential (primary) hypertension: Secondary | ICD-10-CM

## 2021-03-24 ENCOUNTER — Other Ambulatory Visit: Payer: Self-pay | Admitting: Family Medicine

## 2021-03-24 DIAGNOSIS — R7989 Other specified abnormal findings of blood chemistry: Secondary | ICD-10-CM

## 2021-04-14 ENCOUNTER — Other Ambulatory Visit: Payer: Self-pay | Admitting: Family Medicine

## 2021-04-15 ENCOUNTER — Other Ambulatory Visit: Payer: Self-pay | Admitting: Family Medicine

## 2021-04-15 DIAGNOSIS — F411 Generalized anxiety disorder: Secondary | ICD-10-CM

## 2021-04-19 ENCOUNTER — Other Ambulatory Visit: Payer: Self-pay | Admitting: Family Medicine

## 2021-04-19 ENCOUNTER — Other Ambulatory Visit: Payer: Self-pay

## 2021-04-19 DIAGNOSIS — IMO0002 Reserved for concepts with insufficient information to code with codable children: Secondary | ICD-10-CM

## 2021-04-19 DIAGNOSIS — E785 Hyperlipidemia, unspecified: Secondary | ICD-10-CM

## 2021-04-20 ENCOUNTER — Other Ambulatory Visit: Payer: Self-pay

## 2021-04-20 ENCOUNTER — Encounter: Payer: Self-pay | Admitting: Family Medicine

## 2021-04-20 ENCOUNTER — Ambulatory Visit (INDEPENDENT_AMBULATORY_CARE_PROVIDER_SITE_OTHER): Payer: 59 | Admitting: Family Medicine

## 2021-04-20 VITALS — BP 120/70 | HR 70 | Temp 99.7°F | Resp 18 | Ht 64.0 in | Wt 246.8 lb

## 2021-04-20 DIAGNOSIS — E785 Hyperlipidemia, unspecified: Secondary | ICD-10-CM | POA: Diagnosis not present

## 2021-04-20 DIAGNOSIS — I10 Essential (primary) hypertension: Secondary | ICD-10-CM | POA: Diagnosis not present

## 2021-04-20 DIAGNOSIS — Z0000 Encounter for general adult medical examination without abnormal findings: Secondary | ICD-10-CM

## 2021-04-20 DIAGNOSIS — E118 Type 2 diabetes mellitus with unspecified complications: Secondary | ICD-10-CM | POA: Diagnosis not present

## 2021-04-20 DIAGNOSIS — E7211 Homocystinuria: Secondary | ICD-10-CM | POA: Diagnosis not present

## 2021-04-20 DIAGNOSIS — E11621 Type 2 diabetes mellitus with foot ulcer: Secondary | ICD-10-CM

## 2021-04-20 DIAGNOSIS — E1169 Type 2 diabetes mellitus with other specified complication: Secondary | ICD-10-CM

## 2021-04-20 DIAGNOSIS — R7989 Other specified abnormal findings of blood chemistry: Secondary | ICD-10-CM | POA: Diagnosis not present

## 2021-04-20 DIAGNOSIS — Z1211 Encounter for screening for malignant neoplasm of colon: Secondary | ICD-10-CM | POA: Diagnosis not present

## 2021-04-20 DIAGNOSIS — F411 Generalized anxiety disorder: Secondary | ICD-10-CM

## 2021-04-20 DIAGNOSIS — IMO0002 Reserved for concepts with insufficient information to code with codable children: Secondary | ICD-10-CM

## 2021-04-20 DIAGNOSIS — E662 Morbid (severe) obesity with alveolar hypoventilation: Secondary | ICD-10-CM

## 2021-04-20 DIAGNOSIS — E1140 Type 2 diabetes mellitus with diabetic neuropathy, unspecified: Secondary | ICD-10-CM

## 2021-04-20 DIAGNOSIS — E1151 Type 2 diabetes mellitus with diabetic peripheral angiopathy without gangrene: Secondary | ICD-10-CM

## 2021-04-20 DIAGNOSIS — E1165 Type 2 diabetes mellitus with hyperglycemia: Secondary | ICD-10-CM

## 2021-04-20 DIAGNOSIS — E7210 Disorders of sulfur-bearing amino-acid metabolism, unspecified: Secondary | ICD-10-CM

## 2021-04-20 DIAGNOSIS — L97529 Non-pressure chronic ulcer of other part of left foot with unspecified severity: Secondary | ICD-10-CM

## 2021-04-20 LAB — CBC WITH DIFFERENTIAL/PLATELET
Basophils Absolute: 0 10*3/uL (ref 0.0–0.1)
Basophils Relative: 0.5 % (ref 0.0–3.0)
Eosinophils Absolute: 0.2 10*3/uL (ref 0.0–0.7)
Eosinophils Relative: 2.6 % (ref 0.0–5.0)
HCT: 41.8 % (ref 36.0–46.0)
Hemoglobin: 13.9 g/dL (ref 12.0–15.0)
Lymphocytes Relative: 24.2 % (ref 12.0–46.0)
Lymphs Abs: 2 10*3/uL (ref 0.7–4.0)
MCHC: 33.3 g/dL (ref 30.0–36.0)
MCV: 92.8 fl (ref 78.0–100.0)
Monocytes Absolute: 0.6 10*3/uL (ref 0.1–1.0)
Monocytes Relative: 6.6 % (ref 3.0–12.0)
Neutro Abs: 5.6 10*3/uL (ref 1.4–7.7)
Neutrophils Relative %: 66.1 % (ref 43.0–77.0)
Platelets: 145 10*3/uL — ABNORMAL LOW (ref 150.0–400.0)
RBC: 4.51 Mil/uL (ref 3.87–5.11)
RDW: 13.6 % (ref 11.5–15.5)
WBC: 8.5 10*3/uL (ref 4.0–10.5)

## 2021-04-20 LAB — COMPREHENSIVE METABOLIC PANEL
ALT: 17 U/L (ref 0–35)
AST: 17 U/L (ref 0–37)
Albumin: 4 g/dL (ref 3.5–5.2)
Alkaline Phosphatase: 86 U/L (ref 39–117)
BUN: 21 mg/dL (ref 6–23)
CO2: 29 mEq/L (ref 19–32)
Calcium: 9.6 mg/dL (ref 8.4–10.5)
Chloride: 106 mEq/L (ref 96–112)
Creatinine, Ser: 0.93 mg/dL (ref 0.40–1.20)
GFR: 63.53 mL/min (ref >60.00)
Glucose, Bld: 104 mg/dL — ABNORMAL HIGH (ref 70–99)
Potassium: 4.3 mEq/L (ref 3.5–5.1)
Sodium: 143 mEq/L (ref 135–145)
Total Bilirubin: 0.4 mg/dL (ref 0.2–1.2)
Total Protein: 6.7 g/dL (ref 6.0–8.3)

## 2021-04-20 LAB — LIPID PANEL
Cholesterol: 114 mg/dL (ref 0–200)
HDL: 48.8 mg/dL (ref >39.00)
LDL Cholesterol: 44 mg/dL (ref 0–99)
NonHDL: 65.08
Total CHOL/HDL Ratio: 2
Triglycerides: 106 mg/dL (ref 0.0–149.0)
VLDL: 21.2 mg/dL (ref 0.0–40.0)

## 2021-04-20 LAB — MICROALBUMIN / CREATININE URINE RATIO
Creatinine,U: 114.4 mg/dL
Microalb Creat Ratio: 0.6 mg/g (ref 0.0–30.0)
Microalb, Ur: <0.7 mg/dL (ref 0.0–1.9)

## 2021-04-20 LAB — HEMOGLOBIN A1C: Hgb A1c MFr Bld: 6.3 % (ref 4.6–6.5)

## 2021-04-20 LAB — TSH: TSH: 1.97 u[IU]/mL (ref 0.35–4.50)

## 2021-04-20 MED ORDER — VALSARTAN-HYDROCHLOROTHIAZIDE 160-12.5 MG PO TABS: 1.0000 | ORAL_TABLET | Freq: Every day | ORAL | 1 refills | Status: DC

## 2021-04-20 MED ORDER — FENOFIBRATE 160 MG PO TABS: 160.0000 mg | ORAL_TABLET | Freq: Every day | ORAL | 1 refills | Status: DC

## 2021-04-20 MED ORDER — GLIMEPIRIDE 1 MG PO TABS: 1.0000 mg | ORAL_TABLET | Freq: Every day | ORAL | 1 refills | Status: DC

## 2021-04-20 MED ORDER — FOLIC ACID 1 MG PO TABS: 1.0000 mg | ORAL_TABLET | Freq: Every day | ORAL | 3 refills | Status: DC

## 2021-04-20 MED ORDER — ESCITALOPRAM OXALATE 20 MG PO TABS: 1.0000 | ORAL_TABLET | Freq: Every day | ORAL | 3 refills | Status: DC

## 2021-04-20 MED ORDER — OZEMPIC (1 MG/DOSE) 2 MG/1.5ML ~~LOC~~ SOPN: 1.0000 mg | PEN_INJECTOR | SUBCUTANEOUS | 3 refills | Status: DC

## 2021-04-20 MED ORDER — ATORVASTATIN CALCIUM 10 MG PO TABS: 1.0000 | ORAL_TABLET | Freq: Every day | ORAL | 1 refills | Status: DC

## 2021-04-20 MED ORDER — DAPAGLIFLOZIN PROPANEDIOL 10 MG PO TABS: 10.0000 mg | ORAL_TABLET | Freq: Every day | ORAL | 1 refills | Status: DC

## 2021-04-20 NOTE — Patient Instructions (Signed)
Preventive Care 68 Years and Older, Female Preventive care refers to lifestyle choices and visits with your health care provider that can promote health and wellness. This includes: A yearly physical exam. This is also called an annual wellness visit. Regular dental and eye exams. Immunizations. Screening for certain conditions. Healthy lifestyle choices, such as: Eating a healthy diet. Getting regular exercise. Not using drugs or products that contain nicotine and tobacco. Limiting alcohol use. What can I expect for my preventive care visit? Physical exam Your health care provider will check your: Height and weight. These may be used to calculate your BMI (body mass index). BMI is a measurement that tells if you are at a healthy weight. Heart rate and blood pressure. Body temperature. Skin for abnormal spots. Counseling Your health care provider may ask you questions about your: Past medical problems. Family's medical history. Alcohol, tobacco, and drug use. Emotional well-being. Home life and relationship well-being. Sexual activity. Diet, exercise, and sleep habits. History of falls. Memory and ability to understand (cognition). Work and work Statistician. Pregnancy and menstrual history. Access to firearms. What immunizations do I need?  Vaccines are usually given at various ages, according to a schedule. Your health care provider will recommend vaccines for you based on your age, medicalhistory, and lifestyle or other factors, such as travel or where you work. What tests do I need? Blood tests Lipid and cholesterol levels. These may be checked every 5 years, or more often depending on your overall health. Hepatitis C test. Hepatitis B test. Screening Lung cancer screening. You may have this screening every year starting at age 68 if you have a 30-pack-year history of smoking and currently smoke or have quit within the past 15 years. Colorectal cancer screening. All  adults should have this screening starting at age 68 and continuing until age 65. Your health care provider may recommend screening at age 61 if you are at increased risk. You will have tests every 1-10 years, depending on your results and the type of screening test. Diabetes screening. This is done by checking your blood sugar (glucose) after you have not eaten for a while (fasting). You may have this done every 1-3 years. Mammogram. This may be done every 1-2 years. Talk with your health care provider about how often you should have regular mammograms. Abdominal aortic aneurysm (AAA) screening. You may need this if you are a current or former smoker. BRCA-related cancer screening. This may be done if you have a family history of breast, ovarian, tubal, or peritoneal cancers. Other tests STD (sexually transmitted disease) testing, if you are at risk. Bone density scan. This is done to screen for osteoporosis. You may have this done starting at age 68. Talk with your health care provider about your test results, treatment options,and if necessary, the need for more tests. Follow these instructions at home: Eating and drinking  Eat a diet that includes fresh fruits and vegetables, whole grains, lean protein, and low-fat dairy products. Limit your intake of foods with high amounts of sugar, saturated fats, and salt. Take vitamin and mineral supplements as recommended by your health care provider. Do not drink alcohol if your health care provider tells you not to drink. If you drink alcohol: Limit how much you have to 0-1 drink a day. Be aware of how much alcohol is in your drink. In the [place] one drink equals one 12 oz bottle of beer (355 mL), one 5 oz glass of wine (148 mL), or one 1  oz glass of hard liquor (44 mL).  Lifestyle Take daily care of your teeth and gums. Brush your teeth every morning and night with fluoride toothpaste. Floss one time each day. Stay active. Exercise for at  least 30 minutes 5 or more days each week. Do not use any products that contain nicotine or tobacco, such as cigarettes, e-cigarettes, and chewing tobacco. If you need help quitting, ask your health care provider. Do not use drugs. If you are sexually active, practice safe sex. Use a condom or other form of protection in order to prevent STIs (sexually transmitted infections). Talk with your health care provider about taking a low-dose aspirin or statin. Find healthy ways to cope with stress, such as: Meditation, yoga, or listening to music. Journaling. Talking to a trusted person. Spending time with friends and family. Safety Always wear your seat belt while driving or riding in a vehicle. Do not drive: If you have been drinking alcohol. Do not ride with someone who has been drinking. When you are tired or distracted. While texting. Wear a helmet and other protective equipment during sports activities. If you have firearms in your house, make sure you follow all gun safety procedures. What's next? Visit your health care provider once a year for an annual wellness visit. Ask your health care provider how often you should have your eyes and teeth checked. Stay up to date on all vaccines. This information is not intended to replace advice given to you by your health care provider. Make sure you discuss any questions you have with your healthcare provider. Document Revised: 10/11/2020 Document Reviewed: 10/15/2018 Elsevier Patient Education  2022 [employer].

## 2021-04-20 NOTE — Progress Notes (Signed)
Patient ID: Jacqueline Duran, female    DOB: [birth year]  Age: 70 y.o. MRN: [id number]    Subjective:  Subjective  HPI Jacqueline Duran presents for a comprehensive physical examination today. She reports feeling well and has no complaints today. She endorse taking 1 mg/ dose Ozempic subcutaneous weekly for her dx of type 2 diabetes. She notes that she had loss weight.  Wt Readings from Last 3 Encounters:  04/20/21 246 lb 12.8 oz (111.9 kg)  03/17/20 242 lb 12.8 oz (110.1 kg)  09/07/19 240 lb 9.6 oz (109.1 kg)  She denies any chest pain, SOB, fever, abdominal pain, cough, chills, sore throat, dysuria, urinary incontinence, back pain, HA, or N/V/D at this time. She states she sees  her optometrist regularly. She is planning on receiving a mammogram and influenza vaccine.     Review of Systems  Constitutional:  Negative for chills, fatigue and fever.  HENT:  Negative for ear pain, rhinorrhea, sinus pressure, sinus pain, sore throat and tinnitus.   Eyes:  Negative for pain.  Respiratory:  Negative for cough, shortness of breath and wheezing.   Cardiovascular:  Negative for chest pain.  Gastrointestinal:  Negative for abdominal pain, anal bleeding, constipation, diarrhea, nausea and vomiting.  Genitourinary:  Negative for flank pain.  Musculoskeletal:  Negative for back pain and neck pain.  Skin:  Negative for rash.  Neurological:  Negative for seizures, weakness, light-headedness, numbness and headaches.   History Past Medical History:  Diagnosis Date   Colon polyps    Deafness    Right Ear   Diabetes mellitus    Hyperlipidemia    Hypertension     She has a past surgical history that includes Abdominal hysterectomy; Tonsillectomy; Cervical fusion (07/2004); and Colonoscopy (8/11).   Her family history includes Alcohol abuse in some other family members; Cancer in her father; Clotting disorder in her sister; Coronary artery disease in her mother; Factor V Leiden deficiency in an other  family member; Heart attack in an other family member; Heart disease in an other family member; Leukemia in her maternal uncle; Lung cancer in her father; Stroke in an other family member.She reports that she has never smoked. She has never used smokeless tobacco. She reports that she does not drink alcohol and does not use drugs.  Current Outpatient Medications on File Prior to Visit  Medication Sig Dispense Refill   ascorbic acid (VITAMIN C) 500 MG tablet Take 1,000 mg by mouth daily.      aspirin 81 MG tablet Take 81 mg by mouth daily.       glucose blood (ONE TOUCH ULTRA TEST) test strip Check blood sugar once daily. Dx:E11.9 100 each 5   Insulin Pen Needle (BD PEN NEEDLE MICRO U/F) 32G X 6 MM MISC To use w/ Ozempic 100 each 3   Magnesium 500 MG TABS Take 1 tablet by mouth daily.     Multiple Vitamin (MULTIVITAMIN) tablet Take 1 tablet by mouth daily.       ONETOUCH DELICA LANCETS FINE MISC Check blood sugar once daily. Dx:E11.9 100 each 5   VITAMIN E PO Take by mouth.     No current facility-administered medications on file prior to visit.     Objective:  Objective  Physical Exam Vitals and nursing note reviewed.  Constitutional:      General: She is not in acute distress.    Appearance: Normal appearance. She is well-developed. She is not ill-appearing.  HENT:     Head:  Normocephalic and atraumatic.     Right Ear: Tympanic membrane, ear canal and external ear normal.     Left Ear: Tympanic membrane, ear canal and external ear normal.     Nose: Nose normal.  Eyes:     General:        Right eye: No discharge.        Left eye: No discharge.     Extraocular Movements: Extraocular movements intact.     Pupils: Pupils are equal, round, and reactive to light.  Cardiovascular:     Rate and Rhythm: Normal rate and regular rhythm.     Pulses: Normal pulses.     Heart sounds: Normal heart sounds. No murmur heard.   No friction rub. No gallop.  Pulmonary:     Effort: Pulmonary  effort is normal. No respiratory distress.     Breath sounds: Normal breath sounds. No stridor. No wheezing, rhonchi or rales.  Chest:     Chest wall: No tenderness.  Abdominal:     General: Bowel sounds are normal. There is no distension.     Palpations: Abdomen is soft. There is no mass.     Tenderness: There is no abdominal tenderness. There is no guarding or rebound.     Hernia: No hernia is present.  Musculoskeletal:        General: Normal range of motion.     Cervical back: Normal range of motion and neck supple.     Right lower leg: No edema.     Left lower leg: No edema.  Skin:    General: Skin is warm and dry.  Neurological:     Mental Status: She is alert and oriented to person, place, and time.  Psychiatric:        Behavior: Behavior normal.        Thought Content: Thought content normal.   BP 120/70 (BP Location: Left Arm, Patient Position: Sitting, Cuff Size: Large)   Pulse 70   Temp 99.7 F (37.6 C) (Oral)   Resp 18   Ht 5\' 4"  (1.626 m)   Wt 246 lb 12.8 oz (111.9 kg)   SpO2 95%   BMI 42.36 kg/m  Wt Readings from Last 3 Encounters:  04/20/21 246 lb 12.8 oz (111.9 kg)  03/17/20 242 lb 12.8 oz (110.1 kg)  09/07/19 240 lb 9.6 oz (109.1 kg)   Diabetic Foot Exam - Simple   Simple Foot Form Diabetic Foot exam was performed with the following findings: Yes 04/20/2021  9:39 AM  Visual Inspection No deformities, no ulcerations, no other skin breakdown bilaterally: Yes Sensation Testing See comments: Yes Pulse Check Posterior Tibialis and Dorsalis pulse intact bilaterally: Yes Comments Pt unable to feel monofilament except top of feet-- no change      Lab Results  Component Value Date   WBC 10.9 (H) 05/02/2017   HGB 13.6 05/02/2017   HCT 41.2 05/02/2017   PLT 176.0 05/02/2017   GLUCOSE 110 (H) 03/17/2020   CHOL 107 03/17/2020   TRIG 94.0 03/17/2020   HDL 45.70 03/17/2020   LDLCALC 43 03/17/2020   ALT 21 03/17/2020   AST 22 03/17/2020   NA 140  03/17/2020   K 4.3 03/17/2020   CL 103 03/17/2020   CREATININE 0.90 03/17/2020   BUN 16 03/17/2020   CO2 30 03/17/2020   TSH 2.18 09/07/2019   HGBA1C 6.5 03/17/2020   MICROALBUR <0.7 12/26/2017    DG Bone Density  Result Date: 08/20/2019 EXAM: DUAL X-RAY ABSORPTIOMETRY (  DXA) FOR BONE MINERAL DENSITY IMPRESSION: Rosalita Chessman CHASE Your patient Keenya Matera completed a BMD test on 08/20/2019 using the [place] (analysis version: 16.SP2) manufactured by [employer]. The following summarizes the results of our evaluation. [facility] PATIENT: Name: Cynitha, Berte Patient ID: [id number] Birth Date: 10/11/1951 Height: 64.0 in. Gender: Female Measured: 08/20/2019 Weight: 243.4 lbs. Indications: Caucasian, Diabetic, Estrogen Deficiency, Hysterectomy, Post Menopausal Fractures: Treatments: Caltrate 600 + D, Farxiga ASSESSMENT: The BMD measured at Forearm Radius 33% is 0.828 g/cm2 with a T-score of -0.5. This patient is considered normal according to [facility]) criteria. Lumbar spine was not utilized due to advanced degenerative changes. Scan quality was good. Site Region Measured Date Measured Age WHO YA BMD Classification T-score DualFemur Neck Left 08/20/2019 65.9 years Normal 0.5 1.109 g/cm2 Left Forearm Radius 33% 08/20/2019 65.9 Normal -0.5 0.828 g/cm2 World Health Organization [facility]) criteria for post-menopausal, Caucasian Women: Normal       T-score at or above -1 SD Osteopenia   T-score between -1 and -2.5 SD Osteoporosis T-score at or below -2.5 SD RECOMMENDATION: 1. All patients should optimize calcium and vitamin D intake. 2. Consider FDA-approved medical therapies in postmenopausal women and men aged 36 years and older, based on the following: a. A hip or vertebral(clinical or morphometric) fracture. b. T-Score < -2.5 at the femoral neck or spine after appropriate evaluation to exclude secondary causes c. Low bone mass (T-score between -1.0 and -2.5 at the femoral neck  or spine) and a 10 year probability of a hip fracture >3% or a 10 year probability of major osteoporosis-related fracture > 20% based on the [place]-adapted WHO algorithm d. Clinical judgement and/or patient preferences may indicate treatment for people with 10-year fracture probabilities above or below these levels FOLLOW-UP: Patients with diagnosis of osteoporosis or at high risk for fracture should have regular bone mineral density tests. For patients eligible for Medicare, routine testing is allowed once every 2 years. The testing frequency can be increased to one year for patients who have rapidly progressing disease, those who are receiving or discontinuing medical therapy to restore bone mass, or have additional risk factors. I have reviewed this report and agree with the above findings. [facility] Electronically Signed   By: Lowella Grip III M.D.   On: 08/20/2019 17:26     Assessment & Plan:  Plan   Meds ordered this encounter  Medications   Semaglutide, 1 MG/DOSE, (OZEMPIC, 1 MG/DOSE,) 2 MG/1.5ML SOPN    Sig: Inject 1 mg into the skin once a week.    Dispense:  12 mL    Refill:  3   valsartan-hydrochlorothiazide (DIOVAN-HCT) 160-12.5 MG tablet    Sig: Take 1 tablet by mouth daily.    Dispense:  90 tablet    Refill:  1   glimepiride (AMARYL) 1 MG tablet    Sig: Take 1 tablet (1 mg total) by mouth daily.    Dispense:  90 tablet    Refill:  1   folic acid (FOLVITE) 1 MG tablet    Sig: Take 1 tablet (1 mg total) by mouth daily.    Dispense:  90 tablet    Refill:  3   fenofibrate 160 MG tablet    Sig: Take 1 tablet (160 mg total) by mouth daily.    Dispense:  90 tablet    Refill:  1   dapagliflozin propanediol (FARXIGA) 10 MG TABS tablet    Sig: Take 1 tablet (10  mg total) by mouth daily.    Dispense:  90 tablet    Refill:  1   escitalopram (LEXAPRO) 20 MG tablet    Sig: Take 1 tablet (20 mg total) by mouth daily.    Dispense:  90 tablet    Refill:  3   atorvastatin  (LIPITOR) 10 MG tablet    Sig: Take 1 tablet (10 mg total) by mouth daily.    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, unspecified BMI ([place])   Relevant Medications   Semaglutide, 1 MG/DOSE, (OZEMPIC, 1 MG/DOSE,) 2 MG/1.5ML SOPN   glimepiride (AMARYL) 1 MG tablet   dapagliflozin propanediol (FARXIGA) 10 MG TABS tablet   Diabetic ulcer of left foot associated with type 2 diabetes mellitus, unspecified part of foot, unspecified ulcer stage (HCC)   Relevant Medications   Semaglutide, 1 MG/DOSE, (OZEMPIC, 1 MG/DOSE,) 2 MG/1.5ML SOPN   valsartan-hydrochlorothiazide (DIOVAN-HCT) 160-12.5 MG tablet   glimepiride (AMARYL) 1 MG tablet   dapagliflozin propanediol (FARXIGA) 10 MG TABS tablet   atorvastatin (LIPITOR) 10 MG tablet   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 Increase exercise as tolerated. Continue current meds        Relevant Medications   Semaglutide, 1 MG/DOSE, (OZEMPIC, 1 MG/DOSE,) 2 MG/1.5ML SOPN   valsartan-hydrochlorothiazide (DIOVAN-HCT) 160-12.5 MG tablet   glimepiride (AMARYL) 1 MG tablet   fenofibrate 160 MG tablet   dapagliflozin propanediol (FARXIGA) 10 MG TABS tablet   atorvastatin (LIPITOR) 10 MG tablet   Essential hypertension    Well controlled, no changes to meds. Encouraged heart healthy diet such as the DASH diet and exercise as tolerated.        Relevant Medications   valsartan-hydrochlorothiazide (DIOVAN-HCT) 160-12.5 MG tablet   fenofibrate 160 MG tablet   atorvastatin (LIPITOR) 10 MG tablet   Other Relevant Orders   Lipid panel   CBC with Differential/Platelet   Hemoglobin A1c   Microalbumin / creatinine urine ratio   Comprehensive metabolic  panel   TSH   Hyperlipidemia associated with type 2 diabetes mellitus (HCC)    Encourage heart healthy diet such as MIND or DASH diet, increase exercise, avoid trans fats, simple carbohydrates and processed foods, consider a krill or fish or flaxseed oil cap daily.        Relevant Medications   Semaglutide, 1 MG/DOSE, (OZEMPIC, 1 MG/DOSE,) 2 MG/1.5ML SOPN   valsartan-hydrochlorothiazide (DIOVAN-HCT) 160-12.5 MG tablet   glimepiride (AMARYL) 1 MG tablet   fenofibrate 160 MG tablet   dapagliflozin propanediol (FARXIGA) 10 MG TABS tablet   atorvastatin (LIPITOR) 10 MG tablet   Other Relevant Orders   Lipid panel   Comprehensive metabolic panel   Other Visit Diagnoses     Preventative health care    -  Primary   Relevant Orders   Lipid panel   CBC with Differential/Platelet   Hemoglobin A1c   Microalbumin / creatinine urine ratio   Comprehensive metabolic panel   TSH   Type 2 diabetes mellitus with diabetic neuropathy, without long-term current use of insulin (HCC)       Relevant Medications   Semaglutide, 1 MG/DOSE, (OZEMPIC,  1 MG/DOSE,) 2 MG/1.5ML SOPN   valsartan-hydrochlorothiazide (DIOVAN-HCT) 160-12.5 MG tablet   glimepiride (AMARYL) 1 MG tablet   dapagliflozin propanediol (FARXIGA) 10 MG TABS tablet   atorvastatin (LIPITOR) 10 MG tablet   Other Relevant Orders   Hemoglobin A1c   Microalbumin / creatinine urine ratio   Comprehensive metabolic panel   Elevated homocysteine       Relevant Medications   folic acid (FOLVITE) 1 MG tablet   Other Relevant Orders   Homocysteine   Hyperlipidemia, unspecified hyperlipidemia type       Relevant Medications   valsartan-hydrochlorothiazide (DIOVAN-HCT) 160-12.5 MG tablet   fenofibrate 160 MG tablet   atorvastatin (LIPITOR) 10 MG tablet   Other Relevant Orders   Lipid panel   Uncontrolled type 2 diabetes mellitus with complication, without long-term current use of insulin (HCC)       Relevant Medications   Semaglutide, 1  MG/DOSE, (OZEMPIC, 1 MG/DOSE,) 2 MG/1.5ML SOPN   valsartan-hydrochlorothiazide (DIOVAN-HCT) 160-12.5 MG tablet   glimepiride (AMARYL) 1 MG tablet   dapagliflozin propanediol (FARXIGA) 10 MG TABS tablet   atorvastatin (LIPITOR) 10 MG tablet   Other Relevant Orders   Hemoglobin A1c   Microalbumin / creatinine urine ratio   Comprehensive metabolic panel   Generalized anxiety disorder       Relevant Medications   escitalopram (LEXAPRO) 20 MG tablet   Hyperlipidemia LDL goal <70       Relevant Medications   valsartan-hydrochlorothiazide (DIOVAN-HCT) 160-12.5 MG tablet   fenofibrate 160 MG tablet   atorvastatin (LIPITOR) 10 MG tablet   Colon cancer screening       Relevant Orders   Ambulatory referral to Gastroenterology   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   Colonoscopy: Last completed on 06/12/2010, polyps noted, repeat every 10 years.   Mammo: Last completed on 07/28/2020, results were normal.  Pap Smear: Last completed on 05/10/2010, results were normal.   Diabetic Foot Exam - Simple   Simple Foot Form Diabetic Foot exam was performed with the following findings: Yes 04/20/2021  9:39 AM  Visual Inspection No deformities, no ulcerations, no other skin breakdown bilaterally: Yes Sensation Testing See comments: Yes Pulse Check Posterior Tibialis and Dorsalis pulse intact bilaterally: Yes Comments Pt unable to feel monofilament except top of feet-- no change       Follow-up: Return in about 6 months (around 10/20/2021), or if symptoms worsen or fail to improve, for hypertension, hyperlipidemia, diabetes II.   I,Gordon Zheng,acting as a scribe for [name], DO.,have documented all relevant documentation on the behalf of [name], DO,as directed by  [name], DO while in the presence of [name], DO.  I, [name], DO, have reviewed  all documentation for this visit. The documentation on 04/20/21 for the exam, diagnosis, procedures, and orders are all accurate and complete.

## 2021-04-20 NOTE — Assessment & Plan Note (Signed)
Check labs 

## 2021-04-20 NOTE — Assessment & Plan Note (Signed)
Well controlled, no changes to meds. Encouraged heart healthy diet such as the DASH diet and exercise as tolerated.  °

## 2021-04-20 NOTE — Assessment & Plan Note (Signed)
hgba1c to be checked, minimize simple carbs. Increase exercise as tolerated. Continue current meds  

## 2021-04-20 NOTE — Assessment & Plan Note (Signed)
Encourage heart healthy diet such as MIND or DASH diet, increase exercise, avoid trans fats, simple carbohydrates and processed foods, consider a krill or fish or flaxseed oil cap daily.  °

## 2021-04-23 LAB — HOMOCYSTEINE: Homocysteine: 16.3 umol/L — ABNORMAL HIGH (ref <10.4)

## 2021-05-28 ENCOUNTER — Encounter: Payer: Self-pay | Admitting: Family Medicine

## 2021-07-31 LAB — HM MAMMOGRAPHY

## 2021-10-25 ENCOUNTER — Ambulatory Visit: Payer: 59 | Admitting: Family Medicine

## 2021-10-30 ENCOUNTER — Encounter: Payer: Self-pay | Admitting: Family Medicine

## 2021-10-30 ENCOUNTER — Other Ambulatory Visit: Payer: Self-pay | Admitting: *Deleted

## 2021-10-30 DIAGNOSIS — E785 Hyperlipidemia, unspecified: Secondary | ICD-10-CM

## 2021-10-30 DIAGNOSIS — R1032 Left lower quadrant pain: Secondary | ICD-10-CM

## 2021-10-30 MED ORDER — ATORVASTATIN CALCIUM 10 MG PO TABS: 10.0000 mg | ORAL_TABLET | Freq: Every day | ORAL | 1 refills | Status: DC

## 2021-11-19 ENCOUNTER — Ambulatory Visit: Payer: 59 | Admitting: Family Medicine

## 2021-11-30 ENCOUNTER — Other Ambulatory Visit: Payer: Self-pay | Admitting: Family Medicine

## 2021-11-30 ENCOUNTER — Encounter: Payer: Self-pay | Admitting: Family Medicine

## 2021-11-30 ENCOUNTER — Ambulatory Visit: Payer: 59 | Admitting: Family Medicine

## 2021-11-30 VITALS — BP 118/80 | HR 67 | Temp 98.5°F | Resp 18 | Ht 64.0 in | Wt 244.0 lb

## 2021-11-30 DIAGNOSIS — F411 Generalized anxiety disorder: Secondary | ICD-10-CM

## 2021-11-30 DIAGNOSIS — E11621 Type 2 diabetes mellitus with foot ulcer: Secondary | ICD-10-CM

## 2021-11-30 DIAGNOSIS — R7989 Other specified abnormal findings of blood chemistry: Secondary | ICD-10-CM

## 2021-11-30 DIAGNOSIS — E1151 Type 2 diabetes mellitus with diabetic peripheral angiopathy without gangrene: Secondary | ICD-10-CM

## 2021-11-30 DIAGNOSIS — E1165 Type 2 diabetes mellitus with hyperglycemia: Secondary | ICD-10-CM | POA: Diagnosis not present

## 2021-11-30 DIAGNOSIS — I10 Essential (primary) hypertension: Secondary | ICD-10-CM

## 2021-11-30 DIAGNOSIS — E1169 Type 2 diabetes mellitus with other specified complication: Secondary | ICD-10-CM

## 2021-11-30 DIAGNOSIS — E7211 Homocystinuria: Secondary | ICD-10-CM

## 2021-11-30 DIAGNOSIS — E785 Hyperlipidemia, unspecified: Secondary | ICD-10-CM

## 2021-11-30 DIAGNOSIS — E1140 Type 2 diabetes mellitus with diabetic neuropathy, unspecified: Secondary | ICD-10-CM | POA: Diagnosis not present

## 2021-11-30 DIAGNOSIS — E662 Morbid (severe) obesity with alveolar hypoventilation: Secondary | ICD-10-CM

## 2021-11-30 DIAGNOSIS — L97529 Non-pressure chronic ulcer of other part of left foot with unspecified severity: Secondary | ICD-10-CM

## 2021-11-30 LAB — LIPID PANEL
Cholesterol: 121 mg/dL (ref 0–200)
HDL: 49.8 mg/dL (ref >39.00)
LDL Cholesterol: 51 mg/dL (ref 0–99)
NonHDL: 71.02
Total CHOL/HDL Ratio: 2
Triglycerides: 100 mg/dL (ref 0.0–149.0)
VLDL: 20 mg/dL (ref 0.0–40.0)

## 2021-11-30 LAB — COMPREHENSIVE METABOLIC PANEL
ALT: 15 U/L (ref 0–35)
AST: 21 U/L (ref 0–37)
Albumin: 4 g/dL (ref 3.5–5.2)
Alkaline Phosphatase: 71 U/L (ref 39–117)
BUN: 17 mg/dL (ref 6–23)
CO2: 33 mEq/L — ABNORMAL HIGH (ref 19–32)
Calcium: 10 mg/dL (ref 8.4–10.5)
Chloride: 103 mEq/L (ref 96–112)
Creatinine, Ser: 0.95 mg/dL (ref 0.40–1.20)
GFR: 61.66 mL/min (ref >60.00)
Glucose, Bld: 93 mg/dL (ref 70–99)
Potassium: 4.4 mEq/L (ref 3.5–5.1)
Sodium: 142 mEq/L (ref 135–145)
Total Bilirubin: 0.6 mg/dL (ref 0.2–1.2)
Total Protein: 6.8 g/dL (ref 6.0–8.3)

## 2021-11-30 LAB — MICROALBUMIN / CREATININE URINE RATIO
Creatinine,U: 76.5 mg/dL
Microalb Creat Ratio: 0.9 mg/g (ref 0.0–30.0)
Microalb, Ur: <0.7 mg/dL (ref 0.0–1.9)

## 2021-11-30 MED ORDER — ATORVASTATIN CALCIUM 10 MG PO TABS: 10.0000 mg | ORAL_TABLET | Freq: Every day | ORAL | 1 refills | Status: DC

## 2021-11-30 MED ORDER — FOLIC ACID 1 MG PO TABS: 1.0000 mg | ORAL_TABLET | Freq: Every day | ORAL | 3 refills | Status: DC

## 2021-11-30 MED ORDER — FENOFIBRATE 160 MG PO TABS: 160.0000 mg | ORAL_TABLET | Freq: Every day | ORAL | 1 refills | Status: DC

## 2021-11-30 MED ORDER — ESCITALOPRAM OXALATE 20 MG PO TABS: 20.0000 mg | ORAL_TABLET | Freq: Every day | ORAL | 3 refills | Status: DC

## 2021-11-30 MED ORDER — TIRZEPATIDE 2.5 MG/0.5ML ~~LOC~~ SOAJ: 2.5000 mg | SUBCUTANEOUS | 0 refills | Status: DC

## 2021-11-30 MED ORDER — VALSARTAN-HYDROCHLOROTHIAZIDE 160-12.5 MG PO TABS: 1.0000 | ORAL_TABLET | Freq: Every day | ORAL | 1 refills | Status: DC

## 2021-11-30 MED ORDER — TIRZEPATIDE 5 MG/0.5ML ~~LOC~~ SOAJ: 5.0000 mg | SUBCUTANEOUS | 0 refills | Status: DC

## 2021-11-30 MED ORDER — GLIMEPIRIDE 1 MG PO TABS: 1.0000 mg | ORAL_TABLET | Freq: Every day | ORAL | 1 refills | Status: DC

## 2021-11-30 MED ORDER — DAPAGLIFLOZIN PROPANEDIOL 10 MG PO TABS: 10.0000 mg | ORAL_TABLET | Freq: Every day | ORAL | 1 refills | Status: DC

## 2021-11-30 MED ORDER — OZEMPIC (1 MG/DOSE) 2 MG/1.5ML ~~LOC~~ SOPN: 1.0000 mg | PEN_INJECTOR | SUBCUTANEOUS | 1 refills | Status: DC

## 2021-11-30 NOTE — Assessment & Plan Note (Signed)
Encourage heart healthy diet such as MIND or DASH diet, increase exercise, avoid trans fats, simple carbohydrates and processed foods, consider a krill or fish or flaxseed oil cap daily.  °

## 2021-11-30 NOTE — Telephone Encounter (Signed)
Alternative Requested:NEW COUPON ONLY TAKES OFF $500 PER MONTH.

## 2021-11-30 NOTE — Patient Instructions (Signed)

## 2021-11-30 NOTE — Assessment & Plan Note (Signed)
Well controlled, no changes to meds. Encouraged heart healthy diet such as the DASH diet and exercise as tolerated.  °

## 2021-11-30 NOTE — Progress Notes (Addendum)
Subjective:   By signing my name below, I, [name], attest that this documentation has been prepared under the direction and in the presence of [name], DO. 11/30/2021    Patient ID: Jacqueline Duran, female    DOB: 08/27/1951, 71 y.o.   MRN: [id number]  Chief Complaint  Patient presents with   Hypertension   Diabetes   Hyperlipidemia    HPI Patient is in today for an office visit and 6 month f/u.  She does not check her blood sugar levels at home. She adds she still cannot feel her feet. Notes she is losing a little weight being on 1 mg ozempic but added a little weight over the holidays.   She is requesting or refills on 10 mg Lipitor, 1 mg ozempic, 1 mg Amaryl, 1 mg folvite, 160 mg fenofibrate, 20 mg lexapro, 160-12.5 mg Diovan-hct and 10 mg farxiga.  Past Medical History:  Diagnosis Date   Colon polyps    Deafness    Right Ear   Diabetes mellitus    Hyperlipidemia    Hypertension     Past Surgical History:  Procedure Laterality Date   ABDOMINAL HYSTERECTOMY     CERVICAL FUSION  07/2004   COLONOSCOPY  8/11   Hyperplastic polyps   TONSILLECTOMY      Family History  Problem Relation Age of Onset   Lung cancer Father    Cancer Father        LUNG   Heart attack Other    Alcohol abuse Other    Stroke Other        Female <50/Female <60   Clotting disorder Sister        Factor V   Coronary artery disease Mother    Leukemia Maternal Uncle    Heart disease Other    Factor V Leiden deficiency Other        THROMBOEBOLISM CLOTTING DISORDER   Alcohol abuse Other    Ankylosing spondylitis Neg Hx     Social History   Socioeconomic History   Marital status: Widowed    Spouse name: Not on file   Number of children: Not on file   Years of education: Not on file   Highest education level: Not on file  Occupational History   Occupation: unemployed  Tobacco Use   Smoking status: Never   Smokeless tobacco: Never  Substance and Sexual Activity    Alcohol use: No   Drug use: No   Sexual activity: Not Currently    Partners: Male  Other Topics Concern   Not on file  Social History Narrative   Live at home alone   Social Determinants of Health   Financial Resource Strain: Not on file  Food Insecurity: Not on file  Transportation Needs: Not on file  Physical Activity: Not on file  Stress: Not on file  Social Connections: Not on file  Intimate Partner Violence: Not on file    Outpatient Medications Prior to Visit  Medication Sig Dispense Refill   ascorbic acid (VITAMIN C) 500 MG tablet Take 1,000 mg by mouth daily.      aspirin 81 MG tablet Take 81 mg by mouth daily.       glucose blood (ONE TOUCH ULTRA TEST) test strip Check blood sugar once daily. Dx:E11.9 100 each 5   Insulin Pen Needle (BD PEN NEEDLE MICRO U/F) 32G X 6 MM MISC To use w/ Ozempic 100 each 3   Magnesium 500 MG TABS  Take 1 tablet by mouth daily.     Multiple Vitamin (MULTIVITAMIN) tablet Take 1 tablet by mouth daily.       ONETOUCH DELICA LANCETS FINE MISC Check blood sugar once daily. Dx:E11.9 100 each 5   VITAMIN E PO Take by mouth.     atorvastatin (LIPITOR) 10 MG tablet Take 1 tablet (10 mg total) by mouth daily. 90 tablet 1   dapagliflozin propanediol (FARXIGA) 10 MG TABS tablet Take 1 tablet (10 mg total) by mouth daily. 90 tablet 1   escitalopram (LEXAPRO) 20 MG tablet Take 1 tablet (20 mg total) by mouth daily. 90 tablet 3   fenofibrate 160 MG tablet Take 1 tablet (160 mg total) by mouth daily. 90 tablet 1   folic acid (FOLVITE) 1 MG tablet Take 1 tablet (1 mg total) by mouth daily. 90 tablet 3   glimepiride (AMARYL) 1 MG tablet Take 1 tablet (1 mg total) by mouth daily. 90 tablet 1   Semaglutide, 1 MG/DOSE, (OZEMPIC, 1 MG/DOSE,) 2 MG/1.5ML SOPN Inject 1 mg into the skin once a week. 12 mL 3   valsartan-hydrochlorothiazide (DIOVAN-HCT) 160-12.5 MG tablet Take 1 tablet by mouth daily. 90 tablet 1   No facility-administered medications prior to visit.     Allergies  Allergen Reactions   Amlodipine Besylate     REACTION: swelling in feet and ankles/rash/pain   Other Itching    INVOKANA    Review of Systems  Constitutional:  Negative for fever.  HENT:  Negative for congestion, ear pain, hearing loss, sinus pain and sore throat.   Eyes:  Negative for blurred vision and pain.  Respiratory:  Negative for cough, sputum production, shortness of breath and wheezing.   Cardiovascular:  Negative for chest pain and palpitations.  Gastrointestinal:  Negative for blood in stool, constipation, diarrhea, nausea and vomiting.  Genitourinary:  Negative for dysuria, frequency, hematuria and urgency.  Musculoskeletal:  Negative for back pain, falls and myalgias.  Neurological:  Negative for dizziness, sensory change, loss of consciousness, weakness and headaches.  Endo/Heme/Allergies:  Negative for environmental allergies. Does not bruise/bleed easily.  Psychiatric/Behavioral:  Negative for depression and suicidal ideas. The patient is not nervous/anxious and does not have insomnia.       Objective:    Physical Exam Constitutional:      General: She is not in acute distress.    Appearance: Normal appearance. She is not ill-appearing.  HENT:     Head: Normocephalic and atraumatic.     Right Ear: External ear normal.     Left Ear: External ear normal.  Eyes:     Extraocular Movements: Extraocular movements intact.     Pupils: Pupils are equal, round, and reactive to light.  Cardiovascular:     Rate and Rhythm: Normal rate and regular rhythm.     Pulses: Normal pulses.     Heart sounds: Normal heart sounds. No murmur heard.   No gallop.  Pulmonary:     Effort: Pulmonary effort is normal. No respiratory distress.     Breath sounds: Normal breath sounds. No wheezing, rhonchi or rales.  Abdominal:     General: Bowel sounds are normal. There is no distension.     Palpations: Abdomen is soft. There is no mass.     Tenderness: There is no  abdominal tenderness. There is no guarding or rebound.     Hernia: No hernia is present.  Musculoskeletal:     Cervical back: Normal range of motion and neck supple.  Lymphadenopathy:     Cervical: No cervical adenopathy.  Skin:    General: Skin is warm and dry.  Neurological:     Mental Status: She is alert and oriented to person, place, and time.  Psychiatric:        Behavior: Behavior normal.    BP 118/80 (BP Location: Left Arm, Patient Position: Sitting, Cuff Size: Normal)    Pulse 67    Temp 98.5 F (36.9 C) (Oral)    Resp 18    Ht 5\' 4"  (1.626 m)    Wt 244 lb (110.7 kg)    SpO2 95%    BMI 41.88 kg/m  Wt Readings from Last 3 Encounters:  11/30/21 244 lb (110.7 kg)  04/20/21 246 lb 12.8 oz (111.9 kg)  03/17/20 242 lb 12.8 oz (110.1 kg)    Diabetic Foot Exam - Simple   No data filed    Lab Results  Component Value Date   WBC 8.5 04/20/2021   HGB 13.9 04/20/2021   HCT 41.8 04/20/2021   PLT 145.0 (L) 04/20/2021   GLUCOSE 104 (H) 04/20/2021   CHOL 114 04/20/2021   TRIG 106.0 04/20/2021   HDL 48.80 04/20/2021   LDLCALC 44 04/20/2021   ALT 17 04/20/2021   AST 17 04/20/2021   NA 143 04/20/2021   K 4.3 04/20/2021   CL 106 04/20/2021   CREATININE 0.93 04/20/2021   BUN 21 04/20/2021   CO2 29 04/20/2021   TSH 1.97 04/20/2021   HGBA1C 6.3 04/20/2021   MICROALBUR <0.7 04/20/2021    Lab Results  Component Value Date   TSH 1.97 04/20/2021   Lab Results  Component Value Date   WBC 8.5 04/20/2021   HGB 13.9 04/20/2021   HCT 41.8 04/20/2021   MCV 92.8 04/20/2021   PLT 145.0 (L) 04/20/2021   Lab Results  Component Value Date   NA 143 04/20/2021   K 4.3 04/20/2021   CO2 29 04/20/2021   GLUCOSE 104 (H) 04/20/2021   BUN 21 04/20/2021   CREATININE 0.93 04/20/2021   BILITOT 0.4 04/20/2021   ALKPHOS 86 04/20/2021   AST 17 04/20/2021   ALT 17 04/20/2021   PROT 6.7 04/20/2021   ALBUMIN 4.0 04/20/2021   CALCIUM 9.6 04/20/2021   GFR 63.53 04/20/2021   Lab  Results  Component Value Date   CHOL 114 04/20/2021   Lab Results  Component Value Date   HDL 48.80 04/20/2021   Lab Results  Component Value Date   LDLCALC 44 04/20/2021   Lab Results  Component Value Date   TRIG 106.0 04/20/2021   Lab Results  Component Value Date   CHOLHDL 2 04/20/2021   Lab Results  Component Value Date   HGBA1C 6.3 04/20/2021       Assessment & Plan:   Problem List Items Addressed This Visit       Unprioritized   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, unspecified BMI ([place])   Relevant Medications   glimepiride (AMARYL) 1 MG tablet   dapagliflozin propanediol (FARXIGA) 10 MG TABS tablet   tirzepatide (MOUNJARO) 5 MG/0.5ML Pen   tirzepatide (MOUNJARO) 2.5 MG/0.5ML Pen   Diabetic ulcer of left foot associated with type 2 diabetes mellitus, unspecified part of foot, unspecified ulcer stage (HCC)   Relevant Medications   valsartan-hydrochlorothiazide (DIOVAN-HCT) 160-12.5 MG tablet   glimepiride (AMARYL) 1 MG tablet   dapagliflozin propanediol (FARXIGA) 10 MG TABS tablet   atorvastatin (LIPITOR) 10 MG tablet  tirzepatide [facility]) 5 MG/0.5ML Pen   tirzepatide (MOUNJARO) 2.5 MG/0.5ML Pen   Essential hypertension    Well controlled, no changes to meds. Encouraged heart healthy diet such as the DASH diet and exercise as tolerated.       Relevant Medications   valsartan-hydrochlorothiazide (DIOVAN-HCT) 160-12.5 MG tablet   fenofibrate 160 MG tablet   atorvastatin (LIPITOR) 10 MG tablet   Hyperlipidemia associated with type 2 diabetes mellitus (HCC)    Encourage heart healthy diet such as MIND or DASH diet, increase exercise, avoid trans fats, simple carbohydrates and processed foods, consider a krill or fish or flaxseed oil cap daily.       Relevant Medications   valsartan-hydrochlorothiazide (DIOVAN-HCT) 160-12.5 MG tablet   glimepiride (AMARYL) 1 MG tablet   fenofibrate 160 MG tablet    dapagliflozin propanediol (FARXIGA) 10 MG TABS tablet   atorvastatin (LIPITOR) 10 MG tablet   tirzepatide (MOUNJARO) 5 MG/0.5ML Pen   tirzepatide (MOUNJARO) 2.5 MG/0.5ML Pen   Other Relevant Orders   Comprehensive metabolic panel   Hemoglobin A1c   Lipid panel   Microalbumin / creatinine urine ratio   Type 2 diabetes mellitus with diabetic peripheral angiopathy without gangrene, without long-term current use of insulin (HCC)    hgba1c to be checked, minimize simple carbs. Increase exercise as tolerated. Continue current meds       Relevant Medications   valsartan-hydrochlorothiazide (DIOVAN-HCT) 160-12.5 MG tablet   glimepiride (AMARYL) 1 MG tablet   fenofibrate 160 MG tablet   dapagliflozin propanediol (FARXIGA) 10 MG TABS tablet   atorvastatin (LIPITOR) 10 MG tablet   tirzepatide (MOUNJARO) 5 MG/0.5ML Pen   tirzepatide (MOUNJARO) 2.5 MG/0.5ML Pen   Other Visit Diagnoses     Type 2 diabetes mellitus with hyperglycemia, without long-term current use of insulin (HCC)    -  Primary   Relevant Medications   valsartan-hydrochlorothiazide (DIOVAN-HCT) 160-12.5 MG tablet   glimepiride (AMARYL) 1 MG tablet   dapagliflozin propanediol (FARXIGA) 10 MG TABS tablet   atorvastatin (LIPITOR) 10 MG tablet   tirzepatide (MOUNJARO) 5 MG/0.5ML Pen   tirzepatide (MOUNJARO) 2.5 MG/0.5ML Pen   Other Relevant Orders   Comprehensive metabolic panel   Hemoglobin A1c   Lipid panel   Microalbumin / creatinine urine ratio   Primary hypertension       Relevant Medications   valsartan-hydrochlorothiazide (DIOVAN-HCT) 160-12.5 MG tablet   fenofibrate 160 MG tablet   dapagliflozin propanediol (FARXIGA) 10 MG TABS tablet   atorvastatin (LIPITOR) 10 MG tablet   Other Relevant Orders   Comprehensive metabolic panel   Hemoglobin A1c   Lipid panel   Microalbumin / creatinine urine ratio   Type 2 diabetes mellitus with diabetic neuropathy, without long-term current use of insulin (HCC)       Relevant  Medications   valsartan-hydrochlorothiazide (DIOVAN-HCT) 160-12.5 MG tablet   glimepiride (AMARYL) 1 MG tablet   dapagliflozin propanediol (FARXIGA) 10 MG TABS tablet   atorvastatin (LIPITOR) 10 MG tablet   tirzepatide (MOUNJARO) 5 MG/0.5ML Pen   tirzepatide (MOUNJARO) 2.5 MG/0.5ML Pen   Elevated homocysteine       Relevant Medications   folic acid (FOLVITE) 1 MG tablet   Hyperlipidemia, unspecified hyperlipidemia type       Relevant Medications   valsartan-hydrochlorothiazide (DIOVAN-HCT) 160-12.5 MG tablet   fenofibrate 160 MG tablet   atorvastatin (LIPITOR) 10 MG tablet   Generalized anxiety disorder       Relevant Medications   escitalopram (LEXAPRO) 20 MG tablet   Hyperlipidemia  LDL goal <70       Relevant Medications   valsartan-hydrochlorothiazide (DIOVAN-HCT) 160-12.5 MG tablet   fenofibrate 160 MG tablet   atorvastatin (LIPITOR) 10 MG tablet   Homocystinuria (HCC)   (Chronic)         Meds ordered this encounter  Medications   valsartan-hydrochlorothiazide (DIOVAN-HCT) 160-12.5 MG tablet    Sig: Take 1 tablet by mouth daily.    Dispense:  90 tablet    Refill:  1   glimepiride (AMARYL) 1 MG tablet    Sig: Take 1 tablet (1 mg total) by mouth daily.    Dispense:  90 tablet    Refill:  1   folic acid (FOLVITE) 1 MG tablet    Sig: Take 1 tablet (1 mg total) by mouth daily.    Dispense:  90 tablet    Refill:  3   fenofibrate 160 MG tablet    Sig: Take 1 tablet (160 mg total) by mouth daily.    Dispense:  90 tablet    Refill:  1   escitalopram (LEXAPRO) 20 MG tablet    Sig: Take 1 tablet (20 mg total) by mouth daily.    Dispense:  90 tablet    Refill:  3   dapagliflozin propanediol (FARXIGA) 10 MG TABS tablet    Sig: Take 1 tablet (10 mg total) by mouth daily.    Dispense:  90 tablet    Refill:  1   atorvastatin (LIPITOR) 10 MG tablet    Sig: Take 1 tablet (10 mg total) by mouth daily.    Dispense:  90 tablet    Refill:  1   tirzepatide (MOUNJARO) 5 MG/0.5ML  Pen    Sig: Inject 5 mg into the skin once a week.    Dispense:  6 mL    Refill:  0   tirzepatide (MOUNJARO) 2.5 MG/0.5ML Pen    Sig: Inject 2.5 mg into the skin once a week.    Dispense:  2 mL    Refill:  0    I,[name],acting as a scribe for [employer], DO.,have documented all relevant documentation on the behalf of [name], DO,as directed by  [name], DO while in the presence of [name], DO.   I, [name], DO., personally preformed the services described in this documentation.  All medical record entries made by the scribe were at my direction and in my presence.  I have reviewed the chart and discharge instructions (if applicable) and agree that the record reflects my personal performance and is accurate and complete. 11/30/2021

## 2021-11-30 NOTE — Assessment & Plan Note (Signed)
hgba1c to be checked, minimize simple carbs. Increase exercise as tolerated. Continue current meds  

## 2021-12-03 LAB — HEMOGLOBIN A1C: Hgb A1c MFr Bld: 6 % (ref 4.6–6.5)

## 2022-01-17 ENCOUNTER — Encounter: Payer: Self-pay | Admitting: Family Medicine

## 2022-01-18 ENCOUNTER — Ambulatory Visit: Payer: 59 | Admitting: Family Medicine

## 2022-01-18 ENCOUNTER — Encounter: Payer: Self-pay | Admitting: Family Medicine

## 2022-01-18 VITALS — BP 118/78 | HR 58 | Temp 98.4°F | Resp 18 | Ht 64.0 in | Wt 241.6 lb

## 2022-01-18 DIAGNOSIS — W57XXXA Bitten or stung by nonvenomous insect and other nonvenomous arthropods, initial encounter: Secondary | ICD-10-CM

## 2022-01-18 DIAGNOSIS — J0140 Acute pansinusitis, unspecified: Secondary | ICD-10-CM

## 2022-01-18 DIAGNOSIS — S40861A Insect bite (nonvenomous) of right upper arm, initial encounter: Secondary | ICD-10-CM

## 2022-01-18 MED ORDER — DOXYCYCLINE HYCLATE 100 MG PO TABS: 100.0000 mg | ORAL_TABLET | Freq: Two times a day (BID) | ORAL | 0 refills | Status: DC

## 2022-01-18 NOTE — Patient Instructions (Signed)

## 2022-01-18 NOTE — Assessment & Plan Note (Signed)
abx per orders  ?con't flonase and allegra  ? ?

## 2022-01-18 NOTE — Progress Notes (Signed)
? ?Subjective:  ? ?By signing my name below, I, Jacqueline Duran, attest that this documentation has been prepared under the direction and in the presence of [name], DO. 01/18/2022   ? ? Patient ID: Jacqueline Duran, female    DOB: 16-Oct-1951, 71 y.o.   MRN: [id number] ? ?Chief Complaint  ?Patient presents with  ? Dizziness  ?  Pt states sxs started Monday morning. Pt taking Claritin and Sudafed. Pt states having some pain in her right ear.    ? ? ?HPI ?Patient is in today for an office visit. ? ?She reports she has been experiencing dizziness through the week. She was at a ball game yesterday and fell suddenly. She also has sinus pressure behind her eyes and nose. She has been using Claritin and sudafed. The feeling of dizziness is worse when she leans her head back.  ? ?She reports a tick bite on her right arm that happened on Monday. ? ?Past Medical History:  ?Diagnosis Date  ? Colon polyps   ? Deafness   ? Right Ear  ? Diabetes mellitus   ? Hyperlipidemia   ? Hypertension   ? ? ?Past Surgical History:  ?Procedure Laterality Date  ? ABDOMINAL HYSTERECTOMY    ? CERVICAL FUSION  07/2004  ? COLONOSCOPY  8/11  ? Hyperplastic polyps  ? TONSILLECTOMY    ? ? ?Family History  ?Problem Relation Age of Onset  ? Lung cancer Father   ? Cancer Father   ?     LUNG  ? Heart attack Other   ? Alcohol abuse Other   ? Stroke Other   ?     Female <50/Female <60  ? Clotting disorder Sister   ?     Factor V  ? Coronary artery disease Mother   ? Leukemia Maternal Uncle   ? Heart disease Other   ? Factor V Leiden deficiency Other   ?     THROMBOEBOLISM CLOTTING DISORDER  ? Alcohol abuse Other   ? Ankylosing spondylitis Neg Hx   ? ? ?Social History  ? ?Socioeconomic History  ? Marital status: Widowed  ?  Spouse name: Not on file  ? Number of children: Not on file  ? Years of education: Not on file  ? Highest education level: Not on file  ?Occupational History  ? Occupation: unemployed  ?Tobacco Use  ? Smoking status: Never  ?  Smokeless tobacco: Never  ?Substance and Sexual Activity  ? Alcohol use: No  ? Drug use: No  ? Sexual activity: Not Currently  ?  Partners: Male  ?Other Topics Concern  ? Not on file  ?Social History Narrative  ? Live at home alone  ? ?Social Determinants of Health  ? ?Financial Resource Strain: Not on file  ?Food Insecurity: Not on file  ?Transportation Needs: Not on file  ?Physical Activity: Not on file  ?Stress: Not on file  ?Social Connections: Not on file  ?Intimate Partner Violence: Not on file  ? ? ?Outpatient Medications Prior to Visit  ?Medication Sig Dispense Refill  ? ascorbic acid (VITAMIN C) 500 MG tablet Take 1,000 mg by mouth daily.     ? aspirin 81 MG tablet Take 81 mg by mouth daily.      ? atorvastatin (LIPITOR) 10 MG tablet Take 1 tablet (10 mg total) by mouth daily. 90 tablet 1  ? dapagliflozin propanediol (FARXIGA) 10 MG TABS tablet Take 1 tablet (10 mg total) by mouth daily. [place]  tablet 1  ? escitalopram (LEXAPRO) 20 MG tablet Take 1 tablet (20 mg total) by mouth daily. 90 tablet 3  ? fenofibrate 160 MG tablet Take 1 tablet (160 mg total) by mouth daily. 90 tablet 1  ? folic acid (FOLVITE) 1 MG tablet Take 1 tablet (1 mg total) by mouth daily. 90 tablet 3  ? glimepiride (AMARYL) 1 MG tablet Take 1 tablet (1 mg total) by mouth daily. 90 tablet 1  ? glucose blood (ONE TOUCH ULTRA TEST) test strip Check blood sugar once daily. Dx:E11.9 100 each 5  ? Insulin Pen Needle (BD PEN NEEDLE MICRO U/F) 32G X 6 MM MISC To use w/ Ozempic 100 each 3  ? Magnesium 500 MG TABS Take 1 tablet by mouth daily.    ? Multiple Vitamin (MULTIVITAMIN) tablet Take 1 tablet by mouth daily.      ? ONETOUCH DELICA LANCETS FINE MISC Check blood sugar once daily. Dx:E11.9 100 each 5  ? Semaglutide, 1 MG/DOSE, (OZEMPIC, 1 MG/DOSE,) 2 MG/1.5ML SOPN Inject 1 mg into the skin once a week. 6 mL 1  ? valsartan-hydrochlorothiazide (DIOVAN-HCT) 160-12.5 MG tablet Take 1 tablet by mouth daily. 90 tablet 1  ? VITAMIN E PO Take by mouth.     ? ?No facility-administered medications prior to visit.  ? ? ?Allergies  ?Allergen Reactions  ? Amlodipine Besylate   ?  REACTION: swelling in feet and ankles/rash/pain  ? Other Itching  ?  INVOKANA  ? ? ?Review of Systems  ?Constitutional:  Negative for fever.  ?HENT:  Negative for congestion, ear pain, hearing loss, sinus pain and sore throat.   ?     (+) sinus pressure  ?Eyes:  Negative for blurred vision and pain.  ?Respiratory:  Negative for cough, sputum production, shortness of breath and wheezing.   ?Cardiovascular:  Negative for chest pain and palpitations.  ?Gastrointestinal:  Negative for blood in stool, constipation, diarrhea, nausea and vomiting.  ?Genitourinary:  Negative for dysuria, frequency, hematuria and urgency.  ?Musculoskeletal:  Positive for falls. Negative for back pain and myalgias.  ?Skin:   ?     (+) tick bite on right arm  ?Neurological:  Positive for dizziness. Negative for sensory change, loss of consciousness, weakness and headaches.  ?Endo/Heme/Allergies:  Negative for environmental allergies. Does not bruise/bleed easily.  ?Psychiatric/Behavioral:  Negative for depression and suicidal ideas. The patient is not nervous/anxious and does not have insomnia.   ? ?   ?Objective:  ?  ?Physical Exam ?Constitutional:   ?   General: She is not in acute distress. ?   Appearance: Normal appearance. She is not ill-appearing.  ?HENT:  ?   Head: Normocephalic and atraumatic.  ?   Right Ear: External ear normal.  ?   Left Ear: External ear normal.  ?Eyes:  ?   Extraocular Movements: Extraocular movements intact.  ?   Pupils: Pupils are equal, round, and reactive to light.  ?Cardiovascular:  ?   Rate and Rhythm: Normal rate and regular rhythm.  ?   Pulses: Normal pulses.  ?   Heart sounds: Normal heart sounds. No murmur heard. ?  No gallop.  ?Pulmonary:  ?   Effort: Pulmonary effort is normal. No respiratory distress.  ?   Breath sounds: Normal breath sounds. No wheezing, rhonchi or rales.   ?Abdominal:  ?   General: Bowel sounds are normal. There is no distension.  ?   Palpations: Abdomen is soft. There is no mass.  ?  Tenderness: There is no abdominal tenderness. There is no guarding or rebound.  ?   Hernia: No hernia is present.  ?Musculoskeletal:  ?   Cervical back: Normal range of motion and neck supple.  ?Lymphadenopathy:  ?   Cervical: No cervical adenopathy.  ?Skin: ?   General: Skin is warm and dry.  ?Neurological:  ?   Mental Status: She is alert and oriented to person, place, and time.  ?Psychiatric:     ?   Behavior: Behavior normal.  ? ? ?BP 118/78 (BP Location: Left Arm, Patient Position: Sitting, Cuff Size: Large)   Pulse (!) 58   Temp 98.4 ?F (36.9 ?C) (Oral)   Resp 18   Ht '5\' 4"'$  (1.626 m)   Wt 241 lb 9.6 oz (109.6 kg)   SpO2 95%   BMI 41.47 kg/m?  ?Wt Readings from Last 3 Encounters:  ?01/18/22 241 lb 9.6 oz (109.6 kg)  ?11/30/21 244 lb (110.7 kg)  ?04/20/21 246 lb 12.8 oz (111.9 kg)  ? ? ?Diabetic Foot Exam - Simple   ?No data filed ?  ? ?Lab Results  ?Component Value Date  ? WBC 8.5 04/20/2021  ? HGB 13.9 04/20/2021  ? HCT 41.8 04/20/2021  ? PLT 145.0 (L) 04/20/2021  ? GLUCOSE 93 11/30/2021  ? CHOL 121 11/30/2021  ? TRIG 100.0 11/30/2021  ? HDL 49.80 11/30/2021  ? [place] 51 11/30/2021  ? ALT 15 11/30/2021  ? AST 21 11/30/2021  ? NA 142 11/30/2021  ? K 4.4 11/30/2021  ? CL 103 11/30/2021  ? CREATININE 0.95 11/30/2021  ? BUN 17 11/30/2021  ? CO2 33 (H) 11/30/2021  ? TSH 1.97 04/20/2021  ? HGBA1C 6.0 11/30/2021  ? MICROALBUR <0.7 11/30/2021  ? ? ?Lab Results  ?Component Value Date  ? TSH 1.97 04/20/2021  ? ?Lab Results  ?Component Value Date  ? WBC 8.5 04/20/2021  ? HGB 13.9 04/20/2021  ? HCT 41.8 04/20/2021  ? MCV 92.8 04/20/2021  ? PLT 145.0 (L) 04/20/2021  ? ?Lab Results  ?Component Value Date  ? NA 142 11/30/2021  ? K 4.4 11/30/2021  ? CO2 33 (H) 11/30/2021  ? GLUCOSE 93 11/30/2021  ? BUN 17 11/30/2021  ? CREATININE 0.95 11/30/2021  ? BILITOT 0.6 11/30/2021  ? ALKPHOS 71  11/30/2021  ? AST 21 11/30/2021  ? ALT 15 11/30/2021  ? PROT 6.8 11/30/2021  ? ALBUMIN 4.0 11/30/2021  ? CALCIUM 10.0 11/30/2021  ? GFR 61.66 11/30/2021  ? ?Lab Results  ?Component Value Date  ? CHOL 121 11/30/2021  ? ?Lab

## 2022-02-05 LAB — HM DIABETES EYE EXAM

## 2022-02-06 ENCOUNTER — Encounter: Payer: Self-pay | Admitting: Family Medicine

## 2022-02-06 ENCOUNTER — Encounter: Payer: Self-pay | Admitting: *Deleted

## 2022-04-25 ENCOUNTER — Encounter: Payer: 59 | Admitting: Family Medicine

## 2022-05-10 ENCOUNTER — Encounter: Payer: 59 | Admitting: Family Medicine

## 2022-06-07 ENCOUNTER — Encounter: Payer: Self-pay | Admitting: Family Medicine

## 2022-06-17 ENCOUNTER — Ambulatory Visit (INDEPENDENT_AMBULATORY_CARE_PROVIDER_SITE_OTHER): Payer: 59 | Admitting: Family Medicine

## 2022-06-17 ENCOUNTER — Encounter: Payer: Self-pay | Admitting: Family Medicine

## 2022-06-17 VITALS — BP 120/68 | HR 63 | Temp 97.8°F | Resp 18 | Ht 64.0 in | Wt 245.4 lb

## 2022-06-17 DIAGNOSIS — E1169 Type 2 diabetes mellitus with other specified complication: Secondary | ICD-10-CM

## 2022-06-17 DIAGNOSIS — E1165 Type 2 diabetes mellitus with hyperglycemia: Secondary | ICD-10-CM | POA: Diagnosis not present

## 2022-06-17 DIAGNOSIS — E1151 Type 2 diabetes mellitus with diabetic peripheral angiopathy without gangrene: Secondary | ICD-10-CM | POA: Diagnosis not present

## 2022-06-17 DIAGNOSIS — G43809 Other migraine, not intractable, without status migrainosus: Secondary | ICD-10-CM

## 2022-06-17 DIAGNOSIS — Z0000 Encounter for general adult medical examination without abnormal findings: Secondary | ICD-10-CM

## 2022-06-17 DIAGNOSIS — E2839 Other primary ovarian failure: Secondary | ICD-10-CM | POA: Diagnosis not present

## 2022-06-17 DIAGNOSIS — I10 Essential (primary) hypertension: Secondary | ICD-10-CM

## 2022-06-17 DIAGNOSIS — E785 Hyperlipidemia, unspecified: Secondary | ICD-10-CM | POA: Diagnosis not present

## 2022-06-17 DIAGNOSIS — Z23 Encounter for immunization: Secondary | ICD-10-CM

## 2022-06-17 LAB — LIPID PANEL
Cholesterol: 108 mg/dL (ref 0–200)
HDL: 46 mg/dL (ref >39.00)
LDL Cholesterol: 43 mg/dL (ref 0–99)
NonHDL: 61.87
Total CHOL/HDL Ratio: 2
Triglycerides: 95 mg/dL (ref 0.0–149.0)
VLDL: 19 mg/dL (ref 0.0–40.0)

## 2022-06-17 LAB — CBC WITH DIFFERENTIAL/PLATELET
Basophils Absolute: 0 10*3/uL (ref 0.0–0.1)
Basophils Relative: 0.6 % (ref 0.0–3.0)
Eosinophils Absolute: 0.2 10*3/uL (ref 0.0–0.7)
Eosinophils Relative: 2.7 % (ref 0.0–5.0)
HCT: 40.3 % (ref 36.0–46.0)
Hemoglobin: 13.2 g/dL (ref 12.0–15.0)
Lymphocytes Relative: 24.9 % (ref 12.0–46.0)
Lymphs Abs: 2.1 10*3/uL (ref 0.7–4.0)
MCHC: 32.6 g/dL (ref 30.0–36.0)
MCV: 93.1 fl (ref 78.0–100.0)
Monocytes Absolute: 0.8 10*3/uL (ref 0.1–1.0)
Monocytes Relative: 8.8 % (ref 3.0–12.0)
Neutro Abs: 5.4 10*3/uL (ref 1.4–7.7)
Neutrophils Relative %: 63 % (ref 43.0–77.0)
Platelets: 155 10*3/uL (ref 150.0–400.0)
RBC: 4.33 Mil/uL (ref 3.87–5.11)
RDW: 13.5 % (ref 11.5–15.5)
WBC: 8.6 10*3/uL (ref 4.0–10.5)

## 2022-06-17 LAB — MICROALBUMIN / CREATININE URINE RATIO
Creatinine,U: 87.1 mg/dL
Microalb Creat Ratio: 0.8 mg/g (ref 0.0–30.0)
Microalb, Ur: <0.7 mg/dL (ref 0.0–1.9)

## 2022-06-17 LAB — COMPREHENSIVE METABOLIC PANEL
ALT: 13 U/L (ref 0–35)
AST: 18 U/L (ref 0–37)
Albumin: 3.9 g/dL (ref 3.5–5.2)
Alkaline Phosphatase: 77 U/L (ref 39–117)
BUN: 16 mg/dL (ref 6–23)
CO2: 31 mEq/L (ref 19–32)
Calcium: 9 mg/dL (ref 8.4–10.5)
Chloride: 102 mEq/L (ref 96–112)
Creatinine, Ser: 0.92 mg/dL (ref 0.40–1.20)
GFR: 63.84 mL/min (ref >60.00)
Glucose, Bld: 81 mg/dL (ref 70–99)
Potassium: 4.2 mEq/L (ref 3.5–5.1)
Sodium: 141 mEq/L (ref 135–145)
Total Bilirubin: 0.4 mg/dL (ref 0.2–1.2)
Total Protein: 6.6 g/dL (ref 6.0–8.3)

## 2022-06-17 LAB — HEMOGLOBIN A1C: Hgb A1c MFr Bld: 6.2 % (ref 4.6–6.5)

## 2022-06-17 MED ORDER — NURTEC 75 MG PO TBDP: ORAL_TABLET | ORAL | 1 refills | Status: DC

## 2022-06-17 NOTE — Assessment & Plan Note (Signed)
Encourage heart healthy diet such as MIND or DASH diet, increase exercise, avoid trans fats, simple carbohydrates and processed foods, consider a krill or fish or flaxseed oil cap daily.  °

## 2022-06-17 NOTE — Assessment & Plan Note (Signed)
hgba1c to be checked, minimize simple carbs. Increase exercise as tolerated. Continue current meds  

## 2022-06-17 NOTE — Progress Notes (Addendum)
Subjective:   By signing my name below, I, Carylon Perches, attest that this documentation has been prepared under the direction and in the presence of [name] DO 06/17/2022   Patient ID: Jacqueline Duran, female    DOB: August 11, 1951, 71 y.o.   MRN: [id number]  Chief Complaint  Patient presents with   Annual Exam    Pt states fasting     HPI Patient is in today for a comprehensive physical exam  She complains of her right arm and shoulder locks up, the area is also painful. She believes that the pain could be due to arthritis.   She reports that she was switched from Ozempic to [place]. She has not gotten the medication of Mounjaro yet. She wants to wait until after November to start [facility].   Her A1C levels are normal.  Lab Results  Component Value Date   HGBA1C 6.0 11/30/2021   She states that she has added Fish Oil and Brain Supplements to her diet.   She reports that she has been getting migraines recently. She is inquiring about pills or injections for her symptoms. Her migraines appear about once a month. She is sometimes able to prevent the onset of her symptoms. She used to take a medication called Triad for her symptoms.   She denies having any fever, new muscle pain, joint pain , new moles, congestion, sinus pain, sore throat, chest pain, palpations, cough, SOB ,wheezing,n/v/d constipation, blood in stool, dysuria, frequency, hematuria, at this time  She reports no recent surgeries. She denies of any changes to her family history. She reports that her mother died from a stomach bleed. Her father died from cancer. Her sister is diagnosed with Factor V and Ankylosing Spondylitis. Her youngest brother is an alcoholic.   Colonoscopy last completed on 06/12/2010. She is overdue for a colonoscopy and states that she does not have a designated driver.   Dexa last completed on 08/20/2019. She takes Calcium with Vitamin D supplements daily.   Pap Smear last  completed on 05/10/2010 Mammogram last completed on 07/31/2021 She reports that she has received two doses of the Shingrix vaccines. She is interested in receiving an pneumonia vaccine during today's visit.  Diet: Exercise: She is not UTD on dental exams.  She reports that she is UTD on vision exams.    Past Medical History:  Diagnosis Date   Colon polyps    Deafness    Right Ear   Diabetes mellitus    Hyperlipidemia    Hypertension     Past Surgical History:  Procedure Laterality Date   ABDOMINAL HYSTERECTOMY     CERVICAL FUSION  07/2004   COLONOSCOPY  8/11   Hyperplastic polyps   TONSILLECTOMY      Family History  Problem Relation Age of Onset   Coronary artery disease Mother    GI Bleed Mother    Cancer Father        LUNG   Lung cancer Father    Clotting disorder Sister        Factor V   Factor V Leiden deficiency Sister    Ankylosing spondylitis Sister    Factor V Leiden deficiency Brother    Leukemia Maternal Uncle    Heart attack Other    Alcohol abuse Other    Stroke Other        Female <50/Female <60   Heart disease Other    Factor V Leiden deficiency Other  THROMBOEBOLISM CLOTTING DISORDER   Alcohol abuse Other     Social History   Socioeconomic History   Marital status: Widowed    Spouse name: Not on file   Number of children: Not on file   Years of education: Not on file   Highest education level: Not on file  Occupational History   Occupation: unemployed  Tobacco Use   Smoking status: Never   Smokeless tobacco: Never  Substance and Sexual Activity   Alcohol use: No   Drug use: No   Sexual activity: Not Currently    Partners: Male  Other Topics Concern   Not on file  Social History Narrative   Live at home alone   Social Determinants of Health   Financial Resource Strain: Not on file  Food Insecurity: Not on file  Transportation Needs: Not on file  Physical Activity: Not on file  Stress: Not on file  Social Connections:  Not on file  Intimate Partner Violence: Not on file    Outpatient Medications Prior to Visit  Medication Sig Dispense Refill   ascorbic acid (VITAMIN C) 500 MG tablet Take 1,000 mg by mouth daily.      aspirin 81 MG tablet Take 81 mg by mouth daily.       atorvastatin (LIPITOR) 10 MG tablet Take 1 tablet (10 mg total) by mouth daily. 90 tablet 1   dapagliflozin propanediol (FARXIGA) 10 MG TABS tablet Take 1 tablet (10 mg total) by mouth daily. 90 tablet 1   escitalopram (LEXAPRO) 20 MG tablet Take 1 tablet (20 mg total) by mouth daily. 90 tablet 3   fenofibrate 160 MG tablet Take 1 tablet (160 mg total) by mouth daily. 90 tablet 1   folic acid (FOLVITE) 1 MG tablet Take 1 tablet (1 mg total) by mouth daily. 90 tablet 3   glimepiride (AMARYL) 1 MG tablet Take 1 tablet (1 mg total) by mouth daily. 90 tablet 1   glucose blood (ONE TOUCH ULTRA TEST) test strip Check blood sugar once daily. Dx:E11.9 100 each 5   Insulin Pen Needle (BD PEN NEEDLE MICRO U/F) 32G X 6 MM MISC To use w/ Ozempic 100 each 3   Magnesium 500 MG TABS Take 1 tablet by mouth daily.     Multiple Vitamin (MULTIVITAMIN) tablet Take 1 tablet by mouth daily.       ONETOUCH DELICA LANCETS FINE MISC Check blood sugar once daily. Dx:E11.9 100 each 5   Semaglutide, 1 MG/DOSE, (OZEMPIC, 1 MG/DOSE,) 2 MG/1.5ML SOPN Inject 1 mg into the skin once a week. 6 mL 1   valsartan-hydrochlorothiazide (DIOVAN-HCT) 160-12.5 MG tablet Take 1 tablet by mouth daily. 90 tablet 1   VITAMIN E PO Take by mouth.     doxycycline (VIBRA-TABS) 100 MG tablet Take 1 tablet (100 mg total) by mouth 2 (two) times daily. (Patient not taking: Reported on 06/17/2022) 20 tablet 0   No facility-administered medications prior to visit.    Allergies  Allergen Reactions   Amlodipine Besylate     REACTION: swelling in feet and ankles/rash/pain   Other Itching    INVOKANA    Review of Systems  Constitutional:  Negative for fever.  HENT:  Negative for  congestion, sinus pain and sore throat.   Respiratory:  Negative for cough, shortness of breath and wheezing.   Cardiovascular:  Negative for chest pain and palpitations.  Gastrointestinal:  Negative for blood in stool, constipation, diarrhea, nausea and vomiting.  Genitourinary:  Negative for dysuria,  frequency and hematuria.  Musculoskeletal:  Negative for joint pain and myalgias.  Skin:        (-) New Moles       Objective:    Physical Exam Constitutional:      General: She is not in acute distress.    Appearance: Normal appearance. She is not ill-appearing.  HENT:     Head: Normocephalic and atraumatic.     Right Ear: Tympanic membrane, ear canal and external ear normal.     Left Ear: Tympanic membrane, ear canal and external ear normal.  Eyes:     Extraocular Movements: Extraocular movements intact.     Pupils: Pupils are equal, round, and reactive to light.  Cardiovascular:     Rate and Rhythm: Normal rate and regular rhythm.     Heart sounds: Normal heart sounds. No murmur heard.    No gallop.  Pulmonary:     Effort: Pulmonary effort is normal. No respiratory distress.     Breath sounds: Normal breath sounds. No wheezing or rales.  Abdominal:     General: Bowel sounds are normal. There is no distension.     Palpations: Abdomen is soft.     Tenderness: There is no abdominal tenderness. There is no guarding.  Skin:    General: Skin is warm and dry.  Neurological:     Mental Status: She is alert and oriented to person, place, and time.  Psychiatric:        Judgment: Judgment normal.    Diabetic Foot Exam - Simple   Simple Foot Form  06/17/2022  8:22 AM  Visual Inspection No deformities, no ulcerations, no other skin breakdown bilaterally: Yes Sensation Testing See comments: Yes Pulse Check Posterior Tibialis and Dorsalis pulse intact bilaterally: Yes Comments Unable to feel moofilament-- baseline for pt       BP 120/68 (BP Location: Left Arm, Patient  Position: Sitting, Cuff Size: Large)   Pulse 63   Temp 97.8 F (36.6 C) (Oral)   Resp 18   Ht '5\' 4"'$  (1.626 m)   Wt 245 lb 6.4 oz (111.3 kg)   SpO2 95%   BMI 42.12 kg/m  Wt Readings from Last 3 Encounters:  06/17/22 245 lb 6.4 oz (111.3 kg)  01/18/22 241 lb 9.6 oz (109.6 kg)  11/30/21 244 lb (110.7 kg)    Diabetic Foot Exam - Simple   Simple Foot Form  06/17/2022  8:22 AM  Visual Inspection No deformities, no ulcerations, no other skin breakdown bilaterally: Yes Sensation Testing See comments: Yes Pulse Check Posterior Tibialis and Dorsalis pulse intact bilaterally: Yes Comments Unable to feel moofilament-- baseline for pt     Lab Results  Component Value Date   WBC 8.5 04/20/2021   HGB 13.9 04/20/2021   HCT 41.8 04/20/2021   PLT 145.0 (L) 04/20/2021   GLUCOSE 93 11/30/2021   CHOL 121 11/30/2021   TRIG 100.0 11/30/2021   HDL 49.80 11/30/2021   LDLCALC 51 11/30/2021   ALT 15 11/30/2021   AST 21 11/30/2021   NA 142 11/30/2021   K 4.4 11/30/2021   CL 103 11/30/2021   CREATININE 0.95 11/30/2021   BUN 17 11/30/2021   CO2 33 (H) 11/30/2021   TSH 1.97 04/20/2021   HGBA1C 6.0 11/30/2021   MICROALBUR <0.7 11/30/2021    Lab Results  Component Value Date   TSH 1.97 04/20/2021   Lab Results  Component Value Date   WBC 8.5 04/20/2021   HGB 13.9 04/20/2021  HCT 41.8 04/20/2021   MCV 92.8 04/20/2021   PLT 145.0 (L) 04/20/2021   Lab Results  Component Value Date   NA 142 11/30/2021   K 4.4 11/30/2021   CO2 33 (H) 11/30/2021   GLUCOSE 93 11/30/2021   BUN 17 11/30/2021   CREATININE 0.95 11/30/2021   BILITOT 0.6 11/30/2021   ALKPHOS 71 11/30/2021   AST 21 11/30/2021   ALT 15 11/30/2021   PROT 6.8 11/30/2021   ALBUMIN 4.0 11/30/2021   CALCIUM 10.0 11/30/2021   GFR 61.66 11/30/2021   Lab Results  Component Value Date   CHOL 121 11/30/2021   Lab Results  Component Value Date   HDL 49.80 11/30/2021   Lab Results  Component Value Date   LDLCALC 51  11/30/2021   Lab Results  Component Value Date   TRIG 100.0 11/30/2021   Lab Results  Component Value Date   CHOLHDL 2 11/30/2021   Lab Results  Component Value Date   HGBA1C 6.0 11/30/2021       Assessment & Plan:   Problem List Items Addressed This Visit       Unprioritized   Essential hypertension   Relevant Orders   CBC with Differential/Platelet   Comprehensive metabolic panel   Microalbumin / creatinine urine ratio   Type 2 diabetes mellitus with diabetic peripheral angiopathy without gangrene, without long-term current use of insulin ([place])    hgba1c to be checked , minimize simple carbs. Increase exercise as tolerated. Continue current meds       Hyperlipidemia associated with type 2 diabetes mellitus ([place])    Encourage heart healthy diet such as MIND or DASH diet, increase exercise, avoid trans fats, simple carbohydrates and processed foods, consider a krill or fish or flaxseed oil cap daily.       Relevant Orders   CBC with Differential/Platelet   Comprehensive metabolic panel   Lipid panel   Microalbumin / creatinine urine ratio   Other Visit Diagnoses     Type 2 diabetes mellitus with hyperglycemia, without long-term current use of insulin ([facility])    -  Primary   Relevant Orders   CBC with Differential/Platelet   Comprehensive metabolic panel   Hemoglobin A1c   Microalbumin / creatinine urine ratio   Preventative health care       Relevant Orders   CBC with Differential/Platelet   Comprehensive metabolic panel   Hemoglobin A1c   Lipid panel   Microalbumin / creatinine urine ratio   Need for pneumococcal 20-valent conjugate vaccination       Relevant Orders   Pneumococcal conjugate vaccine 20-valent (Prevnar 20) (Completed)   Estrogen deficiency       Relevant Orders   DG Bone Density   Other migraine without status migrainosus, not intractable       Relevant Medications   Rimegepant Sulfate (NURTEC) 75 MG TBDP      Meds ordered this  encounter  Medications   Rimegepant Sulfate (NURTEC) 75 MG TBDP    Sig: 1 po qd prn migraine    Dispense:  30 tablet    Refill:  1    I, [name], DO, personally preformed the services described in this documentation.  All medical record entries made by the scribe were at my direction and in my presence.  I have reviewed the chart and discharge instructions (if applicable) and agree that the record reflects my personal performance and is accurate and complete. 06/17/2022   I,Amber Collins,acting as a  scribe for [name], DO.,have documented all relevant documentation on the behalf of [name], DO,as directed by  [name], DO while in the presence of [name], DO.    [name], DO

## 2022-06-17 NOTE — Patient Instructions (Signed)
Preventive Care 65 Years and Older, Female Preventive care refers to lifestyle choices and visits with your health care provider that can promote health and wellness. Preventive care visits are also called wellness exams. What can I expect for my preventive care visit? Counseling Your health care provider may ask you questions about your: Medical history, including: Past medical problems. Family medical history. Pregnancy and menstrual history. History of falls. Current health, including: Memory and ability to understand (cognition). Emotional well-being. Home life and relationship well-being. Sexual activity and sexual health. Lifestyle, including: Alcohol, nicotine or tobacco, and drug use. Access to firearms. Diet, exercise, and sleep habits. Work and work environment. Sunscreen use. Safety issues such as seatbelt and bike helmet use. Physical exam Your health care provider will check your: Height and weight. These may be used to calculate your BMI (body mass index). BMI is a measurement that tells if you are at a healthy weight. Waist circumference. This measures the distance around your waistline. This measurement also tells if you are at a healthy weight and may help predict your risk of certain diseases, such as type 2 diabetes and high blood pressure. Heart rate and blood pressure. Body temperature. Skin for abnormal spots. What immunizations do I need?  Vaccines are usually given at various ages, according to a schedule. Your health care provider will recommend vaccines for you based on your age, medical history, and lifestyle or other factors, such as travel or where you work. What tests do I need? Screening Your health care provider may recommend screening tests for certain conditions. This may include: Lipid and cholesterol levels. Hepatitis C test. Hepatitis B test. HIV (human immunodeficiency virus) test. STI (sexually transmitted infection) testing, if you are at  risk. Lung cancer screening. Colorectal cancer screening. Diabetes screening. This is done by checking your blood sugar (glucose) after you have not eaten for a while (fasting). Mammogram. Talk with your health care provider about how often you should have regular mammograms. BRCA-related cancer screening. This may be done if you have a family history of breast, ovarian, tubal, or peritoneal cancers. Bone density scan. This is done to screen for osteoporosis. Talk with your health care provider about your test results, treatment options, and if necessary, the need for more tests. Follow these instructions at home: Eating and drinking  Eat a diet that includes fresh fruits and vegetables, whole grains, lean protein, and low-fat dairy products. Limit your intake of foods with high amounts of sugar, saturated fats, and salt. Take vitamin and mineral supplements as recommended by your health care provider. Do not drink alcohol if your health care provider tells you not to drink. If you drink alcohol: Limit how much you have to 0-1 drink a day. Know how much alcohol is in your drink. In the [place] one drink equals one 12 oz bottle of beer (355 mL), one 5 oz glass of wine (148 mL), or one 1 oz glass of hard liquor (44 mL). Lifestyle Brush your teeth every morning and night with fluoride toothpaste. Floss one time each day. Exercise for at least 30 minutes 5 or more days each week. Do not use any products that contain nicotine or tobacco. These products include cigarettes, chewing tobacco, and vaping devices, such as e-cigarettes. If you need help quitting, ask your health care provider. Do not use drugs. If you are sexually active, practice safe sex. Use a condom or other form of protection in order to prevent STIs. Take aspirin only as told by   your health care provider. Make sure that you understand how much to take and what form to take. Work with your health care provider to find out whether it  is safe and beneficial for you to take aspirin daily. Ask your health care provider if you need to take a cholesterol-lowering medicine (statin). Find healthy ways to manage stress, such as: Meditation, yoga, or listening to music. Journaling. Talking to a trusted person. Spending time with friends and family. Minimize exposure to UV radiation to reduce your risk of skin cancer. Safety Always wear your seat belt while driving or riding in a vehicle. Do not drive: If you have been drinking alcohol. Do not ride with someone who has been drinking. When you are tired or distracted. While texting. If you have been using any mind-altering substances or drugs. Wear a helmet and other protective equipment during sports activities. If you have firearms in your house, make sure you follow all gun safety procedures. What's next? Visit your health care provider once a year for an annual wellness visit. Ask your health care provider how often you should have your eyes and teeth checked. Stay up to date on all vaccines. This information is not intended to replace advice given to you by your health care provider. Make sure you discuss any questions you have with your health care provider. Document Revised: 04/18/2021 Document Reviewed: 04/18/2021 Elsevier Patient Education  2023 [employer]vier Inc.  

## 2022-06-22 ENCOUNTER — Other Ambulatory Visit: Payer: Self-pay | Admitting: Family Medicine

## 2022-06-24 ENCOUNTER — Encounter: Payer: Self-pay | Admitting: Family Medicine

## 2022-06-27 ENCOUNTER — Encounter: Payer: Self-pay | Admitting: Family Medicine

## 2022-06-28 MED ORDER — OZEMPIC (1 MG/DOSE) 4 MG/3ML ~~LOC~~ SOPN: 1.0000 mg | PEN_INJECTOR | SUBCUTANEOUS | 1 refills | Status: DC

## 2022-07-09 ENCOUNTER — Other Ambulatory Visit (HOSPITAL_BASED_OUTPATIENT_CLINIC_OR_DEPARTMENT_OTHER): Payer: 59

## 2022-08-01 LAB — HM MAMMOGRAPHY

## 2022-08-05 ENCOUNTER — Encounter: Payer: Self-pay | Admitting: Family Medicine

## 2022-08-28 ENCOUNTER — Telehealth: Payer: 59

## 2022-08-28 ENCOUNTER — Encounter: Payer: Self-pay | Admitting: Family Medicine

## 2022-08-28 NOTE — Telephone Encounter (Signed)
PA initiated via Covermymeds; KEY: [id number].   Per plan- [facility] has indicated that it is too soon to refill this medication at the pharmacy for your patient. If you need to renew an existing PA for your patient's medication, please reach out to [place] directly at [phone]

## 2022-08-28 NOTE — Telephone Encounter (Signed)
Spoke w/ Yudi at [employer] [phone])- PA is not needed, she also checked starting in 2024, and system is still showing a paid claim.

## 2022-10-14 ENCOUNTER — Other Ambulatory Visit: Payer: Self-pay | Admitting: Family Medicine

## 2022-10-14 ENCOUNTER — Encounter: Payer: Self-pay | Admitting: Family Medicine

## 2022-10-14 DIAGNOSIS — I10 Essential (primary) hypertension: Secondary | ICD-10-CM

## 2022-10-14 DIAGNOSIS — E1169 Type 2 diabetes mellitus with other specified complication: Secondary | ICD-10-CM

## 2022-10-14 DIAGNOSIS — E785 Hyperlipidemia, unspecified: Secondary | ICD-10-CM

## 2022-10-14 DIAGNOSIS — E1165 Type 2 diabetes mellitus with hyperglycemia: Secondary | ICD-10-CM

## 2022-10-14 MED ORDER — OZEMPIC (1 MG/DOSE) 4 MG/3ML ~~LOC~~ SOPN: 1.0000 mg | PEN_INJECTOR | SUBCUTANEOUS | 0 refills | Status: DC

## 2022-12-19 ENCOUNTER — Encounter: Payer: Self-pay | Admitting: Family Medicine

## 2022-12-19 ENCOUNTER — Ambulatory Visit (INDEPENDENT_AMBULATORY_CARE_PROVIDER_SITE_OTHER): Payer: 59 | Admitting: Family Medicine

## 2022-12-19 VITALS — BP 110/70 | HR 59 | Temp 97.8°F | Resp 18 | Ht 64.0 in | Wt 238.8 lb

## 2022-12-19 DIAGNOSIS — E785 Hyperlipidemia, unspecified: Secondary | ICD-10-CM

## 2022-12-19 DIAGNOSIS — E1151 Type 2 diabetes mellitus with diabetic peripheral angiopathy without gangrene: Secondary | ICD-10-CM

## 2022-12-19 DIAGNOSIS — I10 Essential (primary) hypertension: Secondary | ICD-10-CM

## 2022-12-19 DIAGNOSIS — E1169 Type 2 diabetes mellitus with other specified complication: Secondary | ICD-10-CM | POA: Diagnosis not present

## 2022-12-19 DIAGNOSIS — E1165 Type 2 diabetes mellitus with hyperglycemia: Secondary | ICD-10-CM

## 2022-12-19 LAB — MICROALBUMIN / CREATININE URINE RATIO
Creatinine,U: 121.3 mg/dL
Microalb Creat Ratio: 0.6 mg/g (ref 0.0–30.0)
Microalb, Ur: <0.7 mg/dL (ref 0.0–1.9)

## 2022-12-19 LAB — CBC WITH DIFFERENTIAL/PLATELET
Basophils Absolute: 0 10*3/uL (ref 0.0–0.1)
Basophils Relative: 0.5 % (ref 0.0–3.0)
Eosinophils Absolute: 0.3 10*3/uL (ref 0.0–0.7)
Eosinophils Relative: 3 % (ref 0.0–5.0)
HCT: 43.6 % (ref 36.0–46.0)
Hemoglobin: 14.4 g/dL (ref 12.0–15.0)
Lymphocytes Relative: 21.1 % (ref 12.0–46.0)
Lymphs Abs: 1.9 10*3/uL (ref 0.7–4.0)
MCHC: 33 g/dL (ref 30.0–36.0)
MCV: 92.9 fl (ref 78.0–100.0)
Monocytes Absolute: 0.7 10*3/uL (ref 0.1–1.0)
Monocytes Relative: 7.9 % (ref 3.0–12.0)
Neutro Abs: 6.1 10*3/uL (ref 1.4–7.7)
Neutrophils Relative %: 67.5 % (ref 43.0–77.0)
Platelets: 177 10*3/uL (ref 150.0–400.0)
RBC: 4.69 Mil/uL (ref 3.87–5.11)
RDW: 13.2 % (ref 11.5–15.5)
WBC: 9 10*3/uL (ref 4.0–10.5)

## 2022-12-19 LAB — COMPREHENSIVE METABOLIC PANEL
ALT: 17 U/L (ref 0–35)
AST: 19 U/L (ref 0–37)
Albumin: 3.9 g/dL (ref 3.5–5.2)
Alkaline Phosphatase: 65 U/L (ref 39–117)
BUN: 19 mg/dL (ref 6–23)
CO2: 32 mEq/L (ref 19–32)
Calcium: 9.6 mg/dL (ref 8.4–10.5)
Chloride: 103 mEq/L (ref 96–112)
Creatinine, Ser: 0.95 mg/dL (ref 0.40–1.20)
GFR: 61.21 mL/min (ref >60.00)
Glucose, Bld: 93 mg/dL (ref 70–99)
Potassium: 4.8 mEq/L (ref 3.5–5.1)
Sodium: 142 mEq/L (ref 135–145)
Total Bilirubin: 0.6 mg/dL (ref 0.2–1.2)
Total Protein: 6.9 g/dL (ref 6.0–8.3)

## 2022-12-19 LAB — HEMOGLOBIN A1C: Hgb A1c MFr Bld: 6 % (ref 4.6–6.5)

## 2022-12-19 LAB — LIPID PANEL
Cholesterol: 122 mg/dL (ref 0–200)
HDL: 48.3 mg/dL (ref >39.00)
LDL Cholesterol: 52 mg/dL (ref 0–99)
NonHDL: 73.81
Total CHOL/HDL Ratio: 3
Triglycerides: 108 mg/dL (ref 0.0–149.0)
VLDL: 21.6 mg/dL (ref 0.0–40.0)

## 2022-12-19 MED ORDER — OZEMPIC (1 MG/DOSE) 4 MG/3ML ~~LOC~~ SOPN: 1.0000 mg | PEN_INJECTOR | SUBCUTANEOUS | 2 refills | Status: DC

## 2022-12-19 NOTE — Assessment & Plan Note (Signed)
hgba1c to be checked, minimize simple carbs. Increase exercise as tolerated. Continue current meds  

## 2022-12-19 NOTE — Assessment & Plan Note (Signed)
Well controlled, no changes to meds. Encouraged heart healthy diet such as the DASH diet and exercise as tolerated.  

## 2022-12-19 NOTE — Assessment & Plan Note (Signed)
Hgba1c to be checked , minimize simple carbs. Increase exercise as tolerated. Continue current meds

## 2022-12-19 NOTE — Patient Instructions (Signed)

## 2022-12-19 NOTE — Assessment & Plan Note (Signed)
Tolerating statin, encouraged heart healthy diet, avoid trans fats, minimize simple carbs and saturated fats. Increase exercise as tolerated 

## 2022-12-19 NOTE — Progress Notes (Signed)
Subjective:   By signing my name below, I, Shehryar Baig, attest that this documentation has been prepared under the direction and in the presence of [name], DO. 12/19/2022   Patient ID: Jacqueline Duran, female    DOB: [birth year], 72 y.o.   MRN: [id number]  Chief Complaint  Patient presents with   Hypertension   Hyperlipidemia   Diabetes   Follow-up    Hypertension Pertinent negatives include no blurred vision, chest pain, headaches, malaise/fatigue, palpitations or shortness of breath.  Hyperlipidemia Pertinent negatives include no chest pain or shortness of breath.  Diabetes Pertinent negatives for hypoglycemia include no dizziness, headaches or nervousness/anxiousness. Pertinent negatives for diabetes include no blurred vision and no chest pain.   Patient is in today for a office visit.   She continues taking ozempic at this time and prefers to continues taking it over mounjaro. She stopped taking 1 mg glimepiride due to feeling shaky while taking it. She never measured her blood sugar during these episodes of shakiness.  She has an upcomming vision care appointment. She is due for a colonoscopy.     Past Medical History:  Diagnosis Date   Colon polyps    Deafness    Right Ear   Diabetes mellitus    Hyperlipidemia    Hypertension     Past Surgical History:  Procedure Laterality Date   ABDOMINAL HYSTERECTOMY     CERVICAL FUSION  07/2004   COLONOSCOPY  8/11   Hyperplastic polyps   TONSILLECTOMY      Family History  Problem Relation Age of Onset   Coronary artery disease Mother    GI Bleed Mother    Cancer Father        LUNG   Lung cancer Father    Clotting disorder Sister        Factor V   Factor V Leiden deficiency Sister    Ankylosing spondylitis Sister    Factor V Leiden deficiency Brother    Leukemia Maternal Uncle    Heart attack Other    Alcohol abuse Other    Stroke Other        Female <50/Female <60   Heart disease Other     Factor V Leiden deficiency Other        THROMBOEBOLISM CLOTTING DISORDER   Alcohol abuse Other     Social History   Socioeconomic History   Marital status: Widowed    Spouse name: Not on file   Number of children: Not on file   Years of education: Not on file   Highest education level: Not on file  Occupational History   Occupation: unemployed  Tobacco Use   Smoking status: Never   Smokeless tobacco: Never  Substance and Sexual Activity   Alcohol use: No   Drug use: No   Sexual activity: Not Currently    Partners: Male  Other Topics Concern   Not on file  Social History Narrative   Live at home alone   Social Determinants of Health   Financial Resource Strain: Not on file  Food Insecurity: Not on file  Transportation Needs: Not on file  Physical Activity: Not on file  Stress: Not on file  Social Connections: Not on file  Intimate Partner Violence: Not on file    Outpatient Medications Prior to Visit  Medication Sig Dispense Refill   ascorbic acid (VITAMIN C) 500 MG tablet Take 1,000 mg by mouth daily.      aspirin 81 MG  tablet Take 81 mg by mouth daily.       atorvastatin (LIPITOR) 10 MG tablet Take 1 tablet (10 mg total) by mouth daily. 90 tablet 1   dapagliflozin propanediol (FARXIGA) 10 MG TABS tablet Take 1 tablet (10 mg total) by mouth daily. 90 tablet 1   escitalopram (LEXAPRO) 20 MG tablet Take 1 tablet (20 mg total) by mouth daily. 90 tablet 3   fenofibrate 160 MG tablet Take 1 tablet (160 mg total) by mouth daily. 90 tablet 1   folic acid (FOLVITE) 1 MG tablet Take 1 tablet (1 mg total) by mouth daily. 90 tablet 3   glimepiride (AMARYL) 1 MG tablet Take 1 tablet (1 mg total) by mouth daily. 90 tablet 1   glucose blood (ONE TOUCH ULTRA TEST) test strip Check blood sugar once daily. Dx:E11.9 100 each 5   Insulin Pen Needle (BD PEN NEEDLE MICRO U/F) 32G X 6 MM MISC To use w/ Ozempic 100 each 3   Magnesium 500 MG TABS Take 1 tablet by mouth daily.     Multiple  Vitamin (MULTIVITAMIN) tablet Take 1 tablet by mouth daily.       ONETOUCH DELICA LANCETS FINE MISC Check blood sugar once daily. Dx:E11.9 100 each 5   Rimegepant Sulfate (NURTEC) 75 MG TBDP 1 po qd prn migraine 30 tablet 1   valsartan-hydrochlorothiazide (DIOVAN-HCT) 160-12.5 MG tablet Take 1 tablet by mouth daily. 90 tablet 1   VITAMIN E PO Take by mouth.     Semaglutide, 1 MG/DOSE, (OZEMPIC, 1 MG/DOSE,) 4 MG/3ML SOPN Inject 1 mg into the skin once a week. 9 mL 0   No facility-administered medications prior to visit.    Allergies  Allergen Reactions   Amlodipine Besylate     REACTION: swelling in feet and ankles/rash/pain   Other Itching    INVOKANA    Review of Systems  Constitutional:  Negative for fever and malaise/fatigue.  HENT:  Negative for congestion.   Eyes:  Negative for blurred vision.  Respiratory:  Negative for shortness of breath.   Cardiovascular:  Negative for chest pain, palpitations and leg swelling.  Gastrointestinal:  Negative for abdominal pain, blood in stool and nausea.  Genitourinary:  Negative for dysuria and frequency.  Musculoskeletal:  Negative for falls.  Skin:  Negative for rash.  Neurological:  Negative for dizziness, loss of consciousness and headaches.  Endo/Heme/Allergies:  Negative for environmental allergies.  Psychiatric/Behavioral:  Negative for depression. The patient is not nervous/anxious.        Objective:    Physical Exam Vitals and nursing note reviewed.  Constitutional:      General: She is not in acute distress.    Appearance: Normal appearance. She is not ill-appearing.  HENT:     Head: Normocephalic and atraumatic.     Right Ear: External ear normal.     Left Ear: External ear normal.  Eyes:     Extraocular Movements: Extraocular movements intact.     Pupils: Pupils are equal, round, and reactive to light.  Cardiovascular:     Rate and Rhythm: Normal rate and regular rhythm.     Heart sounds: Normal heart sounds. No  murmur heard.    No gallop.  Pulmonary:     Effort: Pulmonary effort is normal. No respiratory distress.     Breath sounds: Normal breath sounds. No wheezing or rales.  Skin:    General: Skin is warm and dry.  Neurological:     Mental Status: She is  alert and oriented to person, place, and time.  Psychiatric:        Judgment: Judgment normal.    Diabetic Foot Exam - Simple   Simple Foot Form Diabetic Foot exam was performed with the following findings: Yes 12/19/2022 12:25 PM  Visual Inspection No deformities, no ulcerations, no other skin breakdown bilaterally: Yes Sensation Testing See comments: Yes Pulse Check Posterior Tibialis and Dorsalis pulse intact bilaterally: Yes Comments Pt has no feeling in her feet        BP 110/70 (BP Location: Left Arm, Patient Position: Sitting, Cuff Size: Large)   Pulse (!) 59   Temp 97.8 F (36.6 C) (Oral)   Resp 18   Ht 5' 4"$  (1.626 m)   Wt 238 lb 12.8 oz (108.3 kg)   SpO2 96%   BMI 40.99 kg/m  Wt Readings from Last 3 Encounters:  12/19/22 238 lb 12.8 oz (108.3 kg)  06/17/22 245 lb 6.4 oz (111.3 kg)  01/18/22 241 lb 9.6 oz (109.6 kg)       Assessment & Plan:  Type 2 diabetes mellitus with hyperglycemia, without long-term current use of insulin (HCC) -     Lipid panel -     CBC with Differential/Platelet -     Comprehensive metabolic panel -     Hemoglobin A1c -     Microalbumin / creatinine urine ratio -     Ozempic (1 MG/DOSE); Inject 1 mg into the skin once a week.  Dispense: 9 mL; Refill: 2  Hyperlipidemia associated with type 2 diabetes mellitus ([place]) Assessment & Plan: Tolerating statin, encouraged heart healthy diet, avoid trans fats, minimize simple carbs and saturated fats. Increase exercise as tolerated   Orders: -     Lipid panel -     CBC with Differential/Platelet -     Comprehensive metabolic panel -     Hemoglobin A1c -     Microalbumin / creatinine urine ratio  Essential hypertension Assessment &  Plan: Well controlled, no changes to meds. Encouraged heart healthy diet such as the DASH diet and exercise as tolerated.    Orders: -     Lipid panel -     CBC with Differential/Platelet -     Comprehensive metabolic panel -     Hemoglobin A1c -     Microalbumin / creatinine urine ratio  Type 2 diabetes mellitus with diabetic peripheral angiopathy without gangrene, without long-term current use of insulin (HCC) Assessment & Plan: Hgba1c to be checked , minimize simple carbs. Increase exercise as tolerated. Continue current meds      I, [name], DO, personally preformed the services described in this documentation.  All medical record entries made by the scribe were at my direction and in my presence.  I have reviewed the chart and discharge instructions (if applicable) and agree that the record reflects my personal performance and is accurate and complete. 12/19/2022   I,Shehryar Baig,acting as a scribe for [name], DO.,have documented all relevant documentation on the behalf of [name], DO,as directed by  [name], DO while in the presence of [name], DO.   [name], DO

## 2023-01-13 ENCOUNTER — Other Ambulatory Visit: Payer: Self-pay | Admitting: Family Medicine

## 2023-01-13 DIAGNOSIS — E1140 Type 2 diabetes mellitus with diabetic neuropathy, unspecified: Secondary | ICD-10-CM

## 2023-01-14 ENCOUNTER — Other Ambulatory Visit: Payer: Self-pay | Admitting: Family Medicine

## 2023-01-14 DIAGNOSIS — R7989 Other specified abnormal findings of blood chemistry: Secondary | ICD-10-CM

## 2023-01-14 DIAGNOSIS — F411 Generalized anxiety disorder: Secondary | ICD-10-CM

## 2023-06-02 ENCOUNTER — Other Ambulatory Visit: Payer: Self-pay | Admitting: Family Medicine

## 2023-06-02 DIAGNOSIS — I10 Essential (primary) hypertension: Secondary | ICD-10-CM

## 2023-06-02 DIAGNOSIS — E785 Hyperlipidemia, unspecified: Secondary | ICD-10-CM

## 2023-06-02 DIAGNOSIS — E1169 Type 2 diabetes mellitus with other specified complication: Secondary | ICD-10-CM

## 2023-06-04 ENCOUNTER — Encounter (INDEPENDENT_AMBULATORY_CARE_PROVIDER_SITE_OTHER): Payer: Self-pay

## 2023-06-05 ENCOUNTER — Other Ambulatory Visit: Payer: Self-pay | Admitting: Oncology

## 2023-06-05 DIAGNOSIS — Z006 Encounter for examination for normal comparison and control in clinical research program: Secondary | ICD-10-CM

## 2023-06-12 ENCOUNTER — Other Ambulatory Visit (HOSPITAL_BASED_OUTPATIENT_CLINIC_OR_DEPARTMENT_OTHER): Payer: 59

## 2023-06-16 LAB — HM DIABETES EYE EXAM

## 2023-06-17 ENCOUNTER — Encounter: Payer: Self-pay | Admitting: Family Medicine

## 2023-06-17 ENCOUNTER — Ambulatory Visit (HOSPITAL_BASED_OUTPATIENT_CLINIC_OR_DEPARTMENT_OTHER)
Admission: RE | Admit: 2023-06-17 | Discharge: 2023-06-17 | Disposition: A | Discharge status: Home / Self Care | Payer: 59 | Source: Ambulatory Visit | Attending: Family Medicine | Admitting: Family Medicine

## 2023-06-17 DIAGNOSIS — E2839 Other primary ovarian failure: Secondary | ICD-10-CM | POA: Insufficient documentation

## 2023-06-19 ENCOUNTER — Other Ambulatory Visit (HOSPITAL_COMMUNITY)
Admission: AD | Admit: 2023-06-19 | Discharge: 2023-06-19 | Disposition: A | Discharge status: Home / Self Care | Payer: Medicare Other | Attending: Oncology | Admitting: Oncology

## 2023-06-19 DIAGNOSIS — Z006 Encounter for examination for normal comparison and control in clinical research program: Secondary | ICD-10-CM

## 2023-06-26 ENCOUNTER — Encounter: Payer: Self-pay | Admitting: Family Medicine

## 2023-06-26 ENCOUNTER — Ambulatory Visit (INDEPENDENT_AMBULATORY_CARE_PROVIDER_SITE_OTHER): Payer: 59 | Admitting: Family Medicine

## 2023-06-26 ENCOUNTER — Other Ambulatory Visit: Payer: Self-pay | Admitting: Medical Genetics

## 2023-06-26 VITALS — BP 128/80 | HR 62 | Temp 98.4°F | Resp 18 | Ht 64.0 in | Wt 247.8 lb

## 2023-06-26 DIAGNOSIS — Z7984 Long term (current) use of oral hypoglycemic drugs: Secondary | ICD-10-CM

## 2023-06-26 DIAGNOSIS — Z0000 Encounter for general adult medical examination without abnormal findings: Secondary | ICD-10-CM | POA: Diagnosis not present

## 2023-06-26 DIAGNOSIS — E1169 Type 2 diabetes mellitus with other specified complication: Secondary | ICD-10-CM

## 2023-06-26 DIAGNOSIS — I10 Essential (primary) hypertension: Secondary | ICD-10-CM | POA: Diagnosis not present

## 2023-06-26 DIAGNOSIS — Z1211 Encounter for screening for malignant neoplasm of colon: Secondary | ICD-10-CM

## 2023-06-26 DIAGNOSIS — E1140 Type 2 diabetes mellitus with diabetic neuropathy, unspecified: Secondary | ICD-10-CM | POA: Diagnosis not present

## 2023-06-26 DIAGNOSIS — Z006 Encounter for examination for normal comparison and control in clinical research program: Secondary | ICD-10-CM

## 2023-06-26 DIAGNOSIS — E1165 Type 2 diabetes mellitus with hyperglycemia: Secondary | ICD-10-CM

## 2023-06-26 DIAGNOSIS — E785 Hyperlipidemia, unspecified: Secondary | ICD-10-CM

## 2023-06-26 LAB — LIPID PANEL
Cholesterol: 124 mg/dL (ref 0–200)
HDL: 48.6 mg/dL (ref >39.00)
LDL Cholesterol: 55 mg/dL (ref 0–99)
NonHDL: 75.39
Total CHOL/HDL Ratio: 3
Triglycerides: 100 mg/dL (ref 0.0–149.0)
VLDL: 20 mg/dL (ref 0.0–40.0)

## 2023-06-26 LAB — CBC WITH DIFFERENTIAL/PLATELET
Basophils Absolute: 0 10*3/uL (ref 0.0–0.1)
Basophils Relative: 0.5 % (ref 0.0–3.0)
Eosinophils Absolute: 0.3 10*3/uL (ref 0.0–0.7)
Eosinophils Relative: 3.4 % (ref 0.0–5.0)
HCT: 41.9 % (ref 36.0–46.0)
Hemoglobin: 13.4 g/dL (ref 12.0–15.0)
Lymphocytes Relative: 19.1 % (ref 12.0–46.0)
Lymphs Abs: 1.8 10*3/uL (ref 0.7–4.0)
MCHC: 32.1 g/dL (ref 30.0–36.0)
MCV: 94 fl (ref 78.0–100.0)
Monocytes Absolute: 0.6 10*3/uL (ref 0.1–1.0)
Monocytes Relative: 6.9 % (ref 3.0–12.0)
Neutro Abs: 6.6 10*3/uL (ref 1.4–7.7)
Neutrophils Relative %: 70.1 % (ref 43.0–77.0)
Platelets: 190 10*3/uL (ref 150.0–400.0)
RBC: 4.45 Mil/uL (ref 3.87–5.11)
RDW: 13.5 % (ref 11.5–15.5)
WBC: 9.4 10*3/uL (ref 4.0–10.5)

## 2023-06-26 LAB — COMPREHENSIVE METABOLIC PANEL
ALT: 16 U/L (ref 0–35)
AST: 19 U/L (ref 0–37)
Albumin: 3.9 g/dL (ref 3.5–5.2)
Alkaline Phosphatase: 71 U/L (ref 39–117)
BUN: 16 mg/dL (ref 6–23)
CO2: 31 mEq/L (ref 19–32)
Calcium: 9.8 mg/dL (ref 8.4–10.5)
Chloride: 104 mEq/L (ref 96–112)
Creatinine, Ser: 0.92 mg/dL (ref 0.40–1.20)
GFR: 63.38 mL/min (ref >60.00)
Glucose, Bld: 116 mg/dL — ABNORMAL HIGH (ref 70–99)
Potassium: 4.3 mEq/L (ref 3.5–5.1)
Sodium: 141 mEq/L (ref 135–145)
Total Bilirubin: 0.5 mg/dL (ref 0.2–1.2)
Total Protein: 6.7 g/dL (ref 6.0–8.3)

## 2023-06-26 LAB — MICROALBUMIN / CREATININE URINE RATIO
Creatinine,U: 116.3 mg/dL
Microalb Creat Ratio: 0.6 mg/g (ref 0.0–30.0)
Microalb, Ur: <0.7 mg/dL (ref 0.0–1.9)

## 2023-06-26 LAB — HEMOGLOBIN A1C: Hgb A1c MFr Bld: 6.4 % (ref 4.6–6.5)

## 2023-06-26 MED ORDER — DAPAGLIFLOZIN PROPANEDIOL 10 MG PO TABS
10.0000 mg | ORAL_TABLET | Freq: Every day | ORAL | 1 refills | Status: DC
Start: 2023-06-26 — End: 2024-03-01

## 2023-06-26 MED ORDER — GLIMEPIRIDE 1 MG PO TABS
1.0000 mg | ORAL_TABLET | Freq: Every day | ORAL | 1 refills | Status: DC
Start: 2023-06-26 — End: 2024-03-01

## 2023-06-26 NOTE — Progress Notes (Signed)
Established Patient Office Visit  Subjective   Patient ID: Jacqueline Duran, female    DOB: 1951-05-14  Age: 72 y.o. MRN: [id number]  Chief Complaint  Patient presents with   Annual Exam    Pt states fasting     HPI Discussed the use of AI scribe software for clinical note transcription with the patient, who gave verbal consent to proceed.  History of Present Illness   The patient, with a history of osteoporosis and hyperlipidemia, presents for a routine physical. She reports discomfort in her right arm, describing it as localized from the shoulder to the elbow. The patient notes that certain movements exacerbate the discomfort. She has been managing the discomfort with over-the-counter ibuprofen. The patient also mentions a recent bone scan, which revealed degenerative changes in the spine. She has not noticed any significant impact on her daily activities due to these changes. The patient is currently participating in a DNA study through her healthcare provider. She is also on a regimen of Lipitor, Farxiga, Lexapro, fenofibrate, folic acid, glimepiride, and Ozempic. The patient does not regularly test her blood sugar levels.      Patient Active Problem List   Diagnosis Date Noted   Colon cancer screening 06/26/2023   Acute non-recurrent pansinusitis 01/18/2022   Type 2 diabetes mellitus with diabetic peripheral angiopathy without gangrene, without long-term current use of insulin (HCC) 11/30/2021   Diabetic ulcer of left foot associated with type 2 diabetes mellitus, unspecified part of foot, unspecified ulcer stage (HCC) 04/20/2021   Exposure to COVID-19 virus 03/24/2019   Drug-induced acute pancreatitis 04/28/2017   Blister of left foot 12/16/2013   Obesity (BMI 30-39.9) 07/08/2013   Chest pain 06/04/2012   NAUSEA 09/25/2010   ABNORMAL EXAM-BILIARY TRACT 09/25/2010   PERSONAL HX COLONIC POLYPS 09/25/2010   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  in adult, unspecified BMI (HCC)    RUQ pain 09/05/2010   Hyperlipidemia 05/14/2010   POSTMENOPAUSAL STATUS 05/14/2010   Type 2 diabetes mellitus with diabetic neuropathy, without long-term current use of insulin (HCC) 02/06/2010   Anxiety state 03/14/2009   SINUSITIS - ACUTE-NOS 01/16/2009   EDEMA 10/20/2008   HEEL PAIN, RIGHT 08/26/2008   ACUTE FRONTAL SINUSITIS 12/11/2007   ACUTE BRONCHITIS 11/24/2007   CARCINOMA, BASAL CELL, BACK 04/07/2007   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   LEUKOCYTOSIS NOS 04/07/2007   DISORDER, DEPRESSIVE NEC 04/07/2007   LOSS, HEARING NOS 04/07/2007   Primary hypertension 04/03/2007   ECZEMA 04/03/2007   HEADACHE 04/03/2007   Past Medical History:  Diagnosis Date   Colon polyps    Deafness    Right Ear   Diabetes mellitus    Hyperlipidemia    Hypertension    Past Surgical History:  Procedure Laterality Date   ABDOMINAL HYSTERECTOMY     CERVICAL FUSION  07/2004   COLONOSCOPY  8/11   Hyperplastic polyps   TONSILLECTOMY     Social History   Tobacco Use   Smoking status: Never   Smokeless tobacco: Never  Substance Use Topics   Alcohol use: No   Drug use: No   Social History   Socioeconomic History   Marital status: Widowed    Spouse name: Not on file   Number of children: Not on file   Years of education: Not on file   Highest education level: Not on file  Occupational History   Occupation: unemployed  Tobacco Use   Smoking status: Never  Smokeless tobacco: Never  Substance and Sexual Activity   Alcohol use: No   Drug use: No   Sexual activity: Not Currently    Partners: Male  Other Topics Concern   Not on file  Social History Narrative   Live at home alone   Social Determinants of Health   Financial Resource Strain: Not on file  Food Insecurity: Not on file  Transportation Needs: Not on file  Physical Activity: Not on file  Stress: Not on file  Social Connections: Unknown (03/18/2022)    Received from [facility], [facility]   Social Network    Social Network: Not on file  Intimate Partner Violence: Unknown (02/07/2022)   Received from [employer], [facility]   HITS    Physically Hurt: Not on file    Insult or Talk Down To: Not on file    Threaten Physical Harm: Not on file    Scream or Curse: Not on file   Family Status  Relation Name Status   Mother  Deceased   Father  Deceased   Sister  Alive   Brother  Alive   Brother  Alive   Brother  Manufacturing engineer  (Not Specified)   Other  (Not Specified)   Other  (Not Specified)  No partnership data on file   Family History  Problem Relation Age of Onset   Coronary artery disease Mother    GI Bleed Mother    Cancer Father        LUNG   Lung cancer Father    Clotting disorder Sister        Factor V   Factor V Leiden deficiency Sister    Ankylosing spondylitis Sister    Factor V Leiden deficiency Brother    Leukemia Maternal Uncle    Heart attack Other    Alcohol abuse Other    Stroke Other        Female <50/Female <60   Heart disease Other    Factor V Leiden deficiency Other        THROMBOEBOLISM CLOTTING DISORDER   Alcohol abuse Other    Allergies  Allergen Reactions   Amlodipine Besylate     REACTION: swelling in feet and ankles/rash/pain   Other Itching    INVOKANA      Review of Systems  Constitutional:  Negative for chills, fever and malaise/fatigue.  HENT:  Negative for congestion and hearing loss.   Eyes:  Negative for blurred vision and discharge.  Respiratory:  Negative for cough, sputum production and shortness of breath.   Cardiovascular:  Negative for chest pain, palpitations and leg swelling.  Gastrointestinal:  Negative for abdominal pain, blood in stool, constipation, diarrhea, heartburn, nausea and vomiting.  Genitourinary:  Negative for dysuria, frequency, hematuria and urgency.  Musculoskeletal:  Negative for back pain, falls and myalgias.  Skin:  Negative for rash.   Neurological:  Negative for dizziness, sensory change, loss of consciousness, weakness and headaches.  Endo/Heme/Allergies:  Negative for environmental allergies. Does not bruise/bleed easily.  Psychiatric/Behavioral:  Negative for depression and suicidal ideas. The patient is not nervous/anxious and does not have insomnia.       Objective:     BP 128/80 (BP Location: Left Arm, Patient Position: Sitting, Cuff Size: Large)   Pulse 62   Temp 98.4 F (36.9 C) (Oral)   Resp 18   Ht 5\' 4"  (1.626 m)   Wt 247 lb 12.8 oz (112.4 kg)   SpO2 94%  BMI 42.53 kg/m  BP Readings from Last 3 Encounters:  06/26/23 128/80  12/19/22 110/70  06/17/22 120/68   Wt Readings from Last 3 Encounters:  06/26/23 247 lb 12.8 oz (112.4 kg)  12/19/22 238 lb 12.8 oz (108.3 kg)  06/17/22 245 lb 6.4 oz (111.3 kg)   SpO2 Readings from Last 3 Encounters:  06/26/23 94%  12/19/22 96%  06/17/22 95%      Physical Exam Vitals and nursing note reviewed.  Constitutional:      General: She is not in acute distress.    Appearance: Normal appearance. She is well-developed.  HENT:     Head: Normocephalic and atraumatic.     Right Ear: Tympanic membrane, ear canal and external ear normal. There is no impacted cerumen.     Left Ear: Tympanic membrane, ear canal and external ear normal. There is no impacted cerumen.     Nose: Nose normal.     Mouth/Throat:     Mouth: Mucous membranes are moist.     Pharynx: Oropharynx is clear. No oropharyngeal exudate or posterior oropharyngeal erythema.  Eyes:     General: No scleral icterus.       Right eye: No discharge.        Left eye: No discharge.     Conjunctiva/sclera: Conjunctivae normal.     Pupils: Pupils are equal, round, and reactive to light.  Neck:     Thyroid: No thyromegaly or thyroid tenderness.     Vascular: No JVD.  Cardiovascular:     Rate and Rhythm: Normal rate and regular rhythm.     Heart sounds: Normal heart sounds. No murmur  heard. Pulmonary:     Effort: Pulmonary effort is normal. No respiratory distress.     Breath sounds: Normal breath sounds.  Abdominal:     General: Bowel sounds are normal. There is no distension.     Palpations: Abdomen is soft. There is no mass.     Tenderness: There is no abdominal tenderness. There is no guarding or rebound.  Genitourinary:    Vagina: Normal.  Musculoskeletal:        General: Normal range of motion.     Cervical back: Normal range of motion and neck supple.     Right lower leg: No edema.     Left lower leg: No edema.  Lymphadenopathy:     Cervical: No cervical adenopathy.  Skin:    General: Skin is warm and dry.     Findings: No erythema or rash.  Neurological:     Mental Status: She is alert and oriented to person, place, and time.     Cranial Nerves: No cranial nerve deficit.     Deep Tendon Reflexes: Reflexes are normal and symmetric.  Psychiatric:        Mood and Affect: Mood normal.        Behavior: Behavior normal.        Thought Content: Thought content normal.        Judgment: Judgment normal.      No results found for any visits on 06/26/23.  Last CBC Lab Results  Component Value Date   WBC 9.0 12/19/2022   HGB 14.4 12/19/2022   HCT 43.6 12/19/2022   MCV 92.9 12/19/2022   MCH 31.2 01/01/2007   RDW 13.2 12/19/2022   PLT 177.0 12/19/2022   Last metabolic panel Lab Results  Component Value Date   GLUCOSE 93 12/19/2022   NA 142 12/19/2022   K 4.8 12/19/2022  CL 103 12/19/2022   CO2 32 12/19/2022   BUN 19 12/19/2022   CREATININE 0.95 12/19/2022   GFR 61.21 12/19/2022   CALCIUM 9.6 12/19/2022   PROT 6.9 12/19/2022   ALBUMIN 3.9 12/19/2022   BILITOT 0.6 12/19/2022   ALKPHOS 65 12/19/2022   AST 19 12/19/2022   ALT 17 12/19/2022   Last lipids Lab Results  Component Value Date   CHOL 122 12/19/2022   HDL 48.30 12/19/2022   LDLCALC 52 12/19/2022   TRIG 108.0 12/19/2022   CHOLHDL 3 12/19/2022   Last hemoglobin A1c Lab  Results  Component Value Date   HGBA1C 6.0 12/19/2022   Last thyroid functions Lab Results  Component Value Date   TSH 1.97 04/20/2021   Last vitamin D No results found for: "25OHVITD2", "25OHVITD3", "VD25OH" Last vitamin B12 and Folate No results found for: "VITAMINB12", "FOLATE"    The ASCVD Risk score (Arnett DK, et al., 2019) failed to calculate for the following reasons:   The valid total cholesterol range is 130 to 320 mg/dL    Assessment & Plan:   Problem List Items Addressed This Visit       Unprioritized   Type 2 diabetes mellitus with diabetic neuropathy, without long-term current use of insulin (HCC)    hgba1c to be checked, , minimize simple carbs. Increase exercise as tolerated. Continue current meds       Relevant Medications   dapagliflozin propanediol (FARXIGA) 10 MG TABS tablet   glimepiride (AMARYL) 1 MG tablet   Other Relevant Orders   Comprehensive metabolic panel   Hemoglobin A1c   Microalbumin / creatinine urine ratio   Primary hypertension    Well controlled, no changes to meds. Encouraged heart healthy diet such as the DASH diet and exercise as tolerated.        Relevant Medications   dapagliflozin propanediol (FARXIGA) 10 MG TABS tablet   Other Relevant Orders   Lipid panel   CBC with Differential/Platelet   Comprehensive metabolic panel   Hyperlipidemia    Tolerating statin, encouraged heart healthy diet, avoid trans fats, minimize simple carbs and saturated fats. Increase exercise as tolerated       Colon cancer screening - Primary   Relevant Orders   Ambulatory referral to Gastroenterology   Other Visit Diagnoses     Type 2 diabetes mellitus with hyperglycemia, without long-term current use of insulin (HCC)       Relevant Medications   dapagliflozin propanediol (FARXIGA) 10 MG TABS tablet   glimepiride (AMARYL) 1 MG tablet   Other Relevant Orders   Comprehensive metabolic panel   Hemoglobin A1c   Microalbumin / creatinine  urine ratio      Assessment and Plan    Medication Refills Discussed need for medication refills. Lipitor, Marcelline Deist, and possibly others need to be refilled. Lexapro, fenofibrate, and folic acid are good until next year. Glimepiride will run out soon. -Refill Lipitor, Farxiga, and other necessary medications. -Check on refill status of Ozempic.  Calcium and Vitamin D Supplementation Discussed need for increased calcium and vitamin D supplementation due to recent bone scan results. Currently taking 800 IU of vitamin D and 600 mg of calcium daily. -Increase calcium intake to 1200 mg daily, split into two doses. -Increase vitamin D intake to 2000 IU daily.  Right Arm Pain Reports pain in right arm, from shoulder to elbow. Pain increases with certain movements. -Recommend therapeutic massage for pain relief. -Consider use of a hard ball or foam roller for self-massage  at home.  Degenerative Changes in Spine Discussed recent bone scan results indicating degenerative changes in the spine. No specific symptoms reported. -No immediate action required. Continue monitoring for any new or worsening symptoms.  Colonoscopy Overdue for colonoscopy. -Schedule colonoscopy.  Gene Connect Study Participating in the [employer] study through [facility]. -No immediate action required. Continue participation in study as directed.       No follow-ups on file.    Donato Schultz, DO

## 2023-06-26 NOTE — Assessment & Plan Note (Signed)
Well controlled, no changes to meds. Encouraged heart healthy diet such as the DASH diet and exercise as tolerated.  °

## 2023-06-26 NOTE — Assessment & Plan Note (Signed)
hgba1c to be checked , , minimize simple carbs. Increase exercise as tolerated. Continue current meds  

## 2023-06-26 NOTE — Assessment & Plan Note (Signed)
Tolerating statin, encouraged heart healthy diet, avoid trans fats, minimize simple carbs and saturated fats. Increase exercise as tolerated 

## 2023-07-08 ENCOUNTER — Encounter: Payer: Self-pay | Admitting: Family Medicine

## 2023-07-12 LAB — GENECONNECT MOLECULAR SCREEN: Genetic Analysis Overall Interpretation: NEGATIVE

## 2023-07-12 LAB — HELIX MOLECULAR SCREEN

## 2023-10-17 ENCOUNTER — Other Ambulatory Visit: Payer: Self-pay | Admitting: Family Medicine

## 2023-10-17 DIAGNOSIS — Z1231 Encounter for screening mammogram for malignant neoplasm of breast: Secondary | ICD-10-CM

## 2023-10-23 DIAGNOSIS — Z1231 Encounter for screening mammogram for malignant neoplasm of breast: Secondary | ICD-10-CM

## 2023-12-19 ENCOUNTER — Other Ambulatory Visit: Payer: Self-pay | Admitting: Family Medicine

## 2023-12-19 DIAGNOSIS — E1165 Type 2 diabetes mellitus with hyperglycemia: Secondary | ICD-10-CM

## 2023-12-23 ENCOUNTER — Ambulatory Visit: Payer: 59 | Admitting: Family Medicine

## 2023-12-24 ENCOUNTER — Other Ambulatory Visit: Payer: Self-pay | Admitting: Family Medicine

## 2023-12-24 DIAGNOSIS — I10 Essential (primary) hypertension: Secondary | ICD-10-CM

## 2023-12-24 DIAGNOSIS — E1169 Type 2 diabetes mellitus with other specified complication: Secondary | ICD-10-CM

## 2023-12-24 DIAGNOSIS — E785 Hyperlipidemia, unspecified: Secondary | ICD-10-CM

## 2024-01-05 ENCOUNTER — Other Ambulatory Visit (HOSPITAL_COMMUNITY): Payer: Self-pay

## 2024-01-08 ENCOUNTER — Telehealth

## 2024-01-22 ENCOUNTER — Other Ambulatory Visit: Payer: Self-pay | Admitting: Family Medicine

## 2024-01-22 DIAGNOSIS — F411 Generalized anxiety disorder: Secondary | ICD-10-CM

## 2024-01-22 DIAGNOSIS — R7989 Other specified abnormal findings of blood chemistry: Secondary | ICD-10-CM

## 2024-03-01 ENCOUNTER — Other Ambulatory Visit: Payer: Self-pay | Admitting: Family Medicine

## 2024-03-01 ENCOUNTER — Encounter: Payer: Self-pay | Admitting: Family Medicine

## 2024-03-01 ENCOUNTER — Ambulatory Visit (INDEPENDENT_AMBULATORY_CARE_PROVIDER_SITE_OTHER): Payer: 59 | Admitting: Family Medicine

## 2024-03-01 ENCOUNTER — Telehealth: Payer: Self-pay

## 2024-03-01 ENCOUNTER — Other Ambulatory Visit (HOSPITAL_COMMUNITY): Payer: Self-pay

## 2024-03-01 VITALS — BP 130/70 | HR 68 | Temp 98.2°F | Resp 18 | Ht 64.0 in | Wt 244.8 lb

## 2024-03-01 DIAGNOSIS — E785 Hyperlipidemia, unspecified: Secondary | ICD-10-CM

## 2024-03-01 DIAGNOSIS — E66812 Obesity, class 2: Secondary | ICD-10-CM

## 2024-03-01 DIAGNOSIS — I10 Essential (primary) hypertension: Secondary | ICD-10-CM

## 2024-03-01 DIAGNOSIS — E662 Morbid (severe) obesity with alveolar hypoventilation: Secondary | ICD-10-CM

## 2024-03-01 DIAGNOSIS — E1165 Type 2 diabetes mellitus with hyperglycemia: Secondary | ICD-10-CM | POA: Diagnosis not present

## 2024-03-01 DIAGNOSIS — Z1211 Encounter for screening for malignant neoplasm of colon: Secondary | ICD-10-CM

## 2024-03-01 DIAGNOSIS — R7989 Other specified abnormal findings of blood chemistry: Secondary | ICD-10-CM

## 2024-03-01 DIAGNOSIS — E11621 Type 2 diabetes mellitus with foot ulcer: Secondary | ICD-10-CM

## 2024-03-01 DIAGNOSIS — E1169 Type 2 diabetes mellitus with other specified complication: Secondary | ICD-10-CM | POA: Diagnosis not present

## 2024-03-01 DIAGNOSIS — E1140 Type 2 diabetes mellitus with diabetic neuropathy, unspecified: Secondary | ICD-10-CM

## 2024-03-01 DIAGNOSIS — G43809 Other migraine, not intractable, without status migrainosus: Secondary | ICD-10-CM

## 2024-03-01 DIAGNOSIS — F411 Generalized anxiety disorder: Secondary | ICD-10-CM

## 2024-03-01 DIAGNOSIS — L97529 Non-pressure chronic ulcer of other part of left foot with unspecified severity: Secondary | ICD-10-CM

## 2024-03-01 LAB — HEMOGLOBIN A1C: Hgb A1c MFr Bld: 6.5 % (ref 4.6–6.5)

## 2024-03-01 LAB — MICROALBUMIN / CREATININE URINE RATIO
Creatinine,U: 121.2 mg/dL
Microalb Creat Ratio: UNDETERMINED mg/g (ref 0.0–30.0)
Microalb, Ur: <0.7 mg/dL

## 2024-03-01 LAB — COMPREHENSIVE METABOLIC PANEL WITH GFR
ALT: 15 U/L (ref 0–35)
AST: 18 U/L (ref 0–37)
Albumin: 3.9 g/dL (ref 3.5–5.2)
Alkaline Phosphatase: 75 U/L (ref 39–117)
BUN: 18 mg/dL (ref 6–23)
CO2: 30 meq/L (ref 19–32)
Calcium: 9.2 mg/dL (ref 8.4–10.5)
Chloride: 102 meq/L (ref 96–112)
Creatinine, Ser: 0.81 mg/dL (ref 0.40–1.20)
GFR: 73.49 mL/min (ref >60.00)
Glucose, Bld: 154 mg/dL — ABNORMAL HIGH (ref 70–99)
Potassium: 4.4 meq/L (ref 3.5–5.1)
Sodium: 139 meq/L (ref 135–145)
Total Bilirubin: 0.5 mg/dL (ref 0.2–1.2)
Total Protein: 6.6 g/dL (ref 6.0–8.3)

## 2024-03-01 LAB — LIPID PANEL
Cholesterol: 122 mg/dL (ref 0–200)
HDL: 48.5 mg/dL (ref >39.00)
LDL Cholesterol: 52 mg/dL (ref 0–99)
NonHDL: 73.55
Total CHOL/HDL Ratio: 3
Triglycerides: 107 mg/dL (ref 0.0–149.0)
VLDL: 21.4 mg/dL (ref 0.0–40.0)

## 2024-03-01 LAB — CBC WITH DIFFERENTIAL/PLATELET
Basophils Absolute: 0.1 10*3/uL (ref 0.0–0.1)
Basophils Relative: 0.7 % (ref 0.0–3.0)
Eosinophils Absolute: 0.3 10*3/uL (ref 0.0–0.7)
Eosinophils Relative: 3.2 % (ref 0.0–5.0)
HCT: 41.9 % (ref 36.0–46.0)
Hemoglobin: 13.8 g/dL (ref 12.0–15.0)
Lymphocytes Relative: 19.5 % (ref 12.0–46.0)
Lymphs Abs: 1.9 10*3/uL (ref 0.7–4.0)
MCHC: 32.9 g/dL (ref 30.0–36.0)
MCV: 94.1 fl (ref 78.0–100.0)
Monocytes Absolute: 0.7 10*3/uL (ref 0.1–1.0)
Monocytes Relative: 7.1 % (ref 3.0–12.0)
Neutro Abs: 6.6 10*3/uL (ref 1.4–7.7)
Neutrophils Relative %: 69.5 % (ref 43.0–77.0)
Platelets: 182 10*3/uL (ref 150.0–400.0)
RBC: 4.45 Mil/uL (ref 3.87–5.11)
RDW: 13 % (ref 11.5–15.5)
WBC: 9.5 10*3/uL (ref 4.0–10.5)

## 2024-03-01 MED ORDER — VALSARTAN-HYDROCHLOROTHIAZIDE 160-12.5 MG PO TABS
1.0000 | ORAL_TABLET | Freq: Every day | ORAL | 1 refills | Status: DC
Start: 2024-03-01 — End: 2024-09-02

## 2024-03-01 MED ORDER — NURTEC 75 MG PO TBDP: ORAL_TABLET | ORAL | 1 refills | Status: DC

## 2024-03-01 MED ORDER — DAPAGLIFLOZIN PROPANEDIOL 10 MG PO TABS: 10.0000 mg | ORAL_TABLET | Freq: Every day | ORAL | 1 refills | Status: DC

## 2024-03-01 MED ORDER — ESCITALOPRAM OXALATE 20 MG PO TABS: 20.0000 mg | ORAL_TABLET | Freq: Every day | ORAL | 3 refills | Status: DC

## 2024-03-01 MED ORDER — FENOFIBRATE 160 MG PO TABS: 160.0000 mg | ORAL_TABLET | Freq: Every day | ORAL | 1 refills | Status: DC

## 2024-03-01 MED ORDER — FOLIC ACID 1 MG PO TABS: 1.0000 mg | ORAL_TABLET | Freq: Every day | ORAL | 3 refills | Status: DC

## 2024-03-01 MED ORDER — OZEMPIC (1 MG/DOSE) 4 MG/3ML ~~LOC~~ SOPN: 1.0000 mg | PEN_INJECTOR | SUBCUTANEOUS | 2 refills | Status: DC

## 2024-03-01 MED ORDER — GLIMEPIRIDE 1 MG PO TABS: 1.0000 mg | ORAL_TABLET | Freq: Every day | ORAL | 1 refills | Status: DC

## 2024-03-01 MED ORDER — ATORVASTATIN CALCIUM 10 MG PO TABS
10.0000 mg | ORAL_TABLET | Freq: Every day | ORAL | 1 refills | Status: DC
Start: 2024-03-01 — End: 2024-09-02

## 2024-03-01 NOTE — Assessment & Plan Note (Signed)
 Encourage heart healthy diet such as MIND or DASH diet, increase exercise, avoid trans fats, simple carbohydrates and processed foods, consider a krill or fish or flaxseed oil cap daily.

## 2024-03-01 NOTE — Telephone Encounter (Signed)
 Pharmacy Patient Advocate Encounter  Received notification from [facility] that Prior Authorization for NURTEC 75MG  has been DENIED.  Full denial letter will be uploaded to the media tab. See denial reason below.   PA #/Case ID/Reference #: [id number]

## 2024-03-01 NOTE — Assessment & Plan Note (Signed)
 Cont diet and exercise

## 2024-03-01 NOTE — Assessment & Plan Note (Signed)
Pt is due for colonoscopy.

## 2024-03-01 NOTE — Progress Notes (Signed)
 Established Patient Office Visit  Subjective   Patient ID: Jacqueline Duran, female    DOB: [birth year]  Age: 73 y.o. MRN: [id number]  Chief Complaint  Patient presents with   Hyperlipidemia   Hypertension   Diabetes   Follow-up    HPI Discussed the use of AI scribe software for clinical note transcription with the patient, who gave verbal consent to proceed.  History of Present Illness Jacqueline Duran "Jacqueline Duran" is a 73 year old female who presents for medication refills and follow-up on her arm pain and foot condition.  She experiences ongoing pain in her shoulder area, which she suspects may be related to a rotator cuff issue. She has not consulted an orthopedic specialist yet due to her busy work schedule. The pain has been persistent and affects her daily activities.  She has a foot condition of unknown origin, possibly related to a recent fever. During fever episodes, she describes experiencing 'lightning bolts' in her toes, which she believes may have contributed to the current issue. She plans to see a podiatrist for further evaluation.  She is currently taking valsartan  and Ozempic  for diabetes management. Her blood sugar levels are generally stable, with infrequent hypoglycemic episodes. She does not use insulin  pen needles as her Ozempic  comes with its own needles.  She acknowledges the need for a colonoscopy, which she has not yet undergone. She participated in a genetic study indicating no genetic predisposition to breast or colon cancer.   Patient Active Problem List   Diagnosis Date Noted   Colon cancer screening 06/26/2023   Acute non-recurrent pansinusitis 01/18/2022   Type 2 diabetes mellitus with diabetic peripheral angiopathy without gangrene, without long-term current use of insulin  (HCC) 11/30/2021   Diabetic ulcer of left foot associated with type 2 diabetes mellitus, unspecified part of foot, unspecified ulcer stage (HCC) 04/20/2021   Exposure to COVID-19  virus 03/24/2019   Drug-induced acute pancreatitis 04/28/2017   Blister of left foot 12/16/2013   Obesity (BMI 30-39.9) 07/08/2013   Chest pain 06/04/2012   NAUSEA 09/25/2010   ABNORMAL EXAM-BILIARY TRACT 09/25/2010   History of colonic polyps 09/25/2010   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, unspecified BMI (HCC)    RUQ pain 09/05/2010   Hyperlipidemia 05/14/2010   POSTMENOPAUSAL STATUS 05/14/2010   Type 2 diabetes mellitus with diabetic neuropathy, without long-term current use of insulin  (HCC) 02/06/2010   Anxiety state 03/14/2009   SINUSITIS - ACUTE-NOS 01/16/2009   EDEMA 10/20/2008   HEEL PAIN, RIGHT 08/26/2008   ACUTE FRONTAL SINUSITIS 12/11/2007   ACUTE BRONCHITIS 11/24/2007   CARCINOMA, BASAL CELL, BACK 04/07/2007   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   LEUKOCYTOSIS NOS 04/07/2007   DISORDER, DEPRESSIVE NEC 04/07/2007   LOSS, HEARING NOS 04/07/2007   Primary hypertension 04/03/2007   ECZEMA 04/03/2007   HEADACHE 04/03/2007   Past Medical History:  Diagnosis Date   Colon polyps    Deafness    Right Ear   Diabetes mellitus    Hyperlipidemia    Hypertension    Past Surgical History:  Procedure Laterality Date   ABDOMINAL HYSTERECTOMY     CERVICAL FUSION  07/2004   COLONOSCOPY  8/11   Hyperplastic polyps   TONSILLECTOMY     Social History   Tobacco Use   Smoking status: Never   Smokeless tobacco: Never  Substance Use Topics   Alcohol use: No   Drug use: No   Social History  Socioeconomic History   Marital status: Widowed    Spouse name: Not on file   Number of children: Not on file   Years of education: Not on file   Highest education level: Not on file  Occupational History   Occupation: unemployed  Tobacco Use   Smoking status: Never   Smokeless tobacco: Never  Substance and Sexual Activity   Alcohol use: No   Drug use: No   Sexual activity: Not Currently     Partners: Male  Other Topics Concern   Not on file  Social History Narrative   Live at home alone   Social Drivers of Health   Financial Resource Strain: Not on file  Food Insecurity: Not on file  Transportation Needs: Not on file  Physical Activity: Not on file  Stress: Not on file  Social Connections: Unknown (03/18/2022)   Received from [facility], [facility]   Social Network    Social Network: Not on file  Intimate Partner Violence: Unknown (02/07/2022)   Received from [employer], [facility]   HITS    Physically Hurt: Not on file    Insult or Talk Down To: Not on file    Threaten Physical Harm: Not on file    Scream or Curse: Not on file   Family Status  Relation Name Status   Mother  Deceased   Father  Deceased   Sister  Alive   Brother  Alive   Brother  Alive   Brother  Manufacturing engineer  (Not Specified)   Other  (Not Specified)   Other  (Not Specified)  No partnership data on file   Family History  Problem Relation Age of Onset   Coronary artery disease Mother    GI Bleed Mother    Cancer Father        LUNG   Lung cancer Father    Clotting disorder Sister        Factor V   Factor V Leiden deficiency Sister    Ankylosing spondylitis Sister    Factor V Leiden deficiency Brother    Leukemia Maternal Uncle    Heart attack Other    Alcohol abuse Other    Stroke Other        Female <50/Female <60   Heart disease Other    Factor V Leiden deficiency Other        THROMBOEBOLISM CLOTTING DISORDER   Alcohol abuse Other    Allergies  Allergen Reactions   Amlodipine Besylate     REACTION: swelling in feet and ankles/rash/pain   Other Itching    INVOKANA       Review of Systems  Constitutional:  Negative for fever and malaise/fatigue.  HENT:  Negative for congestion.   Eyes:  Negative for blurred vision.  Respiratory:  Negative for shortness of breath.   Cardiovascular:  Negative for chest pain, palpitations and leg swelling.   Gastrointestinal:  Negative for abdominal pain, blood in stool and nausea.  Genitourinary:  Negative for dysuria and frequency.  Musculoskeletal:  Negative for falls.  Skin:  Negative for rash.  Neurological:  Negative for dizziness, loss of consciousness and headaches.  Endo/Heme/Allergies:  Negative for environmental allergies.  Psychiatric/Behavioral:  Negative for depression. The patient is not nervous/anxious.    Diabetic Foot Exam - Simple   Simple Foot Form Diabetic Foot exam was performed with the following findings: Yes 03/01/2024  9:26 AM  Visual Inspection See comments: Yes Sensation Testing See comments: Yes  Pulse Check Posterior Tibialis and Dorsalis pulse intact bilaterally: Yes Comments NO FEELING IN EITHER FOOT WITH MONOFILAMENT----  NO CHANGE CALLOUS B/L FEET        Objective:     BP 130/70 (BP Location: Left Arm, Patient Position: Sitting, Cuff Size: Large)   Pulse 68   Temp 98.2 F (36.8 C) (Oral)   Resp 18   Ht 5\' 4"  (1.626 m)   Wt 244 lb 12.8 oz (111 kg)   SpO2 96%   BMI 42.02 kg/m  BP Readings from Last 3 Encounters:  03/01/24 130/70  06/26/23 128/80  12/19/22 110/70   Wt Readings from Last 3 Encounters:  03/01/24 244 lb 12.8 oz (111 kg)  06/26/23 247 lb 12.8 oz (112.4 kg)  12/19/22 238 lb 12.8 oz (108.3 kg)   SpO2 Readings from Last 3 Encounters:  03/01/24 96%  06/26/23 94%  12/19/22 96%      Physical Exam Vitals and nursing note reviewed.  Constitutional:      General: She is not in acute distress.    Appearance: Normal appearance. She is well-developed.  HENT:     Head: Normocephalic and atraumatic.  Eyes:     General: No scleral icterus.       Right eye: No discharge.        Left eye: No discharge.  Cardiovascular:     Rate and Rhythm: Normal rate and regular rhythm.     Heart sounds: No murmur heard. Pulmonary:     Effort: Pulmonary effort is normal. No respiratory distress.     Breath sounds: Normal breath sounds.   Musculoskeletal:        General: Normal range of motion.     Cervical back: Normal range of motion and neck supple.     Right lower leg: No edema.     Left lower leg: No edema.  Skin:    General: Skin is warm and dry.  Neurological:     General: No focal deficit present.     Mental Status: She is alert and oriented to person, place, and time.  Psychiatric:        Mood and Affect: Mood normal.        Behavior: Behavior normal.        Thought Content: Thought content normal.        Judgment: Judgment normal.     No results found for any visits on 03/01/24.  Last CBC Lab Results  Component Value Date   WBC 9.4 06/26/2023   HGB 13.4 06/26/2023   HCT 41.9 06/26/2023   MCV 94.0 06/26/2023   MCH 31.2 01/01/2007   RDW 13.5 06/26/2023   PLT 190.0 06/26/2023   Last metabolic panel Lab Results  Component Value Date   GLUCOSE 116 (H) 06/26/2023   NA 141 06/26/2023   K 4.3 06/26/2023   CL 104 06/26/2023   CO2 31 06/26/2023   BUN 16 06/26/2023   CREATININE 0.92 06/26/2023   GFR 63.38 06/26/2023   CALCIUM  9.8 06/26/2023   PROT 6.7 06/26/2023   ALBUMIN 3.9 06/26/2023   BILITOT 0.5 06/26/2023   ALKPHOS 71 06/26/2023   AST 19 06/26/2023   ALT 16 06/26/2023   Last lipids Lab Results  Component Value Date   CHOL 124 06/26/2023   HDL 48.60 06/26/2023   LDLCALC 55 06/26/2023   TRIG 100.0 06/26/2023   CHOLHDL 3 06/26/2023   Last hemoglobin A1c Lab Results  Component Value Date   HGBA1C 6.4 06/26/2023  Last thyroid  functions Lab Results  Component Value Date   TSH 1.97 04/20/2021   Last vitamin D No results found for: "25OHVITD2", "25OHVITD3", "VD25OH" Last vitamin B12 and Folate No results found for: "VITAMINB12", "FOLATE"    The ASCVD Risk score (Arnett DK, et al., 2019) failed to calculate for the following reasons:   The valid total cholesterol range is 130 to 320 mg/dL    Assessment & Plan:   Problem List Items Addressed This Visit        Unprioritized   Diabetic ulcer of left foot associated with type 2 diabetes mellitus, unspecified part of foot, unspecified ulcer stage (HCC)   Relevant Medications   atorvastatin  (LIPITOR ) 10 MG tablet   dapagliflozin  propanediol (FARXIGA ) 10 MG TABS tablet   glimepiride  (AMARYL ) 1 MG tablet   Semaglutide , 1 MG/DOSE, (OZEMPIC , 1 MG/DOSE,) 4 MG/3ML SOPN   valsartan -hydrochlorothiazide  (DIOVAN -HCT) 160-12.5 MG tablet   Type 2 diabetes mellitus with diabetic neuropathy, without long-term current use of insulin  (HCC)   hgba1c to be checked , minimize simple carbs. Increase exercise as tolerated. Continue current meds       Relevant Medications   atorvastatin  (LIPITOR ) 10 MG tablet   dapagliflozin  propanediol (FARXIGA ) 10 MG TABS tablet   glimepiride  (AMARYL ) 1 MG tablet   Semaglutide , 1 MG/DOSE, (OZEMPIC , 1 MG/DOSE,) 4 MG/3ML SOPN   valsartan -hydrochlorothiazide  (DIOVAN -HCT) 160-12.5 MG tablet   Primary hypertension   Well controlled, no changes to meds. Encouraged heart healthy diet such as the DASH diet and exercise as tolerated.        Relevant Medications   atorvastatin  (LIPITOR ) 10 MG tablet   dapagliflozin  propanediol (FARXIGA ) 10 MG TABS tablet   fenofibrate  160 MG tablet   valsartan -hydrochlorothiazide  (DIOVAN -HCT) 160-12.5 MG tablet   Other Relevant Orders   CBC with Differential/Platelet   Comprehensive metabolic panel with GFR   Microalbumin / creatinine urine ratio   Hyperlipidemia   Encourage heart healthy diet such as MIND or DASH diet, increase exercise, avoid trans fats, simple carbohydrates and processed foods, consider a krill or fish or flaxseed oil cap daily.        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,  unspecified BMI (HCC)   Con't diet and exercise       Relevant Medications   dapagliflozin  propanediol (FARXIGA ) 10 MG TABS tablet   glimepiride  (AMARYL ) 1 MG tablet   Semaglutide , 1 MG/DOSE, (OZEMPIC , 1 MG/DOSE,) 4 MG/3ML SOPN   Other Visit Diagnoses       Type 2 diabetes mellitus with hyperglycemia, without long-term current use of insulin  (HCC)    -  Primary   Relevant Medications   atorvastatin  (LIPITOR ) 10 MG tablet   dapagliflozin  propanediol (FARXIGA ) 10 MG TABS tablet   glimepiride  (AMARYL ) 1 MG tablet   Semaglutide , 1 MG/DOSE, (OZEMPIC , 1 MG/DOSE,) 4 MG/3ML SOPN   valsartan -hydrochlorothiazide  (DIOVAN -HCT) 160-12.5 MG tablet   Other Relevant Orders   Lipid panel   Comprehensive metabolic panel with GFR   Hemoglobin A1c   Microalbumin / creatinine urine ratio     Hyperlipidemia associated with type 2 diabetes mellitus (HCC)       Relevant Medications   atorvastatin  (LIPITOR ) 10 MG tablet   dapagliflozin  propanediol (FARXIGA ) 10 MG TABS  tablet   fenofibrate  160 MG tablet   glimepiride  (AMARYL ) 1 MG tablet   Semaglutide , 1 MG/DOSE, (OZEMPIC , 1 MG/DOSE,) 4 MG/3ML SOPN   valsartan -hydrochlorothiazide  (DIOVAN -HCT) 160-12.5 MG tablet   Other Relevant Orders   Lipid panel   Comprehensive metabolic panel with GFR     Hyperlipidemia LDL goal <70       Relevant Medications   atorvastatin  (LIPITOR ) 10 MG tablet   fenofibrate  160 MG tablet   valsartan -hydrochlorothiazide  (DIOVAN -HCT) 160-12.5 MG tablet     Generalized anxiety disorder       Relevant Medications   escitalopram  (LEXAPRO ) 20 MG tablet     Elevated homocysteine       Relevant Medications   folic acid  (FOLVITE ) 1 MG tablet     Other migraine without status migrainosus, not intractable       Relevant Medications   atorvastatin  (LIPITOR ) 10 MG tablet   escitalopram  (LEXAPRO ) 20 MG tablet   fenofibrate  160 MG tablet   Rimegepant Sulfate (NURTEC) 75 MG TBDP   valsartan -hydrochlorothiazide  (DIOVAN -HCT) 160-12.5  MG tablet     Essential hypertension       Relevant Medications   atorvastatin  (LIPITOR ) 10 MG tablet   fenofibrate  160 MG tablet   valsartan -hydrochlorothiazide  (DIOVAN -HCT) 160-12.5 MG tablet     Assessment and Plan Assessment & Plan Wellness Visit   A routine wellness visit was conducted, emphasizing the importance of regular screenings and health maintenance. Concerns about colonoscopy and alternative screening methods were addressed. Cologuard was explained as a valid option for three years, though a follow-up colonoscopy may be needed if results are positive. Colonoscopy remains the standard of care. She is encouraged to schedule a colonoscopy before the end of the year and consider Cologuard as an alternative. Valsartan  and Ozempic  prescriptions were refilled.  Type 2 diabetes mellitus   Type 2 diabetes mellitus is managed with Ozempic . Blood sugar levels are generally well-controlled with infrequent hypoglycemic episodes. The potential for increasing the Ozempic  dose was discussed if necessary. Continuous glucose monitoring is not recommended as she is not on insulin . Continue Ozempic  as prescribed and monitor blood sugar levels, considering dose adjustment if needed.  Rotator cuff syndrome   Suspected rotator cuff syndrome presents with shoulder pain. No recent orthopedic consultation has occurred due to a busy schedule. She is advised to schedule an appointment with an orthopedic specialist for further evaluation.  Foot lesion   A foot lesion of unknown origin is present. She recalls a fever episode with toe pain, possibly related to the lesion. No recent podiatry consultation has been made. She is advised to schedule an appointment with a podiatrist for evaluation of the foot lesion.    Return in about 6 months (around 08/31/2024) for annual exam, fasting.    Owyn Raulston R Lowne Chase, DO

## 2024-03-01 NOTE — Telephone Encounter (Signed)
 Pharmacy Patient Advocate Encounter   Received notification from CoverMyMeds that prior authorization for Nurtec 75MG  dispersible tablets is required/requested.   Insurance verification completed.   The patient is insured through [facility] .   Per test claim: PA required; PA submitted to above mentioned insurance via CoverMyMeds Key/confirmation #/EOC [id number] Status is pending

## 2024-03-01 NOTE — Patient Instructions (Signed)

## 2024-03-01 NOTE — Assessment & Plan Note (Signed)
 Well controlled, no changes to meds. Encouraged heart healthy diet such as the DASH diet and exercise as tolerated.

## 2024-03-01 NOTE — Assessment & Plan Note (Signed)
 hgba1c to be checked, minimize simple carbs. Increase exercise as tolerated. Continue current meds

## 2024-03-06 ENCOUNTER — Encounter: Payer: Self-pay | Admitting: Family Medicine

## 2024-03-07 ENCOUNTER — Encounter: Payer: Self-pay | Admitting: Family Medicine

## 2024-03-15 ENCOUNTER — Telehealth: Payer: Self-pay

## 2024-03-15 ENCOUNTER — Other Ambulatory Visit (HOSPITAL_COMMUNITY): Payer: Self-pay

## 2024-03-15 NOTE — Telephone Encounter (Signed)
 Pharmacy Patient Advocate Encounter   Received notification from Patient Advice Request messages that prior authorization for Farxiga  10mg  tabs is required/requested.   Insurance verification completed.   The patient is insured through [facility] .   Per test claim: PA required; PA submitted to above mentioned insurance via CoverMyMeds Key/confirmation #/EOC [id number] Status is pending

## 2024-03-15 NOTE — Telephone Encounter (Signed)
 Pharmacy Patient Advocate Encounter  Received notification from [facility] that Prior Authorization for Farxiga  10mg  tabs has been APPROVED from 03/15/24 to 03/15/27   PA #/Case ID/Reference #: [id number]  Left a message at [facility] to notify of the approval.

## 2024-05-18 ENCOUNTER — Ambulatory Visit

## 2024-07-11 ENCOUNTER — Other Ambulatory Visit: Payer: Self-pay | Admitting: Family Medicine

## 2024-07-11 DIAGNOSIS — E1165 Type 2 diabetes mellitus with hyperglycemia: Secondary | ICD-10-CM

## 2024-07-21 ENCOUNTER — Ambulatory Visit (INDEPENDENT_AMBULATORY_CARE_PROVIDER_SITE_OTHER): Admitting: *Deleted

## 2024-07-21 VITALS — Ht 64.0 in | Wt 244.0 lb

## 2024-07-21 DIAGNOSIS — Z0000 Encounter for general adult medical examination without abnormal findings: Secondary | ICD-10-CM

## 2024-07-21 NOTE — Progress Notes (Signed)
 Please attest this visit in the absence of patient primary care provider.    Subjective:   Jacqueline Duran is a 73 y.o. who presents for a Medicare Wellness preventive visit.  As a reminder, Annual Wellness Visits don't include a physical exam, and some assessments may be limited, especially if this visit is performed virtually. We may recommend an in-person follow-up visit with your provider if needed.  Visit Complete: Virtual I connected with  Claud CHRISTELLA Ada on 07/21/24 by a audio enabled telemedicine application and verified that I am speaking with the correct person using two identifiers.  Patient Location: Home  Provider Location: Office/Clinic  I discussed the limitations of evaluation and management by telemedicine. The patient expressed understanding and agreed to proceed.  Vital Signs: Because this visit was a virtual/telehealth visit, some criteria may be missing or patient reported. Any vitals not documented were not able to be obtained and vitals that have been documented are patient reported.  VideoDeclined- This patient declined Librarian, academic. Therefore the visit was completed with audio only.  Persons Participating in Visit: Patient.  AWV Questionnaire: Yes: Patient Medicare AWV questionnaire was completed by the patient on 07/14/24; I have confirmed that all information answered by patient is correct and no changes since this date.  Cardiac Risk Factors include: advanced age (>88men, >42 women);diabetes mellitus;hypertension;dyslipidemia;Other (see comment);obesity (BMI >30kg/m2), Risk factor comments: basal cell carcinoma     Objective:    Today's Vitals   07/21/24 1459  Weight: 244 lb (110.7 kg)  Height: 5' 4 (1.626 m)   Body mass index is 41.88 kg/m.     07/21/2024    3:13 PM  Advanced Directives  Does Patient Have a Medical Advance Directive? Yes  Type of Estate agent of [place];Living will   Does patient want to make changes to medical advance directive? No - Patient declined  Copy of Healthcare Power of Attorney in Chart? No - copy requested    Current Medications (verified) Outpatient Encounter Medications as of 07/21/2024  Medication Sig   ascorbic acid (VITAMIN C) 500 MG tablet Take 1,000 mg by mouth daily.    aspirin 81 MG tablet Take 81 mg by mouth daily.     atorvastatin  (LIPITOR ) 10 MG tablet Take 1 tablet (10 mg total) by mouth daily.   dapagliflozin  propanediol (FARXIGA ) 10 MG TABS tablet TAKE 1 TABLET BY MOUTH EVERY DAY   escitalopram  (LEXAPRO ) 20 MG tablet Take 1 tablet (20 mg total) by mouth daily.   fenofibrate  160 MG tablet Take 1 tablet (160 mg total) by mouth daily.   folic acid  (FOLVITE ) 1 MG tablet Take 1 tablet (1 mg total) by mouth daily.   glimepiride  (AMARYL ) 1 MG tablet Take 1 tablet (1 mg total) by mouth daily.   Insulin  Pen Needle (BD PEN NEEDLE MICRO U/F) 32G X 6 MM MISC To use w/ Ozempic    Magnesium 500 MG TABS Take 1 tablet by mouth daily.   Multiple Vitamin (MULTIVITAMIN) tablet Take 1 tablet by mouth daily.     Semaglutide , 1 MG/DOSE, (OZEMPIC , 1 MG/DOSE,) 4 MG/3ML SOPN Inject 1 mg into the skin once a week.   valsartan -hydrochlorothiazide  (DIOVAN -HCT) 160-12.5 MG tablet Take 1 tablet by mouth daily.   VITAMIN E PO Take by mouth.   glucose blood (ONE TOUCH ULTRA TEST) test strip Check blood sugar once daily. Dx:E11.9 (Patient not taking: Reported on 07/21/2024)   NURTEC 75 MG TBDP TAKE 1 TABLET BY MOUTH  EVERY DAY AS NEEDED   No facility-administered encounter medications on file as of 07/21/2024.    Allergies (verified) Amlodipine besylate and Other   History: Past Medical History:  Diagnosis Date   Colon polyps    Deafness    Right Ear   Diabetes mellitus    Hyperlipidemia    Hypertension    Past Surgical History:  Procedure Laterality Date   ABDOMINAL HYSTERECTOMY     CERVICAL FUSION  07/2004   COLONOSCOPY  8/11   Hyperplastic  polyps   TONSILLECTOMY     Family History  Problem Relation Age of Onset   Coronary artery disease Mother    GI Bleed Mother    Cancer Father        LUNG   Lung cancer Father    Clotting disorder Sister        Factor V   Factor V Leiden deficiency Sister    Ankylosing spondylitis Sister    Factor V Leiden deficiency Brother    Leukemia Maternal Uncle    Heart attack Other    Alcohol abuse Other    Stroke Other        Female <50/Female <60   Heart disease Other    Factor V Leiden deficiency Other        THROMBOEBOLISM CLOTTING DISORDER   Alcohol abuse Other    Social History   Socioeconomic History   Marital status: Widowed    Spouse name: Not on file   Number of children: Not on file   Years of education: Not on file   Highest education level: Associate degree: occupational, Scientist, product/process development, or vocational program  Occupational History   Occupation: unemployed  Tobacco Use   Smoking status: Never   Smokeless tobacco: Never  Substance and Sexual Activity   Alcohol use: No   Drug use: No   Sexual activity: Not Currently    Partners: Male  Other Topics Concern   Not on file  Social History Narrative   Live at home alone   Social Drivers of Health   Financial Resource Strain: Low Risk  (07/14/2024)   Overall Financial Resource Strain (CARDIA)    Difficulty of Paying Living Expenses: Not hard at all  Food Insecurity: No Food Insecurity (07/14/2024)   Hunger Vital Sign    Worried About Running Out of Food in the Last Year: Never true    Ran Out of Food in the Last Year: Never true  Transportation Needs: No Transportation Needs (07/14/2024)   PRAPARE - Administrator, Civil Service (Medical): No    Lack of Transportation (Non-Medical): No  Physical Activity: Insufficiently Active (07/21/2024)   Exercise Vital Sign    Days of Exercise per Week: 2 days    Minutes of Exercise per Session: 10 min  Stress: Stress Concern Present (07/21/2024)   [employer] of  Occupational Health - Occupational Stress Questionnaire    Feeling of Stress: To some extent  Social Connections: Moderately Isolated (07/14/2024)   Social Connection and Isolation Panel    Frequency of Communication with Friends and Family: More than three times a week    Frequency of Social Gatherings with Friends and Family: More than three times a week    Attends Religious Services: 1 to 4 times per year    Active Member of [employer] or Organizations: No    Attends Banker Meetings: Not on file    Marital Status: Widowed    Tobacco Counseling Counseling given: Not  Answered    Clinical Intake:  Pre-visit preparation completed: Yes  Pain : No/denies pain     BMI - recorded: 41.88 Nutritional Status: BMI > 30  Obese Nutritional Risks: None Diabetes: No  Lab Results  Component Value Date   HGBA1C 6.5 03/01/2024   HGBA1C 6.4 06/26/2023   HGBA1C 6.0 12/19/2022     How often do you need to have someone help you when you read instructions, pamphlets, or other written materials from your doctor or pharmacy?: 1 - Never What is the last grade level you completed in school?: Associates degree  Interpreter Needed?: No  Information entered by :: Lolita Libra, CMA(AAMA)   Activities of Daily Living     07/14/2024    3:16 PM  In your present state of health, do you have any difficulty performing the following activities:  Hearing? 0  Vision? 0  Difficulty concentrating or making decisions? 0  Walking or climbing stairs? 0  Dressing or bathing? 0  Doing errands, shopping? 0  Preparing Food and eating ? N  Using the Toilet? N  In the past six months, have you accidently leaked urine? Y  Comment if coughs, sneezes, laughs. Hx of hysterectomy  Do you have problems with loss of bowel control? N  Managing your Medications? N  Managing your Finances? N  Housekeeping or managing your Housekeeping? N    Patient Care Team: Antonio Meth, Jamee SAUNDERS, DO as PCP -  [facility], Max T, DPM as Consulting Physician (Podiatry) Pa, [facility] (Ophthalmology)  I have updated your Care Teams any recent Medical Services you may have received from other providers in the past year.     Assessment:   This is a routine wellness examination for Ch[place].  Hearing/Vision screen Hearing Screening - Comments:: Has hearing loss, no hearing aids currently.  Vision Screening - Comments:: Past due with [facility] in [place] and has appt scheduled.   Goals Addressed   None    Depression Screen     07/21/2024    3:11 PM 03/01/2024    8:33 AM 06/26/2023    8:29 AM 06/17/2022    8:23 AM 04/20/2021    9:19 AM 06/29/2018    8:49 AM 12/13/2016    8:29 AM  PHQ 2/9 Scores  PHQ - 2 Score 0 0 0 0 0 0 0  PHQ- 9 Score 0          Fall Risk     07/14/2024    3:16 PM 03/01/2024    8:32 AM 06/26/2023    8:29 AM 06/17/2022    8:23 AM 11/30/2021    9:06 AM  Fall Risk   Falls in the past year? 0 0 0 0 0  Number falls in past yr: 0 0 0 0 0  Injury with Fall? 0 0 0 0 0  Follow up Education provided Falls evaluation completed Falls evaluation completed Falls evaluation completed  Falls evaluation completed      Data saved with a previous flowsheet row definition    MEDICARE RISK AT HOME:  Medicare Risk at Home Any stairs in or around the home?: (Patient-Rptd) Yes If so, are there any without handrails?: (Patient-Rptd) Yes Home free of loose throw rugs in walkways, pet beds, electrical cords, etc?: (Patient-Rptd) Yes Adequate lighting in your home to reduce risk of falls?: (Patient-Rptd) Yes Life alert?: (Patient-Rptd) No Use of a cane, walker or w/c?: (Patient-Rptd) No Grab bars in the bathroom?: (Patient-Rptd) No Shower chair  or bench in shower?: (Patient-Rptd) No Elevated toilet seat or a handicapped toilet?: (Patient-Rptd) No  TIMED UP AND GO:  Was the test performed?  No,audio  Cognitive Function: 6CIT completed        07/21/2024    3:14 PM   6CIT Screen  What Year? 0 points  What month? 0 points  What time? 0 points  Count back from 20 0 points  Months in reverse 0 points  Repeat phrase 0 points  Total Score 0 points    Immunizations Immunization History  Administered Date(s) Administered    sv, Bivalent, Protein Subunit Rsvpref,pf (Abrysvo) 07/20/2023   INFLUENZA, HIGH DOSE SEASONAL PF 07/20/2023   Influenza Split 08/06/2012   Influenza Whole 11/04/2000, 08/19/2007, 07/19/2009   Influenza,inj,Quad PF,6+ Mos 08/16/2013, 07/15/2014, 08/13/2016   Influenza-Unspecified 08/12/2018, 08/10/2019, 08/17/2020, 08/28/2021, 08/06/2022   PFIZER(Purple Top)SARS-COV-2 Vaccination 11/25/2019, 12/16/2019, 08/07/2020   PNEUMOCOCCAL CONJUGATE-20 06/17/2022   Pneumococcal Conjugate-13 01/08/2019   Pneumococcal Polysaccharide-23 06/04/2012, 12/13/2016   Td 07/06/1999, 05/16/2009   Tdap 09/07/2019   Zoster Recombinant(Shingrix ) 06/29/2018, 09/04/2018, 09/07/2019   Zoster, Live 07/15/2014    Screening Tests Health Maintenance  Topic Date Due   Colonoscopy  06/12/2020   Influenza Vaccine  06/04/2024   OPHTHALMOLOGY EXAM  06/15/2024   COVID-19 Vaccine (4 - 2025-26 season) 07/21/2025 (Originally 07/05/2024)   HEMOGLOBIN A1C  08/31/2024   Diabetic kidney evaluation - eGFR measurement  03/01/2025   Diabetic kidney evaluation - Urine ACR  03/01/2025   FOOT EXAM  03/01/2025   Medicare Annual Wellness (AWV)  07/21/2025   DTaP/Tdap/Td (4 - Td or Tdap) 09/06/2029   Pneumococcal Vaccine: 50+ Years  Completed   DEXA SCAN  Completed   Hepatitis C Screening  Completed   Zoster Vaccines- Shingrix   Completed   HPV VACCINES  Aged Out   Meningococcal B Vaccine  Aged Out   Mammogram  Discontinued    Health Maintenance Items Addressed: Plans to do colonoscopy after the first of the year. Will get flu vaccine and mammogram through mobile unit with [facility] and send us  the results.   Additional Screening:  Vision Screening: Recommended annual  ophthalmology exams for early detection of glaucoma and other disorders of the eye. Is the patient up to date with their annual eye exam?  No  has appt scheduled next week with [facility].. Who is the provider or what is the name of the office in which the patient attends annual eye exams? [facility]  Dental Screening: Recommended annual dental exams for proper oral hygiene  Community Resource Referral / Chronic Care Management: CRR required this visit?  No   CCM required this visit?  No   Plan:    I have personally reviewed and noted the following in the patient's chart:   Medical and social history Use of alcohol, tobacco or illicit drugs  Current medications and supplements including opioid prescriptions. Patient is not currently taking opioid prescriptions. Functional ability and status Nutritional status Physical activity Advanced directives List of other physicians Hospitalizations, surgeries, and ER visits in previous 12 months Vitals Screenings to include cognitive, depression, and falls Referrals and appointments  In addition, I have reviewed and discussed with patient certain preventive protocols, quality metrics, and best practice recommendations. A written personalized care plan for preventive services as well as general preventive health recommendations were provided to patient.   Lolita Libra, CMA   07/21/2024   After Visit Summary: (MyChart) Due to this being a telephonic visit, the  after visit summary with patients personalized plan was offered to patient via MyChart   Notes: Nothing significant to report at this time.

## 2024-07-21 NOTE — Patient Instructions (Addendum)
 Jacqueline Duran , Thank you for taking time out of your busy schedule to complete your Annual Wellness Visit with me. I enjoyed our conversation and look forward to speaking with you again next year. I, as well as your care team,  appreciate your ongoing commitment to your health goals. Please review the following plan we discussed and let me know if I can assist you in the future. Your Game plan/ To Do List   Referrals: If you haven't heard from the office you've been referred to, please reach out to them at the phone provided.   Colonoscopy:  [phone]. For you to schedule when transportation can  be arranged. They will probably do a consultation before scheduling your procedure.  Please send us  a copy of your mammogram result and flu vaccine date once you complete those through your employer.   Follow up Visits: Next Medicare AWV with our clinical staff: 07/27/25 3pm, telephone.    Next Office Visit with your provider: 09/02/24 8:40am, Dr Antonio Meth  Clinician Recommendations:  Aim for 30 minutes of exercise or brisk walking, 6-8 glasses of water, and 5 servings of fruits and vegetables each day.       This is a list of the screening recommended for you and due dates:  Health Maintenance  Topic Date Due   Medicare Annual Wellness Visit  Never done   Colon Cancer Screening  06/12/2020   Flu Shot  06/04/2024   Eye exam for diabetics  06/15/2024   COVID-19 Vaccine (4 - 2025-26 season) 07/05/2024   Hemoglobin A1C  08/31/2024   Yearly kidney function blood test for diabetes  03/01/2025   Yearly kidney health urinalysis for diabetes  03/01/2025   Complete foot exam   03/01/2025   DTaP/Tdap/Td vaccine (4 - Td or Tdap) 09/06/2029   Pneumococcal Vaccine for age over 45  Completed   DEXA scan (bone density measurement)  Completed   Hepatitis C Screening  Completed   Zoster (Shingles) Vaccine  Completed   HPV Vaccine  Aged Out   Meningitis B Vaccine  Aged Out   Breast Cancer Screening   Discontinued    Advanced directives: (Copy Requested) Please bring a copy of your health care power of attorney and living will to the office to be added to your chart at your convenience. You can mail to [facility] [address] [place], [place] [address] or email to [email or web address] Advance Care Planning is important because it:  [x]  Makes sure you receive the medical care that is consistent with your values, goals, and preferences  [x]  It provides guidance to your family and loved ones and reduces their decisional burden about whether or not they are making the right decisions based on your wishes.  Follow the link provided in your after visit summary or read over the paperwork we have mailed to you to help you started getting your Advance Directives in place. If you need assistance in completing these, please reach out to us  so that we can help you!  See attachments for Preventive Care and Fall Prevention Tips.

## 2024-07-27 LAB — HM DIABETES EYE EXAM

## 2024-08-10 ENCOUNTER — Encounter: Payer: Self-pay | Admitting: Family Medicine

## 2024-08-18 ENCOUNTER — Encounter: Payer: Self-pay | Admitting: Family Medicine

## 2024-09-02 ENCOUNTER — Encounter: Payer: Self-pay | Admitting: Family Medicine

## 2024-09-02 ENCOUNTER — Ambulatory Visit: Payer: Self-pay | Admitting: Family Medicine

## 2024-09-02 ENCOUNTER — Ambulatory Visit (INDEPENDENT_AMBULATORY_CARE_PROVIDER_SITE_OTHER): Admitting: Family Medicine

## 2024-09-02 VITALS — BP 104/68 | HR 68 | Temp 98.3°F | Resp 18 | Ht 64.0 in | Wt 240.6 lb

## 2024-09-02 DIAGNOSIS — E785 Hyperlipidemia, unspecified: Secondary | ICD-10-CM | POA: Diagnosis not present

## 2024-09-02 DIAGNOSIS — E1140 Type 2 diabetes mellitus with diabetic neuropathy, unspecified: Secondary | ICD-10-CM

## 2024-09-02 DIAGNOSIS — F411 Generalized anxiety disorder: Secondary | ICD-10-CM | POA: Diagnosis not present

## 2024-09-02 DIAGNOSIS — E1165 Type 2 diabetes mellitus with hyperglycemia: Secondary | ICD-10-CM | POA: Diagnosis not present

## 2024-09-02 DIAGNOSIS — I10 Essential (primary) hypertension: Secondary | ICD-10-CM

## 2024-09-02 DIAGNOSIS — Z7984 Long term (current) use of oral hypoglycemic drugs: Secondary | ICD-10-CM

## 2024-09-02 DIAGNOSIS — E1169 Type 2 diabetes mellitus with other specified complication: Secondary | ICD-10-CM

## 2024-09-02 DIAGNOSIS — E669 Obesity, unspecified: Secondary | ICD-10-CM

## 2024-09-02 DIAGNOSIS — R7989 Other specified abnormal findings of blood chemistry: Secondary | ICD-10-CM

## 2024-09-02 LAB — LIPID PANEL
Cholesterol: 121 mg/dL (ref 0–200)
HDL: 53.3 mg/dL (ref >39.00)
LDL Cholesterol: 47 mg/dL (ref 0–99)
NonHDL: 68.18
Total CHOL/HDL Ratio: 2
Triglycerides: 108 mg/dL (ref 0.0–149.0)
VLDL: 21.6 mg/dL (ref 0.0–40.0)

## 2024-09-02 LAB — COMPREHENSIVE METABOLIC PANEL WITH GFR
ALT: 16 U/L (ref 0–35)
AST: 20 U/L (ref 0–37)
Albumin: 4.1 g/dL (ref 3.5–5.2)
Alkaline Phosphatase: 75 U/L (ref 39–117)
BUN: 17 mg/dL (ref 6–23)
CO2: 29 meq/L (ref 19–32)
Calcium: 9.4 mg/dL (ref 8.4–10.5)
Chloride: 104 meq/L (ref 96–112)
Creatinine, Ser: 0.93 mg/dL (ref 0.40–1.20)
GFR: 62.04 mL/min (ref >60.00)
Glucose, Bld: 87 mg/dL (ref 70–99)
Potassium: 4.3 meq/L (ref 3.5–5.1)
Sodium: 142 meq/L (ref 135–145)
Total Bilirubin: 0.6 mg/dL (ref 0.2–1.2)
Total Protein: 7 g/dL (ref 6.0–8.3)

## 2024-09-02 LAB — CBC WITH DIFFERENTIAL/PLATELET
Basophils Absolute: 0 K/uL (ref 0.0–0.1)
Basophils Relative: 0.5 % (ref 0.0–3.0)
Eosinophils Absolute: 0.2 K/uL (ref 0.0–0.7)
Eosinophils Relative: 2.2 % (ref 0.0–5.0)
HCT: 42.8 % (ref 36.0–46.0)
Hemoglobin: 14.3 g/dL (ref 12.0–15.0)
Lymphocytes Relative: 20.9 % (ref 12.0–46.0)
Lymphs Abs: 1.9 K/uL (ref 0.7–4.0)
MCHC: 33.4 g/dL (ref 30.0–36.0)
MCV: 91.4 fl (ref 78.0–100.0)
Monocytes Absolute: 0.7 K/uL (ref 0.1–1.0)
Monocytes Relative: 8 % (ref 3.0–12.0)
Neutro Abs: 6.3 K/uL (ref 1.4–7.7)
Neutrophils Relative %: 68.4 % (ref 43.0–77.0)
Platelets: 149 K/uL — ABNORMAL LOW (ref 150.0–400.0)
RBC: 4.68 Mil/uL (ref 3.87–5.11)
RDW: 12.9 % (ref 11.5–15.5)
WBC: 9.2 K/uL (ref 4.0–10.5)

## 2024-09-02 LAB — MICROALBUMIN / CREATININE URINE RATIO
Creatinine,U: 85.2 mg/dL
Microalb Creat Ratio: UNDETERMINED mg/g (ref 0.0–30.0)
Microalb, Ur: <0.7 mg/dL

## 2024-09-02 LAB — HEMOGLOBIN A1C: Hgb A1c MFr Bld: 6.7 % — ABNORMAL HIGH (ref 4.6–6.5)

## 2024-09-02 MED ORDER — GLIMEPIRIDE 1 MG PO TABS: 1.0000 mg | ORAL_TABLET | Freq: Every day | ORAL | 1 refills | Status: AC

## 2024-09-02 MED ORDER — FOLIC ACID 1 MG PO TABS: 1.0000 mg | ORAL_TABLET | Freq: Every day | ORAL | 3 refills | Status: AC

## 2024-09-02 MED ORDER — VALSARTAN-HYDROCHLOROTHIAZIDE 160-12.5 MG PO TABS: 1.0000 | ORAL_TABLET | Freq: Every day | ORAL | 1 refills | Status: AC

## 2024-09-02 MED ORDER — FENOFIBRATE 160 MG PO TABS: 160.0000 mg | ORAL_TABLET | Freq: Every day | ORAL | 1 refills | Status: AC

## 2024-09-02 MED ORDER — DAPAGLIFLOZIN PROPANEDIOL 10 MG PO TABS: 10.0000 mg | ORAL_TABLET | Freq: Every day | ORAL | 1 refills | Status: AC

## 2024-09-02 MED ORDER — OZEMPIC (1 MG/DOSE) 4 MG/3ML ~~LOC~~ SOPN: 1.0000 mg | PEN_INJECTOR | SUBCUTANEOUS | 1 refills | Status: DC

## 2024-09-02 MED ORDER — ATORVASTATIN CALCIUM 10 MG PO TABS: 10.0000 mg | ORAL_TABLET | Freq: Every day | ORAL | 1 refills | Status: AC

## 2024-09-02 MED ORDER — ESCITALOPRAM OXALATE 20 MG PO TABS: 20.0000 mg | ORAL_TABLET | Freq: Every day | ORAL | 3 refills | Status: AC

## 2024-09-02 NOTE — Assessment & Plan Note (Signed)
 Encourage heart healthy diet such as MIND or DASH diet, increase exercise, avoid trans fats, simple carbohydrates and processed foods, consider a krill or fish or flaxseed oil cap daily.

## 2024-09-02 NOTE — Assessment & Plan Note (Signed)
 hgba1c to be checked, minimize simple carbs. Increase exercise as tolerated. Continue current meds

## 2024-09-02 NOTE — Progress Notes (Signed)
 Subjective:    Patient ID: Jacqueline Duran, female    DOB: [birth year], 73 y.o.   MRN: [id number]  Chief Complaint  Patient presents with   Diabetes   Hyperlipidemia   Hypertension   Follow-up    HPI Patient is in today for f/u dm, chol and bp.  Discussed the use of AI scribe software for clinical note transcription with the patient, who gave verbal consent to proceed.  History of Present Illness Jacqueline Duran is a 73 year old female who presents for follow-up on her knee and shoulder pain.  She has a complete tear of her ACL, lateral meniscus, and a partial tear of the medial meniscus in her knee. Initially, she consulted with [facility], who reviewed her MRI and confirmed the tears. Despite therapy, she continues to experience pain, particularly in the medial meniscus area, described as a 'hang down' pain.  She experiences ongoing shoulder pain, which was evaluated by Dr. Sharl. She was diagnosed with bone-on-bone arthritis and bone spurs. She has received a cortisone shot to manage the pain temporarily, especially before a weekend trip. She was prescribed 500 mg of naproxen to take twice a day for pain management, but she prefers to use ibuprofen or lidocaine patches when possible and only takes naproxen when her pain is worse.  Her medication regimen includes valsartan , HCTZ, Ozempic , glimepiride , folic acid , fenofibrate , escitalopram , Farxiga , and atorvastatin , all of which she requests to be refilled for 90 days. She does not regularly check her blood sugar levels.  Reports a past lesion on her tongue that resolved on its own. Her family history includes her mother, who recently underwent shoulder surgery at the age of 24 and recovered well. She also mentions her husband having had hip replacements in the past.   Past Medical History:  Diagnosis Date   Colon polyps    Deafness    Right Ear   Diabetes mellitus    Hyperlipidemia    Hypertension     Past Surgical  History:  Procedure Laterality Date   ABDOMINAL HYSTERECTOMY     CERVICAL FUSION  07/2004   COLONOSCOPY  8/11   Hyperplastic polyps   TONSILLECTOMY      Family History  Problem Relation Age of Onset   Coronary artery disease Mother    GI Bleed Mother    Cancer Father        LUNG   Lung cancer Father    Clotting disorder Sister        Factor V   Factor V Leiden deficiency Sister    Ankylosing spondylitis Sister    Factor V Leiden deficiency Brother    Leukemia Maternal Uncle    Heart attack Other    Alcohol abuse Other    Stroke Other        Female <50/Female <60   Heart disease Other    Factor V Leiden deficiency Other        THROMBOEBOLISM CLOTTING DISORDER   Alcohol abuse Other     Social History   Socioeconomic History   Marital status: Widowed    Spouse name: Not on file   Number of children: Not on file   Years of education: Not on file   Highest education level: Associate degree: occupational, scientist, product/process development, or vocational program  Occupational History   Occupation: unemployed  Tobacco Use   Smoking status: Never   Smokeless tobacco: Never  Substance and Sexual Activity   Alcohol use: No  Drug use: No   Sexual activity: Not Currently    Partners: Male  Other Topics Concern   Not on file  Social History Narrative   Live at home alone   Social Drivers of Health   Financial Resource Strain: Low Risk  (07/14/2024)   Overall Financial Resource Strain (CARDIA)    Difficulty of Paying Living Expenses: Not hard at all  Food Insecurity: No Food Insecurity (07/14/2024)   Hunger Vital Sign    Worried About Running Out of Food in the Last Year: Never true    Ran Out of Food in the Last Year: Never true  Transportation Needs: No Transportation Needs (07/14/2024)   PRAPARE - Administrator, Civil Service (Medical): No    Lack of Transportation (Non-Medical): No  Physical Activity: Insufficiently Active (07/21/2024)   Exercise Vital Sign    Days of  Exercise per Week: 2 days    Minutes of Exercise per Session: 10 min  Stress: Stress Concern Present (07/21/2024)   [employer] of Occupational Health - Occupational Stress Questionnaire    Feeling of Stress: To some extent  Social Connections: Moderately Isolated (07/14/2024)   Social Connection and Isolation Panel    Frequency of Communication with Friends and Family: More than three times a week    Frequency of Social Gatherings with Friends and Family: More than three times a week    Attends Religious Services: 1 to 4 times per year    Active Member of [employer] or Organizations: No    Attends Banker Meetings: Not on file    Marital Status: Widowed  Intimate Partner Violence: Not At Risk (07/21/2024)   Humiliation, Afraid, Rape, and Kick questionnaire    Fear of Current or Ex-Partner: No    Emotionally Abused: No    Physically Abused: No    Sexually Abused: No    Outpatient Medications Prior to Visit  Medication Sig Dispense Refill   ascorbic acid (VITAMIN C) 500 MG tablet Take 1,000 mg by mouth daily.      aspirin 81 MG tablet Take 81 mg by mouth daily.       Magnesium 500 MG TABS Take 1 tablet by mouth daily.     Multiple Vitamin (MULTIVITAMIN) tablet Take 1 tablet by mouth daily.       VITAMIN E PO Take by mouth.     atorvastatin  (LIPITOR ) 10 MG tablet Take 1 tablet (10 mg total) by mouth daily. 90 tablet 1   dapagliflozin  propanediol (FARXIGA ) 10 MG TABS tablet TAKE 1 TABLET BY MOUTH EVERY DAY 90 tablet 1   escitalopram  (LEXAPRO ) 20 MG tablet Take 1 tablet (20 mg total) by mouth daily. 90 tablet 3   fenofibrate  160 MG tablet Take 1 tablet (160 mg total) by mouth daily. 90 tablet 1   folic acid  (FOLVITE ) 1 MG tablet Take 1 tablet (1 mg total) by mouth daily. 90 tablet 3   glimepiride  (AMARYL ) 1 MG tablet Take 1 tablet (1 mg total) by mouth daily. 90 tablet 1   Semaglutide , 1 MG/DOSE, (OZEMPIC , 1 MG/DOSE,) 4 MG/3ML SOPN Inject 1 mg into the skin once a week. 3 mL  3   valsartan -hydrochlorothiazide  (DIOVAN -HCT) 160-12.5 MG tablet Take 1 tablet by mouth daily. 90 tablet 1   glucose blood (ONE TOUCH ULTRA TEST) test strip Check blood sugar once daily. Dx:E11.9 (Patient not taking: Reported on 07/21/2024) 100 each 5   Insulin  Pen Needle (BD PEN NEEDLE MICRO U/F) 32G X 6  MM MISC To use w/ Ozempic  100 each 3   NURTEC 75 MG TBDP TAKE 1 TABLET BY MOUTH EVERY DAY AS NEEDED 90 tablet 1   No facility-administered medications prior to visit.    Allergies  Allergen Reactions   Amlodipine Besylate     REACTION: swelling in feet and ankles/rash/pain   Other Itching    INVOKANA     Review of Systems  Constitutional:  Negative for chills, fever and malaise/fatigue.  HENT:  Negative for congestion and hearing loss.   Eyes:  Negative for discharge.  Respiratory:  Negative for cough, sputum production and shortness of breath.   Cardiovascular:  Negative for chest pain, palpitations and leg swelling.  Gastrointestinal:  Negative for abdominal pain, blood in stool, constipation, diarrhea, heartburn, nausea and vomiting.  Genitourinary:  Negative for dysuria, frequency, hematuria and urgency.  Musculoskeletal:  Negative for back pain, falls and myalgias.  Skin:  Negative for rash.  Neurological:  Negative for dizziness, sensory change, loss of consciousness, weakness and headaches.  Endo/Heme/Allergies:  Negative for environmental allergies. Does not bruise/bleed easily.  Psychiatric/Behavioral:  Negative for depression and suicidal ideas. The patient is not nervous/anxious and does not have insomnia.        Objective:    Physical Exam Vitals and nursing note reviewed.  Constitutional:      General: She is not in acute distress.    Appearance: Normal appearance. She is well-developed.  HENT:     Head: Normocephalic and atraumatic.  Eyes:     General: No scleral icterus.       Right eye: No discharge.        Left eye: No discharge.  Cardiovascular:      Rate and Rhythm: Normal rate and regular rhythm.     Heart sounds: No murmur heard. Pulmonary:     Effort: Pulmonary effort is normal. No respiratory distress.     Breath sounds: Normal breath sounds.  Musculoskeletal:        General: Normal range of motion.     Cervical back: Normal range of motion and neck supple.     Right lower leg: No edema.     Left lower leg: No edema.  Skin:    General: Skin is warm and dry.  Neurological:     Mental Status: She is alert and oriented to person, place, and time.  Psychiatric:        Mood and Affect: Mood normal.        Behavior: Behavior normal.        Thought Content: Thought content normal.        Judgment: Judgment normal.     BP 104/68 (BP Location: Left Arm, Patient Position: Sitting, Cuff Size: Large)   Pulse 68   Temp 98.3 F (36.8 C) (Oral)   Resp 18   Ht 5' 4 (1.626 m)   Wt 240 lb 9.6 oz (109.1 kg)   SpO2 95%   BMI 41.30 kg/m  Wt Readings from Last 3 Encounters:  09/02/24 240 lb 9.6 oz (109.1 kg)  07/21/24 244 lb (110.7 kg)  03/01/24 244 lb 12.8 oz (111 kg)    Diabetic Foot Exam - Simple   No data filed    Lab Results  Component Value Date   WBC 9.5 03/01/2024   HGB 13.8 03/01/2024   HCT 41.9 03/01/2024   PLT 182.0 03/01/2024   GLUCOSE 154 (H) 03/01/2024   CHOL 122 03/01/2024   TRIG 107.0 03/01/2024   HDL  48.50 03/01/2024   LDLCALC 52 03/01/2024   ALT 15 03/01/2024   AST 18 03/01/2024   NA 139 03/01/2024   K 4.4 03/01/2024   CL 102 03/01/2024   CREATININE 0.81 03/01/2024   BUN 18 03/01/2024   CO2 30 03/01/2024   TSH 1.97 04/20/2021   HGBA1C 6.5 03/01/2024   MICROALBUR <0.7 03/01/2024    Lab Results  Component Value Date   TSH 1.97 04/20/2021   Lab Results  Component Value Date   WBC 9.5 03/01/2024   HGB 13.8 03/01/2024   HCT 41.9 03/01/2024   MCV 94.1 03/01/2024   PLT 182.0 03/01/2024   Lab Results  Component Value Date   NA 139 03/01/2024   K 4.4 03/01/2024   CO2 30 03/01/2024    GLUCOSE 154 (H) 03/01/2024   BUN 18 03/01/2024   CREATININE 0.81 03/01/2024   BILITOT 0.5 03/01/2024   ALKPHOS 75 03/01/2024   AST 18 03/01/2024   ALT 15 03/01/2024   PROT 6.6 03/01/2024   ALBUMIN 3.9 03/01/2024   CALCIUM  9.2 03/01/2024   GFR 73.49 03/01/2024   Lab Results  Component Value Date   CHOL 122 03/01/2024   Lab Results  Component Value Date   HDL 48.50 03/01/2024   Lab Results  Component Value Date   LDLCALC 52 03/01/2024   Lab Results  Component Value Date   TRIG 107.0 03/01/2024   Lab Results  Component Value Date   CHOLHDL 3 03/01/2024   Lab Results  Component Value Date   HGBA1C 6.5 03/01/2024       Assessment & Plan:  Type 2 diabetes mellitus with hyperglycemia, without long-term current use of insulin  (HCC) -     Lipid panel -     CBC with Differential/Platelet -     Comprehensive metabolic panel with GFR -     Hemoglobin A1c -     Microalbumin / creatinine urine ratio -     Dapagliflozin  Propanediol; Take 1 tablet (10 mg total) by mouth daily.  Dispense: 90 tablet; Refill: 1 -     Ozempic  (1 MG/DOSE); Inject 1 mg into the skin once a week.  Dispense: 9 mL; Refill: 1  Primary hypertension -     Lipid panel -     CBC with Differential/Platelet -     Comprehensive metabolic panel with GFR -     Dapagliflozin  Propanediol; Take 1 tablet (10 mg total) by mouth daily.  Dispense: 90 tablet; Refill: 1  Hyperlipidemia, unspecified hyperlipidemia type -     Lipid panel -     Comprehensive metabolic panel with GFR -     Atorvastatin  Calcium ; Take 1 tablet (10 mg total) by mouth daily.  Dispense: 90 tablet; Refill: 1 -     Fenofibrate ; Take 1 tablet (160 mg total) by mouth daily.  Dispense: 90 tablet; Refill: 1  Hyperlipidemia LDL goal <70 -     Atorvastatin  Calcium ; Take 1 tablet (10 mg total) by mouth daily.  Dispense: 90 tablet; Refill: 1  Generalized anxiety disorder -     Escitalopram  Oxalate; Take 1 tablet (20 mg total) by mouth daily.   Dispense: 90 tablet; Refill: 3  Hyperlipidemia associated with type 2 diabetes mellitus (HCC) -     Fenofibrate ; Take 1 tablet (160 mg total) by mouth daily.  Dispense: 90 tablet; Refill: 1  Elevated homocysteine -     Folic Acid ; Take 1 tablet (1 mg total) by mouth daily.  Dispense: 90 tablet; Refill:  3  Type 2 diabetes mellitus with diabetic neuropathy, without long-term current use of insulin  (HCC) -     Glimepiride ; Take 1 tablet (1 mg total) by mouth daily.  Dispense: 90 tablet; Refill: 1  Essential hypertension -     Valsartan -hydroCHLOROthiazide ; Take 1 tablet by mouth daily.  Dispense: 90 tablet; Refill: 1   Assessment and Plan Assessment & Plan Right knee ACL and meniscus tears   She has a complete tear of the ACL, lateral meniscus, and a partial tear of the medial meniscus in the right knee, with persistent pain in the medial meniscus area. Proceed with arthroscopic surgery to trim the medial meniscus. Plan for two weeks off work post-surgery with possible light duty thereafter.  Right shoulder osteoarthritis with bone spurs   Chronic osteoarthritis in the right shoulder with bone spurs is causing pain. It is managed with naproxen and cortisone injections, with a preference for ibuprofen or lidocaine patches for pain management. Cortisone injections are limited to three per year due to potential side effects. Continue naproxen 500 mg twice daily as needed for pain. Consider ibuprofen or lidocaine patches. Plan for follow-up in December to discuss further management options, including potential surgery.  Type 2 diabetes mellitus with neuropathy and hyperglycemia   She has type 2 diabetes with associated neuropathy and hyperglycemia. There is no current self-monitoring of blood glucose levels. Medications include Ozempic , glimepiride , and Farxiga . Refill Ozempic , glimepiride , and Farxiga  for 90 days.  Essential hypertension   Chronic essential hypertension is managed with valsartan   HCT. Refill valsartan  HCT for 90 days.  Hyperlipidemia   Chronic hyperlipidemia is managed with atorvastatin . Refill atorvastatin  for 90 days.  Generalized anxiety disorder   Generalized anxiety disorder is managed with escitalopram . She reports stress related to work but has no desire to discontinue medication. Refill escitalopram  for 90 days.   Jose Corvin R Lowne Chase, DO

## 2024-09-02 NOTE — Assessment & Plan Note (Signed)
 Well controlled, no changes to meds. Encouraged heart healthy diet such as the DASH diet and exercise as tolerated.

## 2024-09-02 NOTE — Assessment & Plan Note (Signed)
 Cont diet and exercise

## 2024-09-06 ENCOUNTER — Encounter: Admitting: Family Medicine

## 2024-11-10 ENCOUNTER — Encounter: Payer: Self-pay | Admitting: Family Medicine

## 2024-11-10 DIAGNOSIS — E1165 Type 2 diabetes mellitus with hyperglycemia: Secondary | ICD-10-CM

## 2024-11-10 MED ORDER — OZEMPIC (1 MG/DOSE) 4 MG/3ML ~~LOC~~ SOPN: 1.0000 mg | PEN_INJECTOR | SUBCUTANEOUS | 1 refills | Status: AC

## 2025-07-27 ENCOUNTER — Ambulatory Visit
